# Patient Record
Sex: Male | Born: 1945
Health system: Southern US, Community
[De-identification: ages and names within clinical notes are randomized; demographics above are authoritative.]

## PROBLEM LIST (undated history)

## (undated) DIAGNOSIS — R972 Elevated prostate specific antigen [PSA]: Secondary | ICD-10-CM

## (undated) DIAGNOSIS — M1990 Unspecified osteoarthritis, unspecified site: Secondary | ICD-10-CM

## (undated) DIAGNOSIS — R002 Palpitations: Secondary | ICD-10-CM

## (undated) DIAGNOSIS — R001 Bradycardia, unspecified: Secondary | ICD-10-CM

## (undated) DIAGNOSIS — K449 Diaphragmatic hernia without obstruction or gangrene: Secondary | ICD-10-CM

## (undated) DIAGNOSIS — Z9889 Other specified postprocedural states: Secondary | ICD-10-CM

## (undated) DIAGNOSIS — K219 Gastro-esophageal reflux disease without esophagitis: Secondary | ICD-10-CM

## (undated) DIAGNOSIS — I209 Angina pectoris, unspecified: Secondary | ICD-10-CM

## (undated) DIAGNOSIS — I82409 Acute embolism and thrombosis of unspecified deep veins of unspecified lower extremity: Secondary | ICD-10-CM

## (undated) DIAGNOSIS — E785 Hyperlipidemia, unspecified: Secondary | ICD-10-CM

## (undated) DIAGNOSIS — D649 Anemia, unspecified: Secondary | ICD-10-CM

## (undated) DIAGNOSIS — R112 Nausea with vomiting, unspecified: Secondary | ICD-10-CM

## (undated) DIAGNOSIS — R51 Headache: Secondary | ICD-10-CM

## (undated) DIAGNOSIS — C801 Malignant (primary) neoplasm, unspecified: Secondary | ICD-10-CM

## (undated) DIAGNOSIS — IMO0001 Reserved for inherently not codable concepts without codable children: Secondary | ICD-10-CM

## (undated) DIAGNOSIS — L299 Pruritus, unspecified: Secondary | ICD-10-CM

## (undated) DIAGNOSIS — I2699 Other pulmonary embolism without acute cor pulmonale: Secondary | ICD-10-CM

## (undated) DIAGNOSIS — I341 Nonrheumatic mitral (valve) prolapse: Secondary | ICD-10-CM

## (undated) DIAGNOSIS — I2510 Atherosclerotic heart disease of native coronary artery without angina pectoris: Secondary | ICD-10-CM

## (undated) DIAGNOSIS — R519 Headache, unspecified: Secondary | ICD-10-CM

## (undated) DIAGNOSIS — Z8679 Personal history of other diseases of the circulatory system: Secondary | ICD-10-CM

## (undated) DIAGNOSIS — I10 Essential (primary) hypertension: Secondary | ICD-10-CM

## (undated) HISTORY — DX: Hyperlipidemia, unspecified: E78.5

## (undated) HISTORY — DX: Diaphragmatic hernia without obstruction or gangrene: K44.9

## (undated) HISTORY — DX: Nonrheumatic mitral (valve) prolapse: I34.1

## (undated) HISTORY — PX: TONSILLECTOMY AND ADENOIDECTOMY: SUR1326

## (undated) HISTORY — DX: Pruritus, unspecified: L29.9

## (undated) HISTORY — DX: Elevated prostate specific antigen (PSA): R97.20

## (undated) HISTORY — DX: Palpitations: R00.2

## (undated) HISTORY — DX: Anemia, unspecified: D64.9

## (undated) HISTORY — PX: CYSTOSCOPY: SUR368

## (undated) HISTORY — PX: OTHER SURGICAL HISTORY: SHX169

## (undated) HISTORY — PX: SHOULDER ARTHROSCOPY: SHX128

## (undated) HISTORY — DX: Gastro-esophageal reflux disease without esophagitis: K21.9

## (undated) HISTORY — DX: Bradycardia, unspecified: R00.1

## (undated) HISTORY — DX: Personal history of other diseases of the circulatory system: Z86.79

## (undated) HISTORY — DX: Atherosclerotic heart disease of native coronary artery without angina pectoris: I25.10

## (undated) HISTORY — PX: COLONOSCOPY: SHX174

---

## 1999-04-20 ENCOUNTER — Ambulatory Visit (HOSPITAL_COMMUNITY): Admission: RE | Admit: 1999-04-20 | Discharge: 1999-04-20 | Payer: Self-pay | Admitting: Gastroenterology

## 1999-05-12 ENCOUNTER — Encounter: Payer: Self-pay | Admitting: Internal Medicine

## 1999-05-12 ENCOUNTER — Ambulatory Visit (HOSPITAL_COMMUNITY): Admission: RE | Admit: 1999-05-12 | Discharge: 1999-05-12 | Payer: Self-pay | Admitting: Internal Medicine

## 2000-02-18 ENCOUNTER — Ambulatory Visit (HOSPITAL_COMMUNITY): Admission: RE | Admit: 2000-02-18 | Discharge: 2000-02-18 | Payer: Self-pay

## 2000-05-28 ENCOUNTER — Ambulatory Visit (HOSPITAL_COMMUNITY): Admission: RE | Admit: 2000-05-28 | Discharge: 2000-05-28 | Payer: Self-pay

## 2001-03-20 ENCOUNTER — Encounter: Admission: RE | Admit: 2001-03-20 | Discharge: 2001-03-20 | Payer: Self-pay | Admitting: Gastroenterology

## 2001-03-20 ENCOUNTER — Encounter: Payer: Self-pay | Admitting: Gastroenterology

## 2001-03-27 ENCOUNTER — Ambulatory Visit (HOSPITAL_COMMUNITY): Admission: RE | Admit: 2001-03-27 | Discharge: 2001-03-27 | Payer: Self-pay | Admitting: Gastroenterology

## 2001-03-27 ENCOUNTER — Encounter (INDEPENDENT_AMBULATORY_CARE_PROVIDER_SITE_OTHER): Payer: Self-pay | Admitting: *Deleted

## 2001-08-21 ENCOUNTER — Encounter (INDEPENDENT_AMBULATORY_CARE_PROVIDER_SITE_OTHER): Payer: Self-pay | Admitting: Specialist

## 2001-08-21 ENCOUNTER — Ambulatory Visit (HOSPITAL_COMMUNITY): Admission: RE | Admit: 2001-08-21 | Discharge: 2001-08-21 | Payer: Self-pay | Admitting: Gastroenterology

## 2001-09-19 ENCOUNTER — Ambulatory Visit (HOSPITAL_COMMUNITY): Admission: RE | Admit: 2001-09-19 | Discharge: 2001-09-19 | Payer: Self-pay | Admitting: Cardiology

## 2001-09-19 ENCOUNTER — Encounter: Payer: Self-pay | Admitting: Cardiology

## 2002-06-10 ENCOUNTER — Encounter: Payer: Self-pay | Admitting: Internal Medicine

## 2002-06-10 ENCOUNTER — Ambulatory Visit (HOSPITAL_COMMUNITY): Admission: RE | Admit: 2002-06-10 | Discharge: 2002-06-10 | Payer: Self-pay | Admitting: Internal Medicine

## 2003-05-13 HISTORY — PX: HUMERUS FRACTURE SURGERY: SHX670

## 2004-09-02 ENCOUNTER — Ambulatory Visit (HOSPITAL_COMMUNITY): Admission: RE | Admit: 2004-09-02 | Discharge: 2004-09-02 | Payer: Self-pay | Admitting: Gastroenterology

## 2004-10-26 ENCOUNTER — Ambulatory Visit: Payer: Self-pay | Admitting: Cardiology

## 2004-11-02 ENCOUNTER — Ambulatory Visit: Payer: Self-pay | Admitting: Cardiology

## 2004-11-02 ENCOUNTER — Inpatient Hospital Stay (HOSPITAL_COMMUNITY): Admission: AD | Admit: 2004-11-02 | Discharge: 2004-11-04 | Payer: Self-pay | Admitting: *Deleted

## 2004-11-02 ENCOUNTER — Ambulatory Visit: Payer: Self-pay

## 2004-11-03 ENCOUNTER — Ambulatory Visit: Payer: Self-pay | Admitting: *Deleted

## 2004-11-09 ENCOUNTER — Ambulatory Visit: Payer: Self-pay

## 2004-11-30 ENCOUNTER — Ambulatory Visit: Payer: Self-pay | Admitting: Cardiology

## 2005-01-17 ENCOUNTER — Ambulatory Visit: Payer: Self-pay | Admitting: Cardiology

## 2005-02-01 ENCOUNTER — Ambulatory Visit: Payer: Self-pay | Admitting: Cardiology

## 2005-03-03 ENCOUNTER — Ambulatory Visit: Payer: Self-pay | Admitting: Cardiology

## 2005-03-22 ENCOUNTER — Ambulatory Visit: Payer: Self-pay | Admitting: Cardiology

## 2005-05-24 ENCOUNTER — Ambulatory Visit: Payer: Self-pay | Admitting: Internal Medicine

## 2005-06-21 ENCOUNTER — Ambulatory Visit: Payer: Self-pay | Admitting: Cardiology

## 2005-07-12 ENCOUNTER — Ambulatory Visit: Payer: Self-pay | Admitting: Cardiology

## 2005-10-04 ENCOUNTER — Ambulatory Visit: Payer: Self-pay | Admitting: Cardiology

## 2005-10-25 ENCOUNTER — Encounter: Admission: RE | Admit: 2005-10-25 | Discharge: 2005-10-25 | Payer: Self-pay | Admitting: Gastroenterology

## 2006-01-03 ENCOUNTER — Ambulatory Visit (HOSPITAL_COMMUNITY): Admission: RE | Admit: 2006-01-03 | Discharge: 2006-01-03 | Payer: Self-pay | Admitting: Gastroenterology

## 2006-01-03 ENCOUNTER — Encounter (INDEPENDENT_AMBULATORY_CARE_PROVIDER_SITE_OTHER): Payer: Self-pay | Admitting: Specialist

## 2006-02-28 ENCOUNTER — Ambulatory Visit: Payer: Self-pay | Admitting: Cardiology

## 2006-08-01 ENCOUNTER — Ambulatory Visit: Payer: Self-pay | Admitting: Cardiology

## 2007-02-13 ENCOUNTER — Ambulatory Visit: Payer: Self-pay | Admitting: Cardiology

## 2007-02-13 LAB — CONVERTED CEMR LAB
BUN: 14 mg/dL (ref 6–23)
Basophils Absolute: 0.1 10*3/uL (ref 0.0–0.1)
Basophils Relative: 1.8 % — ABNORMAL HIGH (ref 0.0–1.0)
CO2: 31 meq/L (ref 19–32)
Chloride: 108 meq/L (ref 96–112)
Eosinophils Absolute: 0.3 10*3/uL (ref 0.0–0.6)
Glucose, Bld: 98 mg/dL (ref 70–99)
HCT: 38.3 % — ABNORMAL LOW (ref 39.0–52.0)
Hemoglobin: 13.2 g/dL (ref 13.0–17.0)
Homocysteine: 11.4 micromoles/L (ref 5.00–13.90)
Monocytes Relative: 8.9 % (ref 3.0–11.0)
Neutro Abs: 3.3 10*3/uL (ref 1.4–7.7)
Neutrophils Relative %: 65.9 % (ref 43.0–77.0)
Platelets: 238 10*3/uL (ref 150–400)
Potassium: 3.9 meq/L (ref 3.5–5.1)
RBC: 4.38 M/uL (ref 4.22–5.81)
RDW: 12.5 % (ref 11.5–14.6)
Total Bilirubin: 0.8 mg/dL (ref 0.3–1.2)

## 2007-02-20 ENCOUNTER — Ambulatory Visit: Payer: Self-pay | Admitting: Cardiology

## 2007-03-01 ENCOUNTER — Ambulatory Visit: Payer: Self-pay | Admitting: Cardiology

## 2007-03-01 LAB — CONVERTED CEMR LAB
BUN: 15 mg/dL (ref 6–23)
Basophils Absolute: 0 10*3/uL (ref 0.0–0.1)
CO2: 31 meq/L (ref 19–32)
Eosinophils Relative: 4.4 % (ref 0.0–5.0)
GFR calc Af Amer: 79 mL/min
Glucose, Bld: 108 mg/dL — ABNORMAL HIGH (ref 70–99)
HCT: 39.2 % (ref 39.0–52.0)
Hemoglobin: 13.6 g/dL (ref 13.0–17.0)
INR: 0.9 (ref 0.8–1.0)
Lymphocytes Relative: 17 % (ref 12.0–46.0)
MCV: 86.6 fL (ref 78.0–100.0)
Monocytes Relative: 10.7 % (ref 3.0–11.0)
Neutro Abs: 3.5 10*3/uL (ref 1.4–7.7)
Neutrophils Relative %: 67.8 % (ref 43.0–77.0)
Potassium: 3.8 meq/L (ref 3.5–5.1)
Prothrombin Time: 11.4 s (ref 10.9–13.3)
Sodium: 144 meq/L (ref 135–145)
aPTT: 28.4 s (ref 21.7–29.8)

## 2007-03-06 ENCOUNTER — Ambulatory Visit: Payer: Self-pay | Admitting: Cardiology

## 2007-03-06 ENCOUNTER — Inpatient Hospital Stay (HOSPITAL_BASED_OUTPATIENT_CLINIC_OR_DEPARTMENT_OTHER): Admission: RE | Admit: 2007-03-06 | Discharge: 2007-03-06 | Payer: Self-pay | Admitting: Cardiology

## 2007-04-03 ENCOUNTER — Ambulatory Visit: Payer: Self-pay | Admitting: Cardiology

## 2007-05-15 ENCOUNTER — Encounter: Admission: RE | Admit: 2007-05-15 | Discharge: 2007-05-15 | Payer: Self-pay | Admitting: Gastroenterology

## 2007-05-15 ENCOUNTER — Encounter: Payer: Self-pay | Admitting: Internal Medicine

## 2007-05-22 ENCOUNTER — Encounter: Payer: Self-pay | Admitting: Internal Medicine

## 2007-05-22 ENCOUNTER — Ambulatory Visit (HOSPITAL_COMMUNITY): Admission: RE | Admit: 2007-05-22 | Discharge: 2007-05-22 | Payer: Self-pay | Admitting: Gastroenterology

## 2007-05-29 ENCOUNTER — Encounter: Payer: Self-pay | Admitting: Internal Medicine

## 2007-10-23 ENCOUNTER — Ambulatory Visit: Payer: Self-pay | Admitting: Cardiology

## 2007-12-18 ENCOUNTER — Encounter: Payer: Self-pay | Admitting: Internal Medicine

## 2008-06-24 ENCOUNTER — Ambulatory Visit: Payer: Self-pay | Admitting: Cardiology

## 2008-06-24 LAB — CONVERTED CEMR LAB
ALT: 31 units/L (ref 0–53)
Albumin: 4.1 g/dL (ref 3.5–5.2)
Bilirubin, Direct: 0.1 mg/dL (ref 0.0–0.3)
CRP, High Sensitivity: <1 — ABNORMAL LOW (ref 0.00–5.00)
Calcium: 9.3 mg/dL (ref 8.4–10.5)
Chloride: 105 meq/L (ref 96–112)
Eosinophils Absolute: 0.2 10*3/uL (ref 0.0–0.7)
Eosinophils Relative: 4.8 % (ref 0.0–5.0)
GFR calc Af Amer: 87 mL/min
GFR calc non Af Amer: 72 mL/min
HCT: 38.2 % — ABNORMAL LOW (ref 39.0–52.0)
LDL Cholesterol: 64 mg/dL (ref 0–99)
MCHC: 34.2 g/dL (ref 30.0–36.0)
Monocytes Relative: 8.3 % (ref 3.0–12.0)
PSA: 4.68 ng/mL — ABNORMAL HIGH (ref 0.10–4.00)
Platelets: 214 10*3/uL (ref 150–400)
RBC: 4.36 M/uL (ref 4.22–5.81)
Sodium: 141 meq/L (ref 135–145)
Total Protein: 6 g/dL (ref 6.0–8.3)
WBC: 4.5 10*3/uL (ref 4.5–10.5)

## 2008-07-01 ENCOUNTER — Ambulatory Visit (HOSPITAL_COMMUNITY): Admission: RE | Admit: 2008-07-01 | Discharge: 2008-07-01 | Payer: Self-pay | Admitting: Gastroenterology

## 2008-07-01 ENCOUNTER — Encounter (INDEPENDENT_AMBULATORY_CARE_PROVIDER_SITE_OTHER): Payer: Self-pay | Admitting: Gastroenterology

## 2008-07-01 ENCOUNTER — Encounter: Payer: Self-pay | Admitting: Internal Medicine

## 2008-09-03 ENCOUNTER — Encounter: Payer: Self-pay | Admitting: Cardiology

## 2008-10-21 ENCOUNTER — Encounter: Payer: Self-pay | Admitting: Internal Medicine

## 2008-11-07 DIAGNOSIS — Z9089 Acquired absence of other organs: Secondary | ICD-10-CM | POA: Insufficient documentation

## 2008-11-07 DIAGNOSIS — I059 Rheumatic mitral valve disease, unspecified: Secondary | ICD-10-CM | POA: Insufficient documentation

## 2008-11-07 DIAGNOSIS — K219 Gastro-esophageal reflux disease without esophagitis: Secondary | ICD-10-CM | POA: Insufficient documentation

## 2008-11-07 DIAGNOSIS — Z862 Personal history of diseases of the blood and blood-forming organs and certain disorders involving the immune mechanism: Secondary | ICD-10-CM | POA: Insufficient documentation

## 2008-11-07 DIAGNOSIS — J309 Allergic rhinitis, unspecified: Secondary | ICD-10-CM | POA: Insufficient documentation

## 2008-11-07 DIAGNOSIS — Z8679 Personal history of other diseases of the circulatory system: Secondary | ICD-10-CM | POA: Insufficient documentation

## 2008-11-07 DIAGNOSIS — E785 Hyperlipidemia, unspecified: Secondary | ICD-10-CM | POA: Insufficient documentation

## 2008-11-10 DIAGNOSIS — I2510 Atherosclerotic heart disease of native coronary artery without angina pectoris: Secondary | ICD-10-CM | POA: Insufficient documentation

## 2008-11-10 DIAGNOSIS — I209 Angina pectoris, unspecified: Secondary | ICD-10-CM | POA: Insufficient documentation

## 2008-11-11 ENCOUNTER — Ambulatory Visit: Payer: Self-pay | Admitting: Cardiology

## 2009-08-18 ENCOUNTER — Encounter: Payer: Self-pay | Admitting: Internal Medicine

## 2009-08-18 ENCOUNTER — Ambulatory Visit (HOSPITAL_COMMUNITY): Admission: RE | Admit: 2009-08-18 | Discharge: 2009-08-18 | Payer: Self-pay | Admitting: Gastroenterology

## 2009-08-27 ENCOUNTER — Encounter: Payer: Self-pay | Admitting: Internal Medicine

## 2009-08-27 ENCOUNTER — Ambulatory Visit (HOSPITAL_COMMUNITY): Admission: RE | Admit: 2009-08-27 | Discharge: 2009-08-27 | Payer: Self-pay | Admitting: Gastroenterology

## 2009-11-16 ENCOUNTER — Telehealth: Payer: Self-pay | Admitting: Cardiology

## 2010-01-26 ENCOUNTER — Ambulatory Visit: Payer: Self-pay | Admitting: Cardiology

## 2010-01-27 ENCOUNTER — Encounter: Payer: Self-pay | Admitting: Cardiology

## 2010-07-03 ENCOUNTER — Encounter: Payer: Self-pay | Admitting: Gastroenterology

## 2010-07-08 ENCOUNTER — Ambulatory Visit
Admission: RE | Admit: 2010-07-08 | Discharge: 2010-07-08 | Payer: Self-pay | Source: Home / Self Care | Attending: Internal Medicine | Admitting: Internal Medicine

## 2010-07-08 ENCOUNTER — Encounter: Payer: Self-pay | Admitting: Internal Medicine

## 2010-07-08 ENCOUNTER — Other Ambulatory Visit: Payer: Self-pay | Admitting: Internal Medicine

## 2010-07-08 LAB — LIPID PANEL
Cholesterol: 135 mg/dL (ref 0–200)
HDL: 48.1 mg/dL (ref >39.00)
VLDL: 11.6 mg/dL (ref 0.0–40.0)

## 2010-07-08 LAB — CBC WITH DIFFERENTIAL/PLATELET
Basophils Relative: 0 % (ref 0.0–3.0)
Eosinophils Absolute: 0 10*3/uL (ref 0.0–0.7)
Eosinophils Relative: 0 % (ref 0.0–5.0)
Lymphs Abs: 0.6 10*3/uL — ABNORMAL LOW (ref 0.7–4.0)
MCHC: 33.3 g/dL (ref 30.0–36.0)
MCV: 89.8 fl (ref 78.0–100.0)
Monocytes Absolute: 0.6 10*3/uL (ref 0.1–1.0)
Platelets: 241 10*3/uL (ref 150.0–400.0)
RBC: 4.52 Mil/uL (ref 4.22–5.81)
RDW: 13.7 % (ref 11.5–14.6)

## 2010-07-08 LAB — BASIC METABOLIC PANEL
CO2: 26 mEq/L (ref 19–32)
Calcium: 9.5 mg/dL (ref 8.4–10.5)

## 2010-07-11 LAB — CONVERTED CEMR LAB: PSA: 1.41 ng/mL (ref <=4.00)

## 2010-07-14 NOTE — Assessment & Plan Note (Signed)
Summary: per check out  Medications Added FLOMAX 0.4 MG CAPS (TAMSULOSIN HCL) 1 tab once daily      Allergies Added:   Visit Type:  Follow-up Primary Provider:  norins  CC:  occ chest pain.  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  he has very mild nonobstructive CAD documented at catheterization a few years ago with 30% narrowing in the proximal LAD. He has fairly typical exertional chest pain. When he walks on a treadmill he knows just what speed and incline will bring on pain and he generally keeps his level of activity just below this. He does not feel his symptoms have changed over the past year they have gotten a bit better. He's going to adjust his symptoms very well and does not appear to be limited in the things that he wants to do.  He also has hyperlipidemia with low HDL. In January of 2010 his HDL was 28.  He's been intolerant to nitrates beta blockers and calcium channel blockers. We have discussed Ranexa but because his symptoms are not disabling we have not decided to use this.  He has a son who will be entering medical school in [facility] [employer] [facility] next year.  Current Medications (verified): 1)  Vytorin 10-40 Mg Tabs (Ezetimibe-Simvastatin) .... Take One Tablet By Mouth Dailyat Bedtime 2)  Allegra 180 Mg Tabs (Fexofenadine Hcl) 3)  Zegerid 40-1100 Mg Caps (Omeprazole-Sodium Bicarbonate) .Marland Kitchen.. 1 Tab Two Times A Day 4)  Prozac 10 Mg Tabs (Fluoxetine Hcl) .Marland Kitchen.. 1 Tab Once Daily 5)  Flomax 0.4 Mg Caps (Tamsulosin Hcl) .Marland Kitchen.. 1 Tab Once Daily  Allergies (verified): 1)  ! Pcn  Past History:  Past Medical History: Reviewed history from 11/10/2008 and no changes required. * MICROVASCULAR ANGINA GERD (ICD-530.81) PRURITUS, HX OF (ICD-698.9) RHEUMATIC FEVER, HX OF QUESTION (ICD-V12.59) PSA, INCREASED, HX OF (ICD-790.93) ANEMIA, HX OF (ICD-V12.3) ALLERGIC RHINITIS (ICD-477.9) PALPITATIONS, HX OF  (ICD-V12.50) MITRAL VALVE PROLAPSE (ICD-424.0) HIATAL HERNIA, HX OF SMALL (ICD-V12.79) HYPERLIPIDEMIA (ICD-272.4) 1. Microvascular angina, improved. 2. Minimal nonobstructive coronary disease. 3. Good left ventricular function. 4. Hyperlipidemia with low HDL and small LDL particle size.     Review of Systems       ROS is negative except as outlined in HPI.   Vital Signs:  Patient profile:   65 year old male Height:      68 inches Weight:      181 pounds BMI:     27.62 Pulse rate:   53 / minute BP sitting:   119 / 77  (left arm) Cuff size:   regular  Vitals Entered By: Burnett Kanaris, CNA (January 26, 2010 4:15 PM)  Physical Exam  Additional Exam:  Gen. Well-nourished, in no distress   Neck: No JVD, thyroid not enlarged, no carotid bruits Lungs: No tachypnea, clear without rales, rhonchi or wheezes Cardiovascular: Rhythm regular, PMI not displaced,  heart sounds  normal, no murmurs or gallops, no peripheral edema, pulses normal in all 4 extremities. Abdomen: BS normal, abdomen soft and non-tender without masses or organomegaly, no hepatosplenomegaly. MS: No deformities, no cyanosis or clubbing   Neuro:  No focal sns   Skin:  no lesions    Impression & Recommendations:  Problem # 1:  OTHER AND UNSPECIFIED ANGINA PECTORIS (ICD-413.9) He has microvascular angina.  Sx stable and not disabling.  No change in Rx. He has dyspnea with exertion as well as CP and mild edema  LE.  Will get d-dimer.  Problem # 2:  HYPERLIPIDEMIA (ICD-272.4) He has low HDL.  Will get lipid profile. His updated medication list for this problem includes:    Vytorin 10-40 Mg Tabs (Ezetimibe-simvastatin) .Marland Kitchen... Take one tablet by mouth dailyat bedtime  Problem # 3:  CAD, NATIVE VESSEL (ICD-414.01) He has mild non-obstructive disease.  Patient Instructions: 1)  Your physician recommends that you return for FASTING lab work on a day that is good for you: cbc/bmet/lipid/liver/PSA (bound & free)/ d-dimer  (414.01;272.2) 2)  Your physician recommends that you continue on your current medications as directed. Please refer to the Current Medication list given to you today. 3)  Your physician wants you to follow-up in: 1 year with Dr. Gala Romney. You will receive a reminder letter in the mail two months in advance. If you don't receive a letter, please call our office to schedule the follow-up appointment.

## 2010-07-14 NOTE — Progress Notes (Signed)
Summary: med refill done x2    Phone Note Refill Request Message from:  Patient on November 16, 2009 9:18 AM  Refills Requested: Medication #1:  VYTORIN 10-40 MG TABS Take one tablet by mouth dailyat bedtime Please resend to [facility] pt states [facility] didnt get it   Method Requested: Fax to [facility] Initial call taken by: Edman Circle,  November 16, 2009 9:18 AM    Prescriptions: VYTORIN 10-40 MG TABS (EZETIMIBE-SIMVASTATIN) Take one tablet by mouth dailyat bedtime  #90 x 4   Entered by:   Burnett Kanaris, CNA   Authorized by:   Lenoria Farrier, MD, [facility]   Signed by:   Burnett Kanaris, CNA on 11/16/2009   Method used:   Electronically to        [facility]* (mail-order)             ,          Ph: [phone]       Fax: [phone]   RxID:   [id number]

## 2010-07-14 NOTE — Letter (Signed)
Summary: [facility]   Imported By: Sherian Rein 08/30/2009 09:59:34  _____________________________________________________________________  External Attachment:    Type:   Image     Comment:   External Document

## 2010-07-27 ENCOUNTER — Other Ambulatory Visit: Payer: Self-pay

## 2010-08-15 ENCOUNTER — Telehealth (INDEPENDENT_AMBULATORY_CARE_PROVIDER_SITE_OTHER): Payer: Self-pay | Admitting: *Deleted

## 2010-08-23 NOTE — Progress Notes (Signed)
Summary: vytorin refill   Phone Note Other Incoming   Caller: patient Summary of Call: J Maricruz Lucero RN  Follow-up for Phone Call        Patient called and asked if we could renew his Vytorin to the [facility]. Will send in script. He was concerned about his follow up since Dr.Brodie retired and he was assigned to Dr.Dan who will be working in the CHF clinic at [employer]. He would like to be assigned to Dr. Excell Seltzer for his yearly visit in August . Dr.Dan is OK with this. Will ask [facility] and call patient back at [phone].  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 Will change call back to Dr.Cooper for August.   Layne Benton, RN, BSN  August 17, 2010 12:45 PM     Prescriptions: VYTORIN 10-40 MG TABS (EZETIMIBE-SIMVASTATIN) Take one tablet by mouth dailyat bedtime  #90 x 3   Entered by:   Layne Benton, RN, BSN   Authorized by:   Dolores Patty, MD, [facility]   Signed by:   Layne Benton, RN, BSN on 08/15/2010   Method used:   Electronically to        [facility]* (retail)       [address]       [address] Shipping/mailing       [place], [place]  [address]       Ph: [phone]       Fax: [phone]   RxID:   [id number]

## 2010-10-25 NOTE — Op Note (Signed)
NAME:  Nathaniel [name], HEACOCK NO.:  [id number]   MEDICAL RECORD NO.:  [id number]          PATIENT TYPE:  AMB   LOCATION:  ENDO                         FACILITY:  [facility]   PHYSICIAN:  [name], M.D.   DATE OF BIRTH:  1945/10/22   DATE OF PROCEDURE:  07/01/2008  DATE OF DISCHARGE:                               OPERATIVE REPORT   PROCEDURE:  Upper endoscopy with biopsies.   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  He has a fair amount of LPR symptoms such as throat  clearing and intermittent hoarseness, but also a new sensation of a  burning sensation coming up into his proximal esophagus or throat, even  though he is not having overt mechanical regurgitation.   FINDINGS:  Probable moderate reflux laryngitis.  Esophageal mucosa  intact, but questionable short-segment Barrett esophagus versus small  hiatal hernia.   PROCEDURE:  The nature, purpose, and risks of the procedure were  familiar to the patient from prior examinations and he provided written  consent.  Sedation was Demerol 75 mg and Versed 5 mg IV without clinical  instability.  Note that Demerol was used instead of fentanyl because he  has a history of nausea and vomiting after exposure to fentanyl in the  past, and has done well with Demerol for endoscopic procedures  previously.   The Pentax video endoscope was passed under direct vision.  The larynx  looked somewhat abnormal, and there did appear to be edema of the  arytenoid cartilages and posterior commissure.   The esophagus was entered easily under direct vision.   The esophageal mucosa was normal all the way down to the squamocolumnar  junction, below which was what appeared to be a small (2 cm)  hiatal  hernia.  Gastric folds seemed to have continuity into this area  suggesting it was a hiatal hernia, but there was some very slight  irregularity to the Z-line raising the question as to whether or not  this could reflect short-segment Barrett esophagus, so I took biopsies  circumferentially just below the squamocolumnar junction.   No esophageal ring or stricture was evident at this time and I did not  see any free reflux occurring during this exam.   The stomach was entered.  It contained no residual.  There was some  faint prepyloric antral erythema without any erosive changes nor any  ulcers, masses, polyps, or vascular ectasia anywhere in the stomach  including a retroflex view of the cardia, which did not show any obvious  hiatal hernia.  The pylorus, duodenal bulb, and second duodenum looked  normal.   The scope was then removed from the patient who tolerated the procedure  well and without any apparent complication.   IMPRESSION:  1. Probable reflux laryngitis.  2. No active esophagitis noted.  3. Possible short-segment Barrett esophagus versus small hiatal      hernia.  PLAN:  Await pathology results to see if they clarify the picture  histologically as to whether or not this patient has Barrett esophagus.  At some point, the patient may need consideration of 24-hour impedance  monitoring, 48-hour intra-esophageal pH monitoring while on medication,  and/or referral to a medical center.           ______________________________  [name], M.D.     RB/MEDQ  D:  07/01/2008  T:  07/01/2008  Job:  [id number]   cc:   Rosalyn Gess. Norins, MD

## 2010-10-25 NOTE — Assessment & Plan Note (Signed)
[facility]                            CARDIOLOGY OFFICE NOTE   NAME:[name], [name]                    MRN:          [id number]  DATE:10/23/2007                            DOB:          1945/07/01    PRIMARY CARE PHYSICIAN:  Nathaniel Gess. 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  He had increasing symptoms of chest pain last  fall and we brought him in for catheterization, but his anatomy had not  changed.  His about 30% plaque in the LAD with some systolic  compression.  He does have TIMI II flow in the right coronary artery.  He says he has been doing fairly well.  He does have angina if he lifts  and walks and exerts himself too much, but this has not interfered too  much with his daily activities.  We have tried him on a number of  medications including calcium channel blockers and beta blockers and  nitrates, but he has not been able to tolerate these.  We have  considered Ranexa, but he prefers to hold off.   PAST MEDICAL HISTORY:  Hyperlipidemia with a borderline low HDL and  small particle size.   CURRENT MEDICATIONS:  1. Prozac.  2. Vytorin.  3. Zegerid.  4. He is not on aspirin due to problems with reflux.   PHYSICAL EXAMINATION:  Blood pressure was 102/60 and the pulse 51 and  regular.  There was no venous distension.  The carotid pulses were full  without bruits.  CHEST:  Was clear.  CARDIAC:  Rhythm was regular.  I could hear no murmurs or gallops.  ABDOMEN:  Soft without organomegaly.  Peripheral pulses were full and there was no peripheral edema.   IMPRESSION:  1. Microvascular angina, improved.  2. Minimal nonobstructive coronary disease.  3. Good left ventricular function.  4. Hyperlipidemia with low HDL and small LDL particle size.   RECOMMENDATIONS:  Nathaniel [name] appears to be doing fairly well and his symptoms  are not very disabling.  We will not make any  changes in his  medications.  Will get the fasting lipid and liver profile.  I will plan  to see back in follow-up in the year.     Nathaniel Elvera Lennox Juanda Chance, MD, [facility]  Electronically Signed   BRB/MedQ  DD: 10/23/2007  DT: 10/23/2007  Job #: [id number]

## 2010-10-25 NOTE — Cardiovascular Report (Signed)
Nathaniel [name], [name] NO.:  [id number]   MEDICAL RECORD NO.:  [id number]          PATIENT TYPE:  OIB   LOCATION:  1963                         FACILITY:  [facility]   PHYSICIAN:  Nathaniel Beals. Juanda Chance, MD, FACCDATE OF BIRTH:  [birth year]   DATE OF PROCEDURE:  03/06/2007  DATE OF DISCHARGE:                            CARDIAC CATHETERIZATION   HISTORY:  Nathaniel [name] is 65 years old and has a history of what is felt  to be microvascular angina.  He underwent catheterization by Dr.  Gerri [name] in May 2006 at which time he had a myocardial bridging in the  proximal LAD with compression to 40-50% and minimal plaque documented by  IVUS proximal to the area for systolic compression.  He subsequently had  evaluation by Dr. Bernette [name] at [facility] and had an exercise MRI which  showed no evidence of ischemia and specifically, no evidence of ischemia  in the distribution of the LAD.  Based on these findings, he is felt to  have microvascular angina.  Therapy has been limited by a slow heart  rate which has precluded beta blockers and an intolerance of nitrates  due to headache and nonresponsiveness to calcium channel blockers.  Recently his symptoms increased in severity and they occurred with very  minimal exertion.  We made a decision to reevaluate him with  angiography.  We considered CT angiography, but he was concerned about  the radiation so we decided on conventional angiography to rule out  development of obstructive coronary disease.   PROCEDURE:  The procedure was performed via the right femoral artery and  arterial sheath and 4-French preformed coronary catheters.  A front wall  arterial puncture was performed and Omnipaque contrast was used.  The  patient did have chest discomfort with the contrast injections.  The  nitroglycerin was given intracoronary to further evaluate the LAD  bridging.  The patient tolerated the procedure well and left the  laboratory in satisfactory  condition.  He received 1 mg of Versed at the  beginning of the procedure and 2 mg of morphine at the end of the  procedure.   RESULTS:  Hemodynamic data:  The aortic pressure was 116/64 with a mean  of 86.  The left ventricular pressure is 116/13.   Left main coronary main free of significant disease.   Left anterior descending artery gave rise to a large diagonal branch  with two sub-branches and two septal perforators.  There was mild 40%  narrowing in one of the sub branches of the diagonal branch.  There was  30% narrowing in the proximal LAD.  There was systolic bridging in the  proximal LAD just distal to the mild plaque which narrows the lumen to  about 40% during systole.  The distal vessel is free of major  obstruction.   The circumflex artery gave rise to two marginal branches and a large and  small posterolateral branch.  These vessels were free of significant  disease.   The right coronary artery was a moderately large vessel, gave rise to a  conus branch, two right ventricular branches, a posterior  descending  branch, and two posterolateral branches.  This vessel is free of  significant disease but there was TIMI-2 flow in the vessel.  It took 4  cycles for the vessel to completely fill and it appeared that the  myocardial blush did not wash out rapidly.   The left ventriculography was done in the RAO projection and showed good  wall motion with no areas of hypokinesis.  Estimated ejection fraction  was 55%..   CONCLUSION:  Mild nonobstructive coronary artery disease with 30 cm  narrowing in the proximal left anterior descending, 40% systolic  compression in the proximal left anterior descending, and TIMI-2 flow in  the right coronary with normal left ventricular function.   RECOMMENDATIONS:  The patient has minimal nonobstructive disease and I  do not think the systolic bridging is responsible for his symptoms.  I  think Nathaniel [name] meets all the criteria for the  diagnosis of microvascular  angina syndrome X.  I will discuss further treatment with him.  We may  reconsider with Renexa although he has had some reluctance to try this  in the past.  Will also consider referral back to Dr. Bernette [name] at  [facility].      Nathaniel Elvera Lennox Juanda Chance, MD, [facility]  Electronically Signed     BRB/MEDQ  D:  03/06/2007  T:  03/07/2007  Job:  [id number]   cc:   Nathaniel Gess. Norins, MD  Cardiopulmonary Lab

## 2010-10-25 NOTE — Assessment & Plan Note (Signed)
[facility]                            CARDIOLOGY OFFICE NOTE   NAME:Nathaniel [name], Nathaniel [name]                    MRN:          [id number]  DATE:04/03/2007                            DOB:          Nov 27, 1945    PRIMARY CARE PHYSICIAN:  Rosalyn Gess. Norins, MD.   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  He has microvascular angina and had  increasing symptoms recently and brought him in for catheterization.  His anatomy had not really changed.  He had a 30% plaque in the LAD and  40% systolic compression.  He did have TIMI-2 flow in the right coronary  artery and normal LV function.  Since his catheterization, his symptoms  have gotten better.  He still has symptoms if he pushes himself, but  some of the symptoms he had with minimal exertion have resolved.   PAST MEDICAL HISTORY:  1. Hyperlipidemia with a low HDL and small LDL particle size.  2. Borderline oxygen saturations of 93-95%.   CURRENT MEDICATIONS:  Prozac, Allegra, Vytorin, Zegerid.   PHYSICAL EXAMINATION:  VITAL SIGNS:  Blood pressure 117/76, pulse 56 and  regular.  NECK:  There was no venous distension.  Carotid pulses were full without  bruits.  CHEST:  Clear.  CARDIAC:  Regular, no murmurs, rubs or gallops.  ABDOMEN:  Soft, no organomegaly.  EXTREMITIES:  Peripheral pulses were full and there was no peripheral  edema.   IMPRESSION:  1. Microvascular angina, improved.  2. Minimal nonobstructive coronary artery disease as described above.  3. Good left ventricular function.  4. Hyperlipidemia with low HDL and small LDL particle size.   RECOMMENDATIONS:  [name] is having much less symptoms.  He had been  intolerant to nitrates and Norvasc had not been effective.  His pulse  rate has been low, so we have not used beta-blockers.  We considered  Ranexa and we gave him the literature on this, but since he is feeling  better, his  preference is to not start a new drug.  I think this is  reasonable since he is much less symptomatic now.  We had also talked  about a return visit with Dr. [name] at [facility] and also a visit or  phone call with the doctor in [place] who has had experience with  Ranexa in microvascular angina, but will hold off on both of these.  He  was somewhat concerned about his borderline oxygen saturations and I  suggested that he check his  saturations with activity which he can do in his office.  If he falls  below 93, then we may consider further evaluation.  Otherwise, I will  see him back in 4 months.     Bruce Elvera Lennox Juanda Chance, MD, [facility]  Electronically Signed    BRB/MedQ  DD: 04/03/2007  DT: 04/04/2007  Job #: [id number]

## 2010-10-25 NOTE — Assessment & Plan Note (Signed)
[facility]                            CARDIOLOGY OFFICE NOTE   NAME:Nathaniel [name], Nathaniel [name]                    MRN:          [id number]  DATE:02/20/2007                            DOB:          [birth year]    PRIMARY CARE PHYSICIAN:  Dr. Illene Regulus.   CLINICAL HISTORY:  Nathaniel [name] returned for followup visit regarding  his angina.  He has gotten much more symptomatic over the last 2-3  weeks.  He now has chest burning, pressure up into his neck with minimal  exertion such as walking and also with intercourse.  Before he would be  able to slow down and his symptoms would go away, but now he has to stop  completely and they come on with much less exertion.  Symptoms now  occurring daily with his activities of daily living.  He has had no rest  symptoms.   His last catheterization was in 2006 which showed some myocardial  bridging and just some minimal plaque by intervascular ultrasound.  He  has had Myoview scan which has shown ST changes but no evidence of  ischemia.  At [facility] he had an MR scan which showed no evidence  of ischemia.  He has not been under our treatment for this at present.  He has been intolerant to IMDUR, and Norvasc did not seem to help.  He  has had a resting bradycardia so beta blockers have not been used.   PAST MEDICAL HISTORY:  Known for hyperlipidemia with a low HDL, and LDL  particle size has been small.  He had a recent Lipomed panel which is  not back, but his CRP was normal.  Homocysteine was normal on folic  acid, although it had been elevated before he started folic acid.   CURRENT MEDICATIONS:  Prozac, Allegra, folic acid, Vytorin, and Zegerid.   PHYSICAL EXAMINATION:  VITAL SIGNS:  Blood pressure 102/56, pulse 52 and  regular.  NECK:  There was no venous distention.  Carotid pulses were full without  bruits.  CHEST:  Clear.  CARDIAC:  Rhythm was regular.  I could hear no murmurs or gallops.  ABDOMEN:  Soft without organomegaly.  EXTREMITIES:  Peripheral pulses were full.  There was no peripheral  edema.   Electrocardiogram was normal except for sinus bradycardia.   IMPRESSION:  1. Typical angina with increasing frequency probably related to      microvascular angina.  Rule out development of new obstructive      coronary disease.  2. 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  3. Dyslipoproteinemia with low HDL and small LDL particle size.   RECOMMENDATIONS:  I think Nathaniel [name]'s symptoms are most likely related to  microvascular angina, but the sudden change in symptoms makes me feel  that we should evaluate him first for obstructive coronary disease.  Will plan to arrange for him to have a CT angiogram.  I think he should  be a good candidate for this  because he has a slow pulse rate standing  and he had minimal plaque on his prior study.  I think Ranexa might be a  good choice, but I think Nathaniel [name] would be interested in a referral for  further evaluation of this.  I am considering referring him back to Dr.  Romeo Apple at [facility] for further evaluation.     Bruce Elvera Lennox Juanda Chance, MD, [facility]  Electronically Signed    BRB/MedQ  DD: 02/20/2007  DT: 02/21/2007  Job #: [id number]

## 2010-10-28 NOTE — Assessment & Plan Note (Signed)
[facility]                            CARDIOLOGY OFFICE NOTE   NAME:Nathaniel [name], Nathaniel [name]                    MRN:          [id number]  DATE:08/01/2006                            DOB:          Jun 03, 1946    PRIMARY CARE PHYSICIAN:  Dr. Illene Regulus.   CLINICAL HISTORY:  [name] returns for followup management of his  microvascular angina.  [name] has had cardiac catheterization that showed  nonobstructive disease in his LAD and some mild bridging, which was felt  not to be responsible for ischemia.  He has fairly typical exertional  chest discomfort which we have attributed to microvascular angina.  He  has also been seen in consultation by Dr. Romeo Apple at [facility],  who concurs with the diagnosis.   [name] has been doing reasonably well.  He still gets chest discomfort when  he hurries or walks up stairs, but he is not terribly disabled by this.  He is able to get through his work day without any problems.  He is more  limited in his other physical activities by back and orthopedic  problems.  He is able to kayak without too much trouble with chest  discomfort.  Norvasc has not seemed to have much effect on his angina,  and he DEVELOPED HEADACHES FROM IMDUR, and he has had slow resting heart  rates so was not given beta blockers.  We considered Renexa in the past,  but he has not been very disabled by his symptoms and we decided against  this.   PAST MEDICAL HISTORY:  Significant for hyperlipidemia with a low HDL.  He also has had small particle size.  He is currently on Vytorin and has  been tried on Niaspan, but could not tolerate that.   CURRENT MEDICATIONS:  Prozac, Allegra, Hydrocil, folic acid, Vytorin,  Zegerid and Norvasc.   PHYSICAL EXAMINATION:  Blood pressure is 116/71, the pulse is 54 and  regular.  NECK:  There was no venous distention.  The carotid pulses were full  without bruits.  CHEST:  Clear without rales or rhonchi.  CARDIAC:  Rhythm was regular.  I can hear no murmurs or gallops.  ABDOMEN:  Soft without organomegaly.  EXTREMITIES:  Peripheral pulses are full, and there is no peripheral  edema.   IMPRESSION:  1. Microvascular angina.  2. Mild nonobstructive coronary disease with mild bridging of the left      anterior descending artery.  3. Dyslipoproteinemia with low HDL.   RECOMMENDATIONS:  [name] is not very disabled by his symptoms so I do not  think it would be helpful to try and push antianginal therapy.  His last  lipid profile still had small particle size and moderately low HDL.  We  will plan to repeat a lipo med panel, and we will also get a  homocystine and CRP.  I will plan to see him back in followup in 6  months.  We will be in touch with him about his lipid profile prior to  that.     Bruce Elvera Lennox Juanda Chance, MD, [facility]  Electronically Signed  BRB/MedQ  DD: 08/01/2006  DT: 08/02/2006  Job #: [id number]

## 2010-10-28 NOTE — Op Note (Signed)
[place]  Patient:    Nathaniel [name], Nathaniel [name] Visit Number: [id number] MRN: [id number]          Service Type: END Location: ENDO Attending Physician:  Rich Brave Proc. Date: 03/27/01 Admit Date:  03/27/2001                             Operative Report  PROCEDURE:  Upper endoscopy with biopsies.  INDICATIONS:  A 65 year old with intensified reflux symptoms recently, responding to PPI therapy, but with a family history of adenocarcinoma of the gastroesophageal junction in his brother.  FINDINGS:  Small hiatal hernia with questionable short segment Barretts esophagus.  Small gastric polyps.  DESCRIPTION OF PROCEDURE:  The nature, purpose, and risks of the procedure were familiar to the patient for prior examination.  He provided written consent.  Sedation was Versed 8 mg IV (no fentanyl in view of a history of prior nausea to that medication).  The Olympus small caliber adult video endoscope was passed quite easily under direct vision, entering the esophagus with just mild resistance.  The esophageal mucosa was normal down to the gastroesophageal junction where there was a distinct Z-line that was somewhat irregular and raised the question of a possible small tongue of Barretts mucosa although, unbalanced, I tend to think this was not Barretts mucosa.  Biopsies were obtained from the margin just below the squamocolumnar junction to look for evidence of intestinal metaplasia.  There was a 2 cm hiatal hernia present.  The stomach contained a small clear residual which was suctioned up. There was a roughly 5 mm gastric polyp on the greater curve aspect of the proximal antrum, removed by several cold biopsies.  There were also a couple of dimminutive sessile polyps nearby, one of which was biopsied.  No other polyps were seen.  There were no large gastric masses, no evidence of gastritis, erosions, or ulcers or other significant  abnormalities apart from the polyps mentioned above.  Retroflexed viewing of the proximal stomach was unremarkable.  The pyloris, duodenal bulb, and second duodenum looked normal.  The patient tolerated the procedure well and there were no apparent complications.  IMPRESSION: 1. Small gastric polyps. 2. Questionable short segment Barretts esophagus.  PLAN:  Await pathology on todays biopsies.  Continue Protonix 40 mg daily. Attending Physician:  Rich Brave DD:  03/27/01 TD:  03/27/01 Job: [id number]

## 2010-10-28 NOTE — Cardiovascular Report (Signed)
NAMEMarland Kitchen  Nathaniel [name], Nathaniel [name] NO.:  [id number]   MEDICAL RECORD NO.:  [id number]          PATIENT TYPE:  INP   LOCATION:  3704                         FACILITY:  [facility]   PHYSICIAN:  Carole Binning, M.D. LHCDATE OF BIRTH:  03-04-1946   DATE OF PROCEDURE:  11/03/2004  DATE OF DISCHARGE:                              CARDIAC CATHETERIZATION   PROCEDURE PERFORMED:  1.  Left heart catheterization with coronary angiography and left      ventriculography.  2.  Intravascular ultrasound of the left anterior descending artery.   INDICATIONS:  The patient is a 65 year old male who has had progressive  exertional chest pain. He was evaluated by Dr. Corinda Gubler and admitted and  referred for cardiac catheterization.   PROCEDURE NOTE:  A 6-French sheath was placed in the right femoral artery.  Coronary angiography was performed a standard Judkins 6-French catheters.  Left ventriculography was performed with an angled pigtail catheter.  Contrast was Omnipaque. After we completed the diagnosis catheterization, we  opted to proceed with intravascular ultrasound in the left anterior  descending artery. We used a 6-French JL-3.5 guide. Heparin was administered  to achieve an ACT greater than 200 seconds. An Asahi soft coronary guidewire  was advanced under fluoroscopic guidance into the distal LAD. We then  performed intravascular ultrasound utilizing the Galaxy II IVUS system. The  images were recorded on automatic pullback. Following intravascular  ultrasound, the coronary guidewire was removed. Angiographic images were  again obtained revealing no evidence of vessel trauma and patency of the  LAD. At the conclusion of the procedure, a 6-French AngioSeal vascular  closure device was placed in the right femoral artery with good hemostasis.  There were no complications.   RESULTS:   HEMODYNAMIC RESULTS:  Left ventricular pressure 124/14. Aortic pressure  124/70. There is no aortic  valve gradient.   LEFT VENTRICULOGRAM:  Wall motion is normal. Ejection fraction calculated at  64%. There is 1+ mild mitral regurgitation.   CORONARY ANGIOGRAPHY:  1.  Left main is short but normal.  2.  Left anterior descending artery has minor luminal irregularities in the      proximal vessel. In the midvessel there is an area that initially      appeared to be 40-50% stenosed associated with an area of muscle      bridging. This did decrease in severity with intracoronary      nitroglycerin. LAD gives rise to single large bifurcating diagonal      branch.  3.  Left circumflex gives rise to a small first obtuse marginal, a large      second obtuse marginal, small third obtuse marginal branch. There is a      20% stenosis in the second obtuse marginal branch.  4.  Right coronary is a relatively small vessel. There is a 30% stenosis in      the mid to distal vessel at the acute margin. The right coronary is      otherwise normal giving rise to a normal to large sized acute marginal,      small posterior descending artery, and a  small posterolateral branch.   INTRAVASCULAR ULTRASOUND:  The left anterior descending artery confirmed the  presence of muscle bridging in the mid-LAD; however, there is no significant  plaque in this area where there is a questionable stenosis noted  angiographically. In the more proximal vessel, there is very mild but  nonobstructive plaque.   IMPRESSION:  1.  Normal left ventricular systolic function.  2.  Mild nonobstructive coronary disease.  3.  Intravascular ultrasound of the left anterior descending artery      revealing no significant stenosis.   PLAN:  The patient be managed medically. He will be evaluated for  alternative etiologies of his chest pain.      MWP/MEDQ  D:  11/03/2004  T:  11/03/2004  Job:  [id number]   cc:   Rosalyn Gess. Norins, M.D. [employer] [facility]   Charlies Constable, M.D. [facility]

## 2010-10-28 NOTE — Op Note (Signed)
Nathaniel [name], BARRELL NO.:  [id number]   MEDICAL RECORD NO.:  [id number]          PATIENT TYPE:  AMB   LOCATION:  ENDO                         FACILITY:  [facility]   PHYSICIAN:  [name], M.D.   DATE OF BIRTH:  12/31/1945   DATE OF PROCEDURE:  09/02/2004  DATE OF DISCHARGE:                                 OPERATIVE REPORT   PROCEDURE:  Upper endoscopy.   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   FINDINGS:  Small hiatal hernia.   PROCEDURE:  The nature, purpose, and risks of the procedure were familiar to  the patient, who provided written consent.  Sedation was Demerol 50 mcg and  Versed 7 mg IV without arrhythmias or desaturation.  The Olympus standard  adult video endoscope was passed under direct vision.  The vocal cords  looked slightly inflamed, but without any nodules or masses.  The esophagus  was entered without undue difficulty and had normal mucosa throughout its  entire length, without pre-reflux, reflux esophagitis, Barrett's esophagus,  varices, infection, or neoplasia.  There was a well-defined squamocolumnar  junction constituting a minimal Schatzki's ring, and below this was a 2 cm  hiatal hernia.  The stomach contained no significant residual, and he had  normal mucosa throughout, including a retroflexed view of the cardia.  No  gastritis, erosions, ulcers, polyps, or masses were seen.  The pylorus,  duodenal bulb, and second duodenum similarly looked normal.  No biopsies  were obtained.  The patient tolerated the procedure well, and there were no  apparent complications.   IMPRESSION:  Normal endoscopy, except for small hiatal hernia. No adverse  sequelae of reflux identified other than perhaps some minimal reflux  laryngitis (soft __________) (530.81).   PLAN:  Continue medical therapy.      RB/MEDQ  D:  09/02/2004  T:  09/03/2004  Job:  [id number]

## 2010-10-28 NOTE — Letter (Signed)
September 06, 2006    Dr. Payton Mccallum  [address]  [address]  [place], [place].  [address]   RE:  Nathaniel [name], Nathaniel [name]  MRN:  [id number]  /  DOB:  07-31-1945   Dear Dr. 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  Nathaniel Coombs has mild nonobstructive coronary artery disease, dyslipoproteinemia  with low HDL and microvascular angina.  His left ventricular function  has been good, and symptomatically, he has been stable.  He has had an  echocardiogram, which has not shown any significant pathology in his  aortic or mitral valve and has shown good left ventricular function.   With regards to his specific questions, he does not have rheumatic  valvular heart disease and does not have mitral valve prolapse or any  other valvular pathology and does not need antibiotic prophylaxis  against endocarditis for prostate biopsy or any other surgical  procedures.  He should be at low cardiac risk for surgical procedures,  general anesthesia, and conscious sedation.   I will enclose copies of his echocardiogram and his catheterization  report and his most recent office visits for your records.  Please let  me know if I can provide any further information.    Sincerely,      Bruce R. Juanda Chance, MD, [facility]  Electronically Signed    BRB/MedQ  DD: 09/06/2006  DT: 09/06/2006  Job #: [id number]   CC:    Samul Dada, M.D.

## 2010-10-28 NOTE — Assessment & Plan Note (Signed)
[facility]                              CARDIOLOGY OFFICE NOTE   NAME:Hanisch, RENNER SEBALD                    MRN:          [id number]  DATE:02/28/2006                            DOB:          [birth year]    PRIMARY CARE PHYSICIAN:  Dr. Illene Regulus.   CLINICAL HISTORY:  [name] was back for followup management of his  microvascular angina.  He was evaluated by Dr. [name] at [facility].  He had a rest/stress echo, during which he had severe pain but  no EKG change and no wall motion abnormality.  He also had an adenosine MR  scan during which he also had severe pain but no evidence of ischemia with  an enhanced MR image.  I spoke with Dr. Romeo Apple and he favored conventional  treatment with beta blocker and nitrates over calcium channel blockers and  Ranexa.   [name] says his symptoms have been about the same.  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  He will usually slow  down and symptoms will abate.   PAST MEDICAL HISTORY:  Significant for hyperlipidemia with a low HDL and he  has a low CRP.   CURRENT MEDICATIONS INCLUDE:  Norvasc, Prilosec, Vytorin, folic acid,  Allegra, and Prozac.   EXAMINATION:  VITAL SIGNS:  Blood pressure is 108/67, the pulse is 50 and  regular.  NECK:  There was no venous distention.  Carotid pulses were full without  bruits.  CHEST:  Clear.  HEART:  Cardiac rhythm was regular.  No murmurs or gallops.  ABDOMEN:  Soft, without organomegaly.  EXTREMITIES:  Peripheral pulses were full.  There is no peripheral edema.   His EKG showed sinus bradycardia and was otherwise normal.   IMPRESSION:  1. Microvascular angina.  2. Mild nonobstructive coronary disease with mild coronary bridging of the      left anterior descending.  3. Dyslipoproteinemia with low HDL.   RECOMMENDATIONS:  There are several therapeutic options that were limited by  a low pulse and low  blood pressure.  We discussed beta blockers, but his  resting pulse rate is 50 and he is quite reluctant to do that.  He does not  feel like he has got any benefit from Norvasc.  He is reluctant to try the  new drug Ranexa.  I think Imdur may be the best option; we have started him  on 30 mg a day.  He also questioned whether any treatment at all was needed.  Although he seems concerned about his symptoms, he says they do not really  interfere with his daily life and we did agree that the medicines are for  symptomatic relief only.  We will try the Imdur and we  might consider titrating up to 60 if needed.  I asked him to page me in a  couple of weeks if he has not improved and we can decide about that.  Otherwise, I will see him back in 4 months.  Bruce Elvera Lennox Juanda Chance, MD, [facility]    BRB/MedQ  DD:  02/28/2006  DT:  03/02/2006  Job #:  [id number]

## 2010-10-28 NOTE — Op Note (Signed)
NAMEMIKLOS, BIDINGER NO.:  [id number]   MEDICAL RECORD NO.:  [id number]          PATIENT TYPE:  AMB   LOCATION:  ENDO                         FACILITY:  [facility]   PHYSICIAN:  [name], M.D.   DATE OF BIRTH:  1945-10-22   DATE OF PROCEDURE:  09/02/2004  DATE OF DISCHARGE:                                 OPERATIVE REPORT   PROCEDURE:  Colonoscopy.   ENDOSCOPIST:  [name], M.D.   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   FINDINGS:  Moderate internal hemorrhoids.   PROCEDURE:  The nature, purpose and risks of the procedure were familiar to  the patient, who provided written consent.  Sedation for this procedure in  the upper endoscopy which preceded it totalled Demerol 70 mg and Versed 8 mg  IV, without arrhythmias or desaturation.  Digital exam of the prostate was  unremarkable.  The Olympus adjustable-tension pediatric video colonoscope  was advanced to the terminal ileum, which had a normal appearance, and  pullback was then performed.  The quality of prep was excellent and it was  felt that all areas were well-seen.   This was a normal examination.  No polyps, cancer, colitis, vascular  malformations or diverticulosis were noted.  There were moderate internal  hemorrhoids seen during pullout through the anal canal.   Retroflexion was not performed due to a small rectal ampulla, but antegrade  viewing disclosed no distal rectal abnormalities and re-inspection of the  rectum was unremarkable.  No biopsies were obtained.  The patient tolerated  the procedure well and there no apparent complications.   IMPRESSION:  Normal colonoscopy.  No source of heme-positivity identified  (792.1).   PLAN:  Home Hemoccults with consideration for capsule endoscopy if  persistent heme-positivity should be demonstrated.  Consider trial of low-  dose GlycoLax to regularize bowel  habit.      RB/MEDQ  D:  09/02/2004  T:  09/03/2004  Job:  [id number]   cc:   Rosalyn Gess. Norins, M.D. [facility]

## 2010-10-28 NOTE — Assessment & Plan Note (Signed)
[facility]                              CARDIOLOGY OFFICE NOTE   NAME:[name], [name]                      MRN:          [id number]  DATE:02/08/2006                            DOB:          1946/04/20    I spoke with [name] today about Nathaniel [name].  He reviewed the  angiogram, and was concerned about the bridging and the plaque in the LAD,  that this might represent a flow-reducing lesion.  For this reason, he did a  stress echo.  Nathaniel [name] had chest pain with the echo, but had a normal ECG and no  wall motion abnormality with the echocardiogram.  He achieved a heart rate  of 134.  He also did an adenosine MR with gadolinium, and this also showed  no evidence of ischemia and no evidence of previous scarring.  Based on  these findings, he thought he did indeed have microvascular angina.  He  thought that conventional therapy was the best choice, even though we are  somewhat limited by his low blood pressure and heart rate.  He suggested  beta blocker first, then adding nitrates.  If he is refractory to this, then  he would consider Ranexa.  He felt we might need to back off on the Norvasc  in order to have enough blood pressure to tolerate the beta blockers and  nitrates.  I will arrange for Nathaniel [name] to come in for a visit in the next couple  of weeks.                               Bruce Elvera Lennox Juanda Chance, MD, [facility]    BRB/MedQ  DD:  02/08/2006  DT:  02/09/2006  Job #:  [id number]

## 2010-10-28 NOTE — Cardiovascular Report (Signed)
Nathaniel [name]. [facility]  Patient:    Nathaniel [name] Visit Number: [id number] MRN: [id number]          Service Type: CAT Location: [facility] 2899 [id number] Attending Physician:  Nathaniel [name] Dictated by:   Nathaniel [name], M.D. [facility] Proc. Date: 09/19/01 Admit Date:  09/19/2001 Discharge Date: 09/19/2001   CC:         Nathaniel [name], M.D. [facility]  Nathaniel [name], M.D. [facility]  Cardiopulmonary Laboratory   Cardiac Catheterization  PROCEDURES PERFORMED: Cardiac catheterization.  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 He recently developed some symptoms of fatigue and because of this and his positive family history, Dr. Debby [name] ordered a stress Cardiolite scan. During exercise, he had some mild chest discomfort and he had about 1.5 mm ST depression during exercise with upsloping ST segments, which resolved immediately post exercise. His scan showed a questionable apical hypodensity in one of three views. We felt these findings were all borderline, but Dr. Graciela [name] saw them in the office and felt that it was best to evaluate him further with angiography, and I agreed, and we scheduled that for today.  DESCRIPTION OF PROCEDURE: The procedure was performed via the right femoral artery using an arterial sheath and 6 French preformed coronary catheters.  A front wall arterial puncture was performed and Omnipaque contrast was used. A distal aortogram was performed to rule out abdominal aortic aneurysm. The right femoral artery was closed with Perclose at the end of the procedure.  He tolerated the procedure well and left the laboratory in satisfactory condition.  RESULTS: The left main coronary artery: The left main coronary artery was free of significant disease.  Left anterior descending: The left anterior descending artery gave rise to a large  diagonal branch and three septal perforators. These and the LAD proper were free of significant disease.  Circumflex artery: The circumflex artery gave rise to an intermediate branch, a small marginal branch and two large posterolateral branches. These vessels were free of significant disease.  Right coronary artery: The right coronary is a moderate sized vessel that gave rise to a conus branch, right ventricular branch, a marginal branch that supplied part of the inferior septum and two posterolateral branches. These vessels were free of significant disease.  LEFT VENTRICULOGRAPHY: The left ventriculogram performed in the RAO projection showed good wall motion with no areas of hypokinesis. The estimated ejection fraction was 60%.  DISTAL AORTOGRAM: A distal aortogram was performed, which showed no renal artery stenosis and no significant aortoiliac obstruction.  The aortic pressure was 130/77 with a mean of 99. Left ventricular pressure was 130/24.  CONCLUSIONS: Normal coronary angiography and left ventricular wall motion.  RECOMMENDATIONS: Reassurance. In view of these findings, I do not think the scan and exercise tests has any significance in terms of ischemic heart disease. The patient does have a history of an abnormal mitral valve, which was diagnosed as mitral valve prolapse on echocardiogram two years ago. We do not see much prolapse and no regurgitation on his left ventriculogram today. Dictated by:   Nathaniel [name], M.D. [facility] Attending Physician:  Nathaniel [name] DD:  09/19/01 TD:  09/20/01 Job: [id number]

## 2010-10-28 NOTE — Op Note (Signed)
NAME:  Nathaniel [name], Nathaniel [name] NO.:  [id number]   MEDICAL RECORD NO.:  [id number]          PATIENT TYPE:  AMB   LOCATION:  ENDO                         FACILITY:  [facility]   PHYSICIAN:  [name], M.D.   DATE OF BIRTH:  1946/04/22   DATE OF PROCEDURE:  01/03/2006  DATE OF DISCHARGE:                                 OPERATIVE REPORT   PROCEDURE:  Upper endoscopy with biopsy.   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  [name] has had a history of intermittent dyspeptic, reflux-  type symptoms which have intensified recently and been initially refractory  to high-dose PPI therapy, although lately the symptoms seem to be coming  under good control.   FINDINGS:  A 2-cm hiatal hernia with minimal esophageal ring.  Erythematous  bumps on gastric lining, probably due to medication-related irritation.   PROCEDURE:  The nature, purpose, and risks of the procedure were familiar to  the patient from prior examination.  He provided written consent.  Sedation  was Demerol 60 mg (the patient develops nausea and vomiting with fentanyl)  and Versed 6 mg IV without arrhythmias, desaturation or clinical  instability.  The Olympus small-caliber adult video endoscope was passed  under direct vision.  There were no frank laryngeal abnormalities during the  brief glimpse that we had.  The esophagus was readily entered.  The  esophageal mucosa was completely normal, with no evidence of reflux  esophagitis, Barrett's esophagus, varices, infection or neoplasia.  There  was a widely patent minimal esophageal ring (Schatzki's ring) at the  squamocolumnar junction and below this was a 1-2 cm hiatal hernia with  gastric folds extending all the way to the squamocolumnar junction, thereby  excluding circumferential Barrett's esophagus.   The stomach contained no significant residual.  There were some faintly  pink, slightly raised bumps in the  antral region, perhaps related to mild  inflammation from medications.  However, no mucosal hemorrhages, erosions,  ulcerations, polyps, nodules or masses were seen.  A retroflexed view of the  cardia was unremarkable, showing a small hiatal hernia from its inferior  perspective, and the pylorus, duodenal bulb and second duodenum were  unremarkable.   Biopsies were obtained of the antral limb bumps prior to removal of the  scope.  The patient tolerated the procedure well, and there were no apparent  complications.   IMPRESSION:  1.  Intensified reflux symptoms, without worrisome findings on current      examination.  2.  Small hiatal hernia with esophageal ring.  3.  Minimally erythematous raised areas in the antrum of the stomach of      doubtful clinical significance, path pending.   PLAN:  Continue current medical therapy while awaiting biopsy results.           ______________________________  [name], M.D.     RB/MEDQ  D:  01/03/2006  T:  01/03/2006  Job:  [id number]

## 2010-10-28 NOTE — Op Note (Signed)
[place]  Patient:    HUTCH, RHETT Visit Number: [id number] MRN: [id number]          Service Type: END Location: ENDO Attending Physician:  Rich Brave Dictated by:   Florencia Reasons, M.D. Proc. Date: 08/21/01 Admit Date:  08/21/2001   CC:         Rosalyn Gess. Norins, M.D. [facility]   Operative Report  PROCEDURE PERFORMED:  Colonoscopy with biopsy.  ENDOSCOPIST:  Florencia Reasons, M.D.  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  Last colonoscopy approximately 10 years ago was negative. At that time, the patient had had hemoccult positive stool.  FINDINGS:  Tiny sessile polyp at 22 cm removed by cold biopsy.  Otherwise normal exam to the terminal ileum.  DESCRIPTION OF PROCEDURE:  The nature, purpose and risks of the procedure had been discussed with the patient, who provided written consent.  Sedation was Versed 8 mg IV (no fentanyl, in view of a prior history of nausea and vomiting apparently associated with that medication).  Digital exam of the prostate was normal.  The Olympus adjustable tension pediatric video colonoscope was advanced to the terminal ileum without significant difficult and pull-back was then performed.  The terminal ileum looked normal.  The quality of the prep in the colon was  excellent and it is felt that all areas were well seen.  There was a tiny 2 mm hyperplastic appearing polyp at about 22 cm from the external anal opening removed by a couple of cold biopsies.  No other polyps were seen and there was no evidence of cancer, colitis, vascular malformations or diverticulosis.  Retroflexion in the rectum was normal.  The patient tolerated the procedure very well and there were no apparent complications.  IMPRESSION:  Solitary diminutive distal colonic polyp.  PLAN:  Await pathology.  Consider follow-up screening colonoscopy in five to 10  years, depending in part on histologic findings of the polyp. Dictated by:   Florencia Reasons, M.D. Attending Physician:  Rich Brave DD:  08/21/01 TD:  08/21/01 Job: [id number]

## 2010-10-28 NOTE — Discharge Summary (Signed)
NAMEREGINALDO, HAZARD NO.:  [id number]   MEDICAL RECORD NO.:  [id number]          PATIENT TYPE:  INP   LOCATION:  3704                         FACILITY:  [facility]   PHYSICIAN:  Carole Binning, M.D. LHCDATE OF BIRTH:  1945-07-13   DATE OF ADMISSION:  11/02/2004  DATE OF DISCHARGE:  11/04/2004                                 DISCHARGE SUMMARY   PROCEDURES:  1.  Cardiac catheterization.  2.  Coronary arteriogram.  3.  Left ventriculogram.   DISCHARGE DIAGNOSES:  1.  Chest pain status post cardiac catheterization with left anterior      descending muscle bridge and no significant plaque at the area of      questionable stenosis.  Mild nonobstructive plaque in the proximal left      anterior descending, possible microvascular disease/spasm.  2.  Gastroesophageal reflux disease.  3.  Allergic rhinitis.  4.  Hyperlipidemia.  5.  Family history of mitral valve replacement and then sudden death in      father at age 96.   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  He was  seen in the office by Dr. Juanda Chance on Oct 26, 2004 and scheduled for a stress  test.  The stress test was equivocal and Dr. Glennon Hamilton felt that cardiac  catheterization was indicated.  He was admitted for this on Nov 02, 2004.   [name] had a cardiac catheterization which showed nonobstructive  disease.  Dr. Gerri Spore was concerned about possible spasm as well as  microvascular disease.  There was also a muscle bridge in the LAD.  Because  of this [name] was started on Norvasc which he tolerated well.   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  His vital signs were stable with his systolic blood pressure  between 100-115.  His chest x-ray showed central bronchitic changes that  were likely chronic with no acute cardiopulmonary disease.  Dr.  Arline Asp was  evaluated and considered stable for discharge on Nov 04, 2004.   DISCHARGE INSTRUCTIONS:  His activity level is to include no strenuous  activity for two days.  He is to call the office for problems with  catheterization site.  He is to stick to a low fat diet.  He is to follow up  with Dr. Juanda Chance and get an echocardiogram before that office visit.   DISCHARGE MEDICATIONS:  1.  Norvasc 2.5 mg daily.  2.  Nitroglycerin sublingual p.r.n.  3.  Coated aspirin 325 mg daily.  4.  Axid is on hold.  5.  Zocor 20 mg daily.  6.  Prozac 10 mg daily.  7.  Allegra 180 mg daily.  8.  Prilosec b.i.d.      RB/MEDQ  D:  11/04/2004  T:  11/04/2004  Job:  [id number]   cc:   Charlies Constable, M.D. [facility]   Rosalyn Gess. Norins, M.D. [facility]

## 2011-01-09 ENCOUNTER — Encounter: Payer: Self-pay | Admitting: Cardiovascular Disease

## 2011-02-09 ENCOUNTER — Ambulatory Visit (INDEPENDENT_AMBULATORY_CARE_PROVIDER_SITE_OTHER): Payer: 59 | Admitting: Cardiovascular Disease

## 2011-02-09 ENCOUNTER — Encounter: Payer: Self-pay | Admitting: Cardiovascular Disease

## 2011-02-09 DIAGNOSIS — E785 Hyperlipidemia, unspecified: Secondary | ICD-10-CM

## 2011-02-09 DIAGNOSIS — E7800 Pure hypercholesterolemia, unspecified: Secondary | ICD-10-CM

## 2011-02-09 DIAGNOSIS — I2510 Atherosclerotic heart disease of native coronary artery without angina pectoris: Secondary | ICD-10-CM

## 2011-02-09 DIAGNOSIS — I209 Angina pectoris, unspecified: Secondary | ICD-10-CM

## 2011-02-09 NOTE — Patient Instructions (Signed)
Your physician wants you to follow-up in: 12 months.  You will receive a reminder letter in the mail two months in advance. If you don't receive a letter, please call our office to schedule the follow-up appointment.  Your physician recommends that you return for lab work in: January 2013--Lipid, Liver.  Your physician has requested that you have a stress echocardiogram. For further information please visit [email or web address] Please follow instruction sheet as given. To be done in 12 months prior to office visit

## 2011-02-10 ENCOUNTER — Encounter: Payer: Self-pay | Admitting: Cardiovascular Disease

## 2011-02-10 NOTE — Assessment & Plan Note (Signed)
Lipids are excellent with a total cholesterol of 135, HDL greater than 40, and LDL less than 80. Continue current therapy.

## 2011-02-10 NOTE — Assessment & Plan Note (Signed)
The patient is stable, class II angina. He has mild nonobstructive coronary artery disease with microvascular angina. His exertional chest pain is very typical of angina. We discussed antianginal therapy and without any change in symptoms he does not wish to pursue this. He will continue on risk reduction program which essentially consists of lipid-lowering therapy. He has not been on antiplatelet therapy because of severe reflux and concerns are the use of aspirin. He will followup in one year with a stress echocardiogram and an office visit to follow.

## 2011-02-10 NOTE — Progress Notes (Signed)
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  He is a 65 year old gentleman with nonobstructive coronary artery disease and microvascular angina. He has undergone multiple cardiac catheterization procedures and stress imaging procedures in the past. His most recent cardiac catheterization was reviewed and this was in 2008. At that time he was noted to have a 30% proximal LAD stenosis and nonobstructive stenosis of a diagonal branch vessel.  Others and lipid-lowering therapy, the patient has not been on aggressive medical therapy for coronary artery disease as his epicardial disease is fairly mild in nature. His symptoms have not been particularly limiting. He complains of very predictable exertional angina that occurs in a moderate workload such as walking briskly up a flight of stairs or walking on a graded treadmill. He reports no change in his anginal threshold over the last several years. He does admit to exertional dyspnea which occurs earlier than it had in the past. He denies orthopnea, PND, palpitations, lightheadedness, or edema.  Outpatient Encounter Prescriptions as of 02/09/2011  Medication Sig Dispense Refill  . cholecalciferol (VITAMIN D) 1000 UNITS tablet Take 1,000 Units by mouth daily.        Marland Kitchen ezetimibe-simvastatin (VYTORIN) 10-40 MG per tablet Take 1 tablet by mouth at bedtime.        . fexofenadine (ALLEGRA) 180 MG tablet Take 180 mg by mouth daily.        . fish oil-omega-3 fatty acids 1000 MG capsule Take 1 g by mouth daily.        Marland Kitchen omeprazole-sodium bicarbonate (ZEGERID) 40-1100 MG per capsule Take 1 capsule by mouth 2 (two) times daily.        . Tamsulosin HCl (FLOMAX) 0.4 MG CAPS Take 0.4 mg by mouth daily.        Marland Kitchen DISCONTD: FLUoxetine (PROZAC) 10 MG tablet Take 10 mg by mouth daily.          Allergies  Allergen Reactions  . Penicillins     Past Medical History  Diagnosis Date  . GERD (gastroesophageal reflux disease)   . Pruritus   . History of rheumatic fever    . Increased prostate specific antigen (PSA) velocity   . Anemia   . Allergic rhinitis   . Palpitations   . Mitral valve prolapse   . Hiatal hernia     small  . Hyperlipidemia   . Coronary artery disease     nonobstructive    ROS: Negative except as per HPI  BP 128/66  Pulse 53  Ht 5\' 8"  (1.727 m)  Wt 177 lb (80.287 kg)  BMI 26.91 kg/m2  PHYSICAL EXAM: Pt is alert and oriented, NAD HEENT: normal Neck: JVP - normal, carotids 2+= without bruits Lungs: CTA bilaterally CV: RRR without murmur or gallop Abd: soft, NT, Positive BS, no hepatomegaly Ext: no C/C/E, distal pulses intact and equal Skin: warm/dry no rash  EKG:  Sinus bradycardia 53 beats per minute otherwise within normal limits.  ASSESSMENT AND PLAN:

## 2011-03-13 DIAGNOSIS — I2699 Other pulmonary embolism without acute cor pulmonale: Secondary | ICD-10-CM

## 2011-03-13 DIAGNOSIS — R972 Elevated prostate specific antigen [PSA]: Secondary | ICD-10-CM | POA: Insufficient documentation

## 2011-03-13 DIAGNOSIS — I82409 Acute embolism and thrombosis of unspecified deep veins of unspecified lower extremity: Secondary | ICD-10-CM

## 2011-03-13 HISTORY — DX: Acute embolism and thrombosis of unspecified deep veins of unspecified lower extremity: I82.409

## 2011-03-13 HISTORY — DX: Other pulmonary embolism without acute cor pulmonale: I26.99

## 2011-04-02 ENCOUNTER — Emergency Department (HOSPITAL_COMMUNITY): Payer: 59

## 2011-04-02 ENCOUNTER — Ambulatory Visit (HOSPITAL_COMMUNITY)
Admission: RE | Admit: 2011-04-02 | Discharge: 2011-04-02 | Disposition: A | Discharge status: Home / Self Care | Payer: 59 | Source: Ambulatory Visit

## 2011-04-02 ENCOUNTER — Emergency Department (HOSPITAL_COMMUNITY)
Admission: EM | Admit: 2011-04-02 | Discharge: 2011-04-02 | Disposition: A | Discharge status: Home / Self Care | Payer: 59 | Attending: Emergency Medicine | Admitting: Emergency Medicine

## 2011-04-02 DIAGNOSIS — Z79899 Other long term (current) drug therapy: Secondary | ICD-10-CM | POA: Insufficient documentation

## 2011-04-02 DIAGNOSIS — I824Z9 Acute embolism and thrombosis of unspecified deep veins of unspecified distal lower extremity: Secondary | ICD-10-CM | POA: Insufficient documentation

## 2011-04-02 DIAGNOSIS — N400 Enlarged prostate without lower urinary tract symptoms: Secondary | ICD-10-CM | POA: Insufficient documentation

## 2011-04-02 DIAGNOSIS — I2510 Atherosclerotic heart disease of native coronary artery without angina pectoris: Secondary | ICD-10-CM | POA: Insufficient documentation

## 2011-04-02 DIAGNOSIS — D1803 Hemangioma of intra-abdominal structures: Secondary | ICD-10-CM | POA: Insufficient documentation

## 2011-04-02 DIAGNOSIS — M79609 Pain in unspecified limb: Secondary | ICD-10-CM

## 2011-04-02 DIAGNOSIS — E785 Hyperlipidemia, unspecified: Secondary | ICD-10-CM | POA: Insufficient documentation

## 2011-04-02 DIAGNOSIS — R918 Other nonspecific abnormal finding of lung field: Secondary | ICD-10-CM | POA: Insufficient documentation

## 2011-04-02 DIAGNOSIS — I2699 Other pulmonary embolism without acute cor pulmonale: Secondary | ICD-10-CM | POA: Insufficient documentation

## 2011-04-02 DIAGNOSIS — K219 Gastro-esophageal reflux disease without esophagitis: Secondary | ICD-10-CM | POA: Insufficient documentation

## 2011-04-02 LAB — CK TOTAL AND CKMB (NOT AT ARMC)
CK, MB: 3.8 ng/mL (ref 0.3–4.0)
Relative Index: 2.2 (ref 0.0–2.5)
Total CK: 170 U/L (ref 7–232)

## 2011-04-02 LAB — BASIC METABOLIC PANEL
CO2: 26 mEq/L (ref 19–32)
Calcium: 9.6 mg/dL (ref 8.4–10.5)
Chloride: 106 mEq/L (ref 96–112)
Potassium: 3.7 mEq/L (ref 3.5–5.1)
Sodium: 140 mEq/L (ref 135–145)

## 2011-04-02 LAB — CBC
MCH: 28.9 pg (ref 26.0–34.0)
Platelets: 218 10*3/uL (ref 150–400)
RBC: 4.74 MIL/uL (ref 4.22–5.81)
WBC: 6.3 10*3/uL (ref 4.0–10.5)

## 2011-04-02 LAB — APTT: aPTT: 30 seconds (ref 24–37)

## 2011-04-02 LAB — PROTIME-INR: Prothrombin Time: 13.9 seconds (ref 11.6–15.2)

## 2011-04-02 MED ORDER — IOHEXOL 300 MG/ML  SOLN
100.0000 mL | Freq: Once | INTRAMUSCULAR | Status: AC | PRN
  Administered 2011-04-02: 100 mL via INTRAVENOUS

## 2011-04-03 ENCOUNTER — Telehealth: Payer: Self-pay | Admitting: Cardiovascular Disease

## 2011-04-03 DIAGNOSIS — R079 Chest pain, unspecified: Secondary | ICD-10-CM

## 2011-04-03 LAB — BETA-2-GLYCOPROTEIN I ABS, IGG/M/A
Beta-2 Glyco I IgG: 0 G Units (ref <20)
Beta-2-Glycoprotein I IgM: 1 M Units (ref <20)

## 2011-04-03 LAB — LUPUS ANTICOAGULANT PANEL
DRVVT: 40 secs (ref 34.1–42.2)
Lupus Anticoagulant: NOT DETECTED
PTT Lupus Anticoagulant: 35.7 secs (ref 28.0–43.0)

## 2011-04-03 LAB — ANTITHROMBIN III: AntiThromb III Func: 104 % (ref 76–126)

## 2011-04-03 LAB — CARDIOLIPIN ANTIBODIES, IGG, IGM, IGA: Anticardiolipin IgM: 1 MPL U/mL — ABNORMAL LOW (ref <11)

## 2011-04-03 LAB — HOMOCYSTEINE: Homocysteine: 13.6 umol/L (ref 4.0–15.4)

## 2011-04-03 NOTE — Telephone Encounter (Signed)
I spoke with the pt and he was discharged from the ER yesterday on Lovenox and Coumadin.  The pt was diagnosed with a DVT and PE. The pt would like to have his coumadin followed in our office.  I have scheduled the pt to come into the coumadin clinic on 04/05/11 at 8:00.  The pt also would like Dr Excell Seltzer to review his CTA report.  The pt is concerned about plaque in left main.  The pt is also aware of lung nodules.  The pt would like to come into the office in December to see Dr Excell Seltzer and discuss his CTA.  I scheduled him for an appointment on 05/23/11 at 4:30. The pt would like Dr Excell Seltzer to review his cardiac cath and CTA in regards to left main.  (Emergency Contact info: Pt's spouse Arline Asp [phone]).  I will forward this message to Dr Excell Seltzer.

## 2011-04-03 NOTE — Telephone Encounter (Signed)
Pt was in er last night he was concerned about taking coumadin and what he needs to do next please call

## 2011-04-04 LAB — PROTEIN S, TOTAL: Protein S Ag, Total: 230 % — ABNORMAL HIGH (ref 60–150)

## 2011-04-04 NOTE — Progress Notes (Signed)
NAME:  Nathaniel [name], Nathaniel [name] NO.:  [id number]  MEDICAL RECORD NO.:  [id number]  LOCATION:  WLED                         FACILITY:  [facility]  PHYSICIAN:  Hollice Espy, M.D.DATE OF BIRTH:  07-Aug-1945                                PROGRESS NOTE   PRIMARY CARE PHYSICIAN: Rosalyn Gess. Norins, MD  CARDIOLOGIST: Veverly Fells. Excell Seltzer, MD  CHIEF COMPLAINT: Calf and chest pain.  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  He left approximately 10 days ago and returned back this past Thursday on overnight red eye flight, which he slept most of the way without moving very much.  He since that time has been back and was in computer training when he noted some persistent left calf pain and also a vague right-sided chest ache, which is described as naggy and not very painful, at times up in the middle of his left breast and then down to left axilla and then at times in his left shoulder.  The patient was concerned about the possibility of a DVT, given his recent travel and arranged for a lower extremity ultrasound.  The lower extremity ultrasound was positive for a left lower DVT.  He was then sent over to the emergency room for further evaluation.  In the emergency room, the patient underwent a CT scan of his chest after discussion with the ER attending because of complaints of chest pain to rule out pulmonary embolus.  The patient's chest CT was noted to have a suboptimally timed contrast dye, but highly suspicious for small volume left lower lobe pulmonary emboli.  In addition, there are no signs of right heart strain or other complication.  He was found to have some coronary artery atherosclerosis including a calcified plaque at the distal left main coronary artery.  He has an ascending aortic size in the  upper limits of normal and some benign hemangioma, liver lesions.  Tiny lung nodules were noted as well, but the patient is at low risk for bronchogenic carcinoma.  MEDICATIONS: The patient is on: 1. Zegerid 40 mg p.o. b.i.d. for GERD. 2. Allegra 180 mg daily. 3. Vitamin D plus omega-3. 4. Vytorin 10/40 mg. 5. Flomax 0.4 mg p.o. daily.  ALLERGIES: PENICILLIN.  PAST MEDICAL HISTORY: 1. GERD. 2. Seasonal rhinitis. 3. Microvessel angina. 4. BPH. 5. He has a history of what they think may have been a fat embolus     several years ago and not a confirmed PE.  SOCIAL HISTORY: The patient does not heavily drink, smoke, or take any illegal drugs.  PHYSICAL EXAMINATION: VITAL SIGNS:  On admission, temperature 98.5, heart rate 72, blood pressure 134/71, respirations 18, and O2 saturation 96% on room air. GENERAL:  He is alert and oriented x3, currently in no acute distress. HEENT:  Normocephalic and atraumatic.  His mucous membranes are moist. NECK:  He has no carotid bruits. HEART:  Regular rate and rhythm, S1 and S2. LUNGS:  Clear to auscultation bilaterally.  He has no pleuritic pain. ABDOMEN:  Soft,  nontender, and nondistended.  Positive bowel sounds. EXTREMITIES:  Show no clubbing or cyanosis.  He has a trace bilateral pitting edema and his left calf looks slightly larger than his right.  REVIEW OF SYSTEMS: He otherwise is doing okay.  Currently, he is not having any difficulty breathing.  No chest pain and as long as he keeps his legs elevated while sitting in the stretcher, he is having no leg pain.  Earlier he is having significant pain when he stands up.  Review of systems is otherwise negative.  LABORATORY WORK: CT scan of the chest is as per HPI.  Sodium 140, potassium 3.7, chloride 106, bicarbonate 26, BUN 15, creatinine 1.13, glucose 108, INR is 1.05, PTT is 30, CPK is 170, MB is 3.8, and troponin I less than 0.3. Hypercoagulable panel has been ordered and is  pending.  ASSESSMENT AND PLAN: 1. Deep venous thrombosis with small pulmonary embolus.  I had an     extensive discussion with the patient as well as with pharmacy.     Given the patient's medical background, his resources and most     importantly his stability, I am comfortable with anticoagulating     the patient, starting on q.12 h. Lovenox starting tonight, then can     send him home with a 2nd dose of Lovenox which he can self     administer either at work or at home tomorrow morning.  Following     this, he then can arrange to be follow up at [facility] as per     his cardiologist, Dr. Excell Seltzer and continue to receive doses of     Lovenox for him to go home until his INR is therapeutic.  The cause     again is almost certainly his plane trip, although again the     patient would like a hypercoagulable panel done and given his     background and training, I would defer to him on this matter. 2. Coronary artery disease.  The patient has history of microvessel     angina and then in addition, he is noted to have a calcified plaque     in the left main artery.  I discussed these findings with the     patient.  We will also send a copy of this summary to Dr. Tonny Bollman, his cardiologist.  Dr. Excell Seltzer has been ordered the patient     to get a cardiac stress test unlikely he will go ahead and get one. 3. Benign prostatic hyperplasia.  The patient will continue on his     home dose of Flomax. 4. Micronodule seen in the lungs on CT, again low risk for     bronchogenic carcinoma.  So this does not require necessary     followup. 5. Incidentally known liver hemangiomas.  The patient was said to be     medically stable.  He is being discharged to home.  He will follow     up with Dr. Excell Seltzer, calling him to set up an appointment.  In     regards to his work, I have advised the patient that I am going to     send him home with medication for Lovenox 80 mg subcutaneously q.12     h.   Coumadin starting at 5 mg tonight.  Initially pharmacy     recommended 7.5, but deferred at the patient's request for a lower     dose  and lastly, we will send the patient's prescription for     narcotics, although the patient says he hopes he will not have to     take any narcotic medications.  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  He will try to do so.  The patient     says however, he has been gone for a week and is quite behind on     his patients.     Hollice Espy, M.D.     SKK/MEDQ  D:  04/02/2011  T:  04/02/2011  Job:  [id number]  cc:   Rosalyn Gess. Norins, MD [address] [place] [address]  Veverly Fells. Excell Seltzer, MD [address] [place], [place] [address]  Electronically Signed by Virginia Rochester M.D. on 04/04/2011 10:22:01 AM

## 2011-04-05 ENCOUNTER — Ambulatory Visit (INDEPENDENT_AMBULATORY_CARE_PROVIDER_SITE_OTHER): Payer: 59 | Admitting: *Deleted

## 2011-04-05 DIAGNOSIS — I2699 Other pulmonary embolism without acute cor pulmonale: Secondary | ICD-10-CM | POA: Insufficient documentation

## 2011-04-05 DIAGNOSIS — Z7901 Long term (current) use of anticoagulants: Secondary | ICD-10-CM | POA: Insufficient documentation

## 2011-04-05 LAB — FACTOR 5 LEIDEN

## 2011-04-05 LAB — POCT INR: INR: 1.1

## 2011-04-06 LAB — PROTHROMBIN GENE MUTATION

## 2011-04-06 NOTE — Telephone Encounter (Signed)
The patient's CT images and he does have calcification of the distal left mainstem. I spoke with him on the telephone. Clinically he is doing well and is having very little angina the present time. I reviewed his last cardiac catheter images from 2008. I'm going to ask Dr. Shirlee Latch to review his CT scan. The likely set him up for a stress echocardiogram prior to his office visit in December.

## 2011-04-09 NOTE — Telephone Encounter (Signed)
Please schedule for stress echo prior to office visit in December, thx.

## 2011-04-10 ENCOUNTER — Ambulatory Visit: Payer: Self-pay | Admitting: Cardiology

## 2011-04-10 DIAGNOSIS — Z7901 Long term (current) use of anticoagulants: Secondary | ICD-10-CM

## 2011-04-10 DIAGNOSIS — I2699 Other pulmonary embolism without acute cor pulmonale: Secondary | ICD-10-CM

## 2011-04-24 NOTE — Telephone Encounter (Signed)
Follow up from previous call:   Refill coumadin refill 5 mg a day. 7.5 mg twice a week.  Please send to [facility]

## 2011-04-24 NOTE — Telephone Encounter (Signed)
Rx sent in for pt.  He gets INRs checked at [facility]

## 2011-04-28 NOTE — Telephone Encounter (Signed)
Order placed for stress echo.   

## 2011-05-02 NOTE — Telephone Encounter (Signed)
Nathaniel [name] scheduled this pt for a stress echo on 05/10/11.  I spoke with the pt about this appointment and he would like to reschedule his stress echo and office visit to January 2013.  The pt would like Nathaniel [name] to call him tomorrow afternoon.  I made Nathaniel [name] aware of this information.

## 2011-05-10 ENCOUNTER — Other Ambulatory Visit (HOSPITAL_COMMUNITY): Payer: 59 | Admitting: Radiology

## 2011-05-12 ENCOUNTER — Other Ambulatory Visit (HOSPITAL_COMMUNITY): Payer: 59 | Admitting: Radiology

## 2011-05-23 ENCOUNTER — Ambulatory Visit: Payer: 59 | Admitting: Cardiovascular Disease

## 2011-06-15 ENCOUNTER — Other Ambulatory Visit: Payer: 59

## 2011-06-26 ENCOUNTER — Telehealth: Payer: Self-pay | Admitting: Cardiovascular Disease

## 2011-06-26 DIAGNOSIS — N400 Enlarged prostate without lower urinary tract symptoms: Secondary | ICD-10-CM

## 2011-06-26 DIAGNOSIS — I82409 Acute embolism and thrombosis of unspecified deep veins of unspecified lower extremity: Secondary | ICD-10-CM

## 2011-06-26 DIAGNOSIS — I2699 Other pulmonary embolism without acute cor pulmonale: Secondary | ICD-10-CM

## 2011-06-26 DIAGNOSIS — E7800 Pure hypercholesterolemia, unspecified: Secondary | ICD-10-CM

## 2011-06-26 NOTE — Telephone Encounter (Signed)
I spoke with the pt and he wanted to reschedule his lab work and get all his labs drawn at once.  The pt also asked about instructions prior to stress echo.

## 2011-06-26 NOTE — Telephone Encounter (Signed)
New msg Patient was calling about test results please call him back

## 2011-06-28 ENCOUNTER — Ambulatory Visit (HOSPITAL_BASED_OUTPATIENT_CLINIC_OR_DEPARTMENT_OTHER): Payer: 59 | Admitting: Radiology

## 2011-06-28 ENCOUNTER — Ambulatory Visit (HOSPITAL_COMMUNITY): Payer: 59 | Attending: Cardiovascular Disease | Admitting: Radiology

## 2011-06-28 DIAGNOSIS — I209 Angina pectoris, unspecified: Secondary | ICD-10-CM

## 2011-06-28 DIAGNOSIS — R0989 Other specified symptoms and signs involving the circulatory and respiratory systems: Secondary | ICD-10-CM

## 2011-06-28 DIAGNOSIS — R079 Chest pain, unspecified: Secondary | ICD-10-CM | POA: Insufficient documentation

## 2011-06-28 DIAGNOSIS — R072 Precordial pain: Secondary | ICD-10-CM

## 2011-07-07 ENCOUNTER — Other Ambulatory Visit (INDEPENDENT_AMBULATORY_CARE_PROVIDER_SITE_OTHER): Payer: 59

## 2011-07-07 DIAGNOSIS — I2699 Other pulmonary embolism without acute cor pulmonale: Secondary | ICD-10-CM

## 2011-07-07 DIAGNOSIS — I82409 Acute embolism and thrombosis of unspecified deep veins of unspecified lower extremity: Secondary | ICD-10-CM

## 2011-07-07 DIAGNOSIS — E7800 Pure hypercholesterolemia, unspecified: Secondary | ICD-10-CM

## 2011-07-07 DIAGNOSIS — N400 Enlarged prostate without lower urinary tract symptoms: Secondary | ICD-10-CM

## 2011-07-07 LAB — CBC WITH DIFFERENTIAL/PLATELET
Basophils Absolute: 0 10*3/uL (ref 0.0–0.1)
Basophils Relative: 0.7 % (ref 0.0–3.0)
Eosinophils Relative: 5.2 % — ABNORMAL HIGH (ref 0.0–5.0)
HCT: 40.8 % (ref 39.0–52.0)
Hemoglobin: 13.8 g/dL (ref 13.0–17.0)
Lymphocytes Relative: 18.9 % (ref 12.0–46.0)
Lymphs Abs: 0.9 10*3/uL (ref 0.7–4.0)
Monocytes Relative: 10 % (ref 3.0–12.0)
Neutro Abs: 3.2 10*3/uL (ref 1.4–7.7)
RBC: 4.65 Mil/uL (ref 4.22–5.81)
RDW: 14.1 % (ref 11.5–14.6)
WBC: 4.9 10*3/uL (ref 4.5–10.5)

## 2011-07-07 LAB — BASIC METABOLIC PANEL
Calcium: 8.9 mg/dL (ref 8.4–10.5)
GFR: 61.34 mL/min (ref >60.00)
Glucose, Bld: 104 mg/dL — ABNORMAL HIGH (ref 70–99)
Potassium: 4 mEq/L (ref 3.5–5.1)
Sodium: 138 mEq/L (ref 135–145)

## 2011-07-07 LAB — HEPATIC FUNCTION PANEL
ALT: 23 U/L (ref 0–53)
AST: 21 U/L (ref 0–37)
Total Bilirubin: 0.7 mg/dL (ref 0.3–1.2)
Total Protein: 6.3 g/dL (ref 6.0–8.3)

## 2011-07-07 LAB — LIPID PANEL
Cholesterol: 108 mg/dL (ref 0–200)
HDL: 33.9 mg/dL — ABNORMAL LOW (ref >39.00)
VLDL: 27.8 mg/dL (ref 0.0–40.0)

## 2011-07-08 LAB — PSA, TOTAL AND FREE
PSA, Free Pct: 21 % — ABNORMAL LOW (ref >25)
PSA, Free: 0.84 ng/mL

## 2011-07-10 ENCOUNTER — Other Ambulatory Visit: Payer: 59 | Admitting: *Deleted

## 2011-07-12 ENCOUNTER — Ambulatory Visit (INDEPENDENT_AMBULATORY_CARE_PROVIDER_SITE_OTHER): Payer: 59 | Admitting: Cardiovascular Disease

## 2011-07-12 ENCOUNTER — Other Ambulatory Visit: Payer: Self-pay | Admitting: Dermatology

## 2011-07-12 ENCOUNTER — Encounter: Payer: Self-pay | Admitting: Cardiovascular Disease

## 2011-07-12 VITALS — BP 110/66 | HR 60 | Ht 68.0 in | Wt 180.8 lb

## 2011-07-12 DIAGNOSIS — I2699 Other pulmonary embolism without acute cor pulmonale: Secondary | ICD-10-CM

## 2011-07-12 DIAGNOSIS — I2510 Atherosclerotic heart disease of native coronary artery without angina pectoris: Secondary | ICD-10-CM

## 2011-07-12 DIAGNOSIS — I82409 Acute embolism and thrombosis of unspecified deep veins of unspecified lower extremity: Secondary | ICD-10-CM

## 2011-07-12 DIAGNOSIS — E785 Hyperlipidemia, unspecified: Secondary | ICD-10-CM

## 2011-07-12 NOTE — Assessment & Plan Note (Signed)
The patient's LDL is at goal on Vytorin. His HDL is low. We'll continue his current program.

## 2011-07-12 NOTE — Progress Notes (Signed)
HPI:  Shea Evans returns for followup evaluation. He is 66 years old and is followed for nonobstructive coronary disease with microvascular angina. From a cardiac standpoint, he has been stable over time. He has predictable chest tightness with exertion but can occur with climbing stairs or other moderate level activities. This is unchanged.  The patient was diagnosed with a left lower extremity DVT in October 2012. This occurred after a long flight. He had some vague symptoms of chest pain and shortness of breath and was ultimately diagnosed with a pulmonary embolism as well. He has been on warfarin for the past 3 months and has done well with this. He underwent a hypercoagulable workup and review of the full hypercoagulable panel today showed no significant abnormalities. He denies any left leg swelling.  Outpatient Encounter Prescriptions as of 07/12/2011  Medication Sig Dispense Refill  . cholecalciferol (VITAMIN D) 1000 UNITS tablet Take 1,000 Units by mouth daily.        Marland Kitchen ezetimibe-simvastatin (VYTORIN) 10-40 MG per tablet Take 1 tablet by mouth at bedtime.        . fexofenadine (ALLEGRA) 180 MG tablet Take 180 mg by mouth daily.        . fish oil-omega-3 fatty acids 1000 MG capsule Take 1 g by mouth daily.        Marland Kitchen omeprazole-sodium bicarbonate (ZEGERID) 40-1100 MG per capsule Take 1 capsule by mouth 2 (two) times daily.        . Tamsulosin HCl (FLOMAX) 0.4 MG CAPS Take 0.4 mg by mouth daily.        Marland Kitchen warfarin (COUMADIN) 5 MG tablet Take 5 mg by mouth daily.        Allergies  Allergen Reactions  . Penicillins     Past Medical History  Diagnosis Date  . GERD (gastroesophageal reflux disease)   . Pruritus   . History of rheumatic fever   . Increased prostate specific antigen (PSA) velocity   . Anemia   . Allergic rhinitis   . Palpitations   . Mitral valve prolapse   . Hiatal hernia     small  . Hyperlipidemia   . Coronary artery disease     nonobstructive    ROS: Negative except  as per HPI  BP 110/66  Pulse 60  Ht 5\' 8"  (1.727 m)  Wt 82.01 kg (180 lb 12.8 oz)  BMI 27.49 kg/m2  PHYSICAL EXAM: Pt is alert and oriented, NAD HEENT: normal Neck: JVP - normal, carotids 2+= without bruits Lungs: CTA bilaterally CV: RRR without murmur or gallop Abd: soft, NT, Positive BS, no hepatomegaly Ext: no C/C/E, distal pulses intact and equal Skin: warm/dry no rash  ASSESSMENT AND PLAN:

## 2011-07-12 NOTE — Assessment & Plan Note (Signed)
Stable microvascular angina, currently doing well on medical therapy.

## 2011-07-12 NOTE — Assessment & Plan Note (Signed)
Full review of the patient's hypercoagulable panel today. Plan is to complete a total of 6 months of full anticoagulation with warfarin. He will have a repeat venous duplex before he stops warfarin and this will be scheduled in 2-3 months. At some point down the road we may consider an echocardiogram to evaluate pulmonary pressures.

## 2011-07-12 NOTE — Patient Instructions (Signed)
Your physician wants you to follow-up in: 6 months.  You will receive a reminder letter in the mail two months in advance. If you don't receive a letter, please call our office to schedule the follow-up appointment.  Your physician has requested that you have a lower extremity venous duplex. This test is an ultrasound of the veins in the legs or arms. It looks at venous blood flow that carries blood from the heart to the legs. Allow one hour for a Lower Venous exam. . There are no restrictions or special instructions. To be done in 2-3 months.

## 2011-07-20 ENCOUNTER — Other Ambulatory Visit: Payer: Self-pay | Admitting: Cardiovascular Disease

## 2011-07-20 MED ORDER — WARFARIN SODIUM 5 MG PO TABS: ORAL_TABLET | ORAL | Status: DC

## 2011-07-20 NOTE — Telephone Encounter (Signed)
This pt has his INR followed at [facility].

## 2011-07-24 ENCOUNTER — Other Ambulatory Visit: Payer: Self-pay | Admitting: Pharmacist

## 2011-07-24 DIAGNOSIS — I2699 Other pulmonary embolism without acute cor pulmonale: Secondary | ICD-10-CM

## 2011-08-09 ENCOUNTER — Other Ambulatory Visit (HOSPITAL_COMMUNITY): Payer: Self-pay | Admitting: Internal Medicine

## 2011-08-09 ENCOUNTER — Other Ambulatory Visit: Payer: Self-pay | Admitting: *Deleted

## 2011-09-14 ENCOUNTER — Encounter: Payer: 59 | Admitting: *Deleted

## 2011-09-14 ENCOUNTER — Encounter (INDEPENDENT_AMBULATORY_CARE_PROVIDER_SITE_OTHER): Payer: 59

## 2011-09-14 DIAGNOSIS — I82409 Acute embolism and thrombosis of unspecified deep veins of unspecified lower extremity: Secondary | ICD-10-CM

## 2011-09-15 ENCOUNTER — Telehealth: Payer: Self-pay | Admitting: Cardiovascular Disease

## 2011-09-15 DIAGNOSIS — R944 Abnormal results of kidney function studies: Secondary | ICD-10-CM

## 2011-09-15 NOTE — Telephone Encounter (Signed)
Spoke with Dr Caryn Section. He recommended a urinalysis, then a 24 hour urine collection. If UA has no protein, 24 hour urine for creatinine. If UA has protein, 24 hour urine for creatinine and protein.

## 2011-09-15 NOTE — Telephone Encounter (Signed)
I spoke with Dr Arline Asp who called with concern about increased creatinine. His labs from [facility] apparently showed a BUN/CR of 16/1.5. Previously has been in range of 1.0-1.3. He is on no nephrotoxic meds. Will order renal ultrasound and refer to Dr Caryn Section with nephrology for evaluation.

## 2011-09-18 ENCOUNTER — Telehealth: Payer: Self-pay | Admitting: *Deleted

## 2011-09-18 NOTE — Telephone Encounter (Deleted)
error 

## 2011-09-18 NOTE — Telephone Encounter (Signed)
error 

## 2011-09-18 NOTE — Telephone Encounter (Signed)
-----   Message from Tonny Bollman, MD sent at 09/15/2011 9:35 AM -----  Leotis Shames - can you order a renal ultrasound (regular ultrasound rather than an arterial duplex) and refer to Marina Gravel? thx  Order placed for Urinalysis, Renal Ultrasound and referral to Dr Caryn Section.

## 2011-09-18 NOTE — Telephone Encounter (Signed)
Nathaniel [name] has been in contact with the pt about appointments.

## 2011-09-21 ENCOUNTER — Other Ambulatory Visit: Payer: Self-pay

## 2011-10-04 ENCOUNTER — Other Ambulatory Visit (HOSPITAL_COMMUNITY): Payer: 59

## 2011-10-06 ENCOUNTER — Other Ambulatory Visit: Payer: Self-pay

## 2011-10-06 ENCOUNTER — Other Ambulatory Visit (INDEPENDENT_AMBULATORY_CARE_PROVIDER_SITE_OTHER): Payer: 59

## 2011-10-06 DIAGNOSIS — N289 Disorder of kidney and ureter, unspecified: Secondary | ICD-10-CM

## 2011-10-06 LAB — URINALYSIS, ROUTINE W REFLEX MICROSCOPIC
Bilirubin Urine: NEGATIVE
Hgb urine dipstick: NEGATIVE
Ketones, ur: NEGATIVE
Leukocytes, UA: NEGATIVE
Urine Glucose: NEGATIVE
Urobilinogen, UA: 0.2 (ref 0.0–1.0)

## 2011-10-11 ENCOUNTER — Ambulatory Visit (HOSPITAL_COMMUNITY)
Admission: RE | Admit: 2011-10-11 | Discharge: 2011-10-11 | Disposition: A | Discharge status: Home / Self Care | Payer: 59 | Source: Ambulatory Visit | Attending: Cardiovascular Disease | Admitting: Cardiovascular Disease

## 2011-10-11 DIAGNOSIS — Q619 Cystic kidney disease, unspecified: Secondary | ICD-10-CM | POA: Insufficient documentation

## 2011-10-11 DIAGNOSIS — R944 Abnormal results of kidney function studies: Secondary | ICD-10-CM | POA: Insufficient documentation

## 2011-10-12 ENCOUNTER — Other Ambulatory Visit: Payer: Self-pay

## 2011-10-12 ENCOUNTER — Other Ambulatory Visit: Payer: Self-pay | Admitting: *Deleted

## 2011-10-12 ENCOUNTER — Other Ambulatory Visit (INDEPENDENT_AMBULATORY_CARE_PROVIDER_SITE_OTHER): Payer: 59

## 2011-10-12 DIAGNOSIS — N289 Disorder of kidney and ureter, unspecified: Secondary | ICD-10-CM

## 2011-10-12 LAB — RENAL FUNCTION PANEL
Glucose, Bld: 99 mg/dL (ref 70–99)
Phosphorus: 2 mg/dL — ABNORMAL LOW (ref 2.3–4.6)
Potassium: 4.4 mEq/L (ref 3.5–5.1)
Sodium: 141 mEq/L (ref 135–145)

## 2011-10-16 ENCOUNTER — Other Ambulatory Visit: Payer: 59

## 2011-10-16 DIAGNOSIS — N289 Disorder of kidney and ureter, unspecified: Secondary | ICD-10-CM

## 2011-10-17 LAB — CREATININE, URINE, 24 HOUR
Creatinine, 24H Ur: 1718 mg/d (ref 800–2000)
Creatinine, Urine: 171.8 mg/dL

## 2011-11-09 ENCOUNTER — Telehealth: Payer: Self-pay | Admitting: *Deleted

## 2011-11-09 ENCOUNTER — Other Ambulatory Visit: Payer: Self-pay

## 2011-11-09 DIAGNOSIS — I82409 Acute embolism and thrombosis of unspecified deep veins of unspecified lower extremity: Secondary | ICD-10-CM

## 2011-11-09 NOTE — Telephone Encounter (Signed)
Message copied by Connye Burkitt on Thu Nov 09, 2011  3:08 PM ------      Message from: Iona Coach      Created: Thu Nov 09, 2011  2:30 PM      Regarding: FW: Echo       Dr Excell Seltzer would like the pt to have an Echo prior to August appt      ----- Message -----         From: Sharyn Blitz, RN         Sent: 11/08/2011   6:05 PM           To: Sharyn Blitz, RN, Tonny Bollman, MD      Subject: Echo                                                                 ----- Message -----         From: Connye Burkitt, NT         Sent: 11/08/2011   4:51 PM           To: Sharyn Blitz, RN      Subject: ECHO                                                     LAUREN, CHECK TO SEE IF DR. Excell Seltzer WOULD LIKE FOR [name] TO HAVE ECHO IN AUG WITH OV.

## 2011-12-21 ENCOUNTER — Other Ambulatory Visit (HOSPITAL_COMMUNITY): Payer: Self-pay | Admitting: Neurology

## 2011-12-21 DIAGNOSIS — R209 Unspecified disturbances of skin sensation: Secondary | ICD-10-CM

## 2011-12-25 ENCOUNTER — Ambulatory Visit (HOSPITAL_COMMUNITY)
Admission: RE | Admit: 2011-12-25 | Discharge: 2011-12-25 | Disposition: A | Discharge status: Home / Self Care | Payer: 59 | Source: Ambulatory Visit | Attending: Neurology | Admitting: Neurology

## 2011-12-25 DIAGNOSIS — R209 Unspecified disturbances of skin sensation: Secondary | ICD-10-CM | POA: Insufficient documentation

## 2011-12-25 MED ORDER — GADOBENATE DIMEGLUMINE 529 MG/ML IV SOLN
16.0000 mL | Freq: Once | INTRAVENOUS | Status: AC | PRN
  Administered 2011-12-25: 16 mL via INTRAVENOUS

## 2011-12-26 ENCOUNTER — Inpatient Hospital Stay (HOSPITAL_COMMUNITY): Admission: RE | Admit: 2011-12-26 | Payer: 59 | Source: Ambulatory Visit

## 2011-12-26 LAB — POCT I-STAT, CHEM 8
Chloride: 103 mEq/L (ref 96–112)
HCT: 40 % (ref 39.0–52.0)
Potassium: 4.2 mEq/L (ref 3.5–5.1)

## 2012-01-31 ENCOUNTER — Ambulatory Visit (HOSPITAL_COMMUNITY): Payer: 59 | Attending: Cardiovascular Disease | Admitting: Radiology

## 2012-01-31 ENCOUNTER — Encounter: Payer: Self-pay | Admitting: Cardiovascular Disease

## 2012-01-31 ENCOUNTER — Ambulatory Visit (INDEPENDENT_AMBULATORY_CARE_PROVIDER_SITE_OTHER): Payer: 59 | Admitting: Cardiovascular Disease

## 2012-01-31 VITALS — BP 111/67 | HR 50 | Ht 68.0 in | Wt 174.0 lb

## 2012-01-31 DIAGNOSIS — I351 Nonrheumatic aortic (valve) insufficiency: Secondary | ICD-10-CM | POA: Insufficient documentation

## 2012-01-31 DIAGNOSIS — I82409 Acute embolism and thrombosis of unspecified deep veins of unspecified lower extremity: Secondary | ICD-10-CM

## 2012-01-31 DIAGNOSIS — I2510 Atherosclerotic heart disease of native coronary artery without angina pectoris: Secondary | ICD-10-CM | POA: Insufficient documentation

## 2012-01-31 DIAGNOSIS — I079 Rheumatic tricuspid valve disease, unspecified: Secondary | ICD-10-CM | POA: Insufficient documentation

## 2012-01-31 DIAGNOSIS — I359 Nonrheumatic aortic valve disorder, unspecified: Secondary | ICD-10-CM

## 2012-01-31 DIAGNOSIS — I379 Nonrheumatic pulmonary valve disorder, unspecified: Secondary | ICD-10-CM | POA: Insufficient documentation

## 2012-01-31 DIAGNOSIS — I2699 Other pulmonary embolism without acute cor pulmonale: Secondary | ICD-10-CM | POA: Insufficient documentation

## 2012-01-31 DIAGNOSIS — I080 Rheumatic disorders of both mitral and aortic valves: Secondary | ICD-10-CM | POA: Insufficient documentation

## 2012-01-31 NOTE — Progress Notes (Signed)
Echocardiogram performed.  

## 2012-01-31 NOTE — Assessment & Plan Note (Signed)
Mild nonobstructive CAD. CCS Class 1-2 anginal symptoms. Continue observation - recommend follow-up in one year.Vytorin for risk reduction.

## 2012-01-31 NOTE — Assessment & Plan Note (Signed)
Echo was done today to eval pulmonary pressures considering his history of PE. Pulmonary pressures are estimated in the normal range. However, aortic root is mildly dilated and he has mild AI. We discussed the implications of this and I have recommended a follow-up echo in 12 months. He has no history of HTN and his prior CT was reviewed, demonstrating borderline aortic dimensions in the past.

## 2012-01-31 NOTE — Progress Notes (Signed)
   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  [name] has microvascular angina and history of DVT. He has been off oc warfarin now for several months without signs of post-phlebitic syndrome. Overall he is doing well. His angina has been less pronounced of late. He has occasional episodes when getting in a big hurry, but no symptoms with exercise or emotional stress. He has mild dyspnea with exertion, unchanged over time. No palpitations, orthopnea, or PND. No other complaints.  Outpatient Encounter Prescriptions as of 01/31/2012  Medication Sig Dispense Refill  . cholecalciferol (VITAMIN D) 1000 UNITS tablet Take 1,000 Units by mouth daily.        . fexofenadine (ALLEGRA) 180 MG tablet Take 180 mg by mouth daily.        . fish oil-omega-3 fatty acids 1000 MG capsule Take 1 g by mouth daily.        Marland Kitchen omeprazole-sodium bicarbonate (ZEGERID) 40-1100 MG per capsule Take 1 capsule by mouth 2 (two) times daily.        . Tamsulosin HCl (FLOMAX) 0.4 MG CAPS Take 0.4 mg by mouth daily.        Marland Kitchen VYTORIN 10-40 MG per tablet TAKE 1 TABLET BY MOUTH AT BEDTIME  90 tablet  1    Allergies  Allergen Reactions  . Penicillins     Past Medical History  Diagnosis Date  . GERD (gastroesophageal reflux disease)   . Pruritus   . History of rheumatic fever   . Increased prostate specific antigen (PSA) velocity   . Anemia   . Allergic rhinitis   . Palpitations   . Mitral valve prolapse   . Hiatal hernia     small  . Hyperlipidemia   . Coronary artery disease     nonobstructive    ROS: Negative except as per HPI  BP 111/67  Pulse 50  Ht 5\' 8"  (1.727 m)  Wt 78.926 kg (174 lb)  BMI 26.46 kg/m2  PHYSICAL EXAM: Pt is alert and oriented, NAD HEENT: normal Neck: JVP - normal, carotids 2+= without bruits Lungs: CTA bilaterally CV: RRR without murmur or gallop Abd: soft, NT, Positive BS, no hepatomegaly Ext: no C/C/E, distal pulses intact and equal Skin: warm/dry no  rash  EKG:  Marked sinus bradycardia 49 beats per minute, nonspecific ST abnormality.  ASSESSMENT AND PLAN:

## 2012-01-31 NOTE — Patient Instructions (Addendum)
Your physician wants you to follow-up in: 1 year after your ECHO.  You will receive a reminder letter in the mail two months in advance. If you don't receive a letter, please call our office to schedule the follow-up appointment.  Your physician has requested that you have an echocardiogram in one year just prior to appt with Dr. Excell Seltzer.. Echocardiography is a painless test that uses sound waves to create images of your heart. It provides your doctor with information about the size and shape of your heart and how well your heart's chambers and valves are working. This procedure takes approximately one hour. There are no restrictions for this procedure.

## 2012-02-01 ENCOUNTER — Encounter (HOSPITAL_COMMUNITY): Payer: Self-pay | Admitting: Cardiovascular Disease

## 2012-02-06 ENCOUNTER — Telehealth: Payer: Self-pay | Admitting: Cardiovascular Disease

## 2012-02-06 NOTE — Telephone Encounter (Signed)
Copy of results mailed to the pt per his request.

## 2012-02-06 NOTE — Telephone Encounter (Signed)
Pt request results of his Echo be mailed to him at hm address which has been verified.

## 2012-05-15 ENCOUNTER — Other Ambulatory Visit (HOSPITAL_COMMUNITY): Payer: Self-pay | Admitting: Internal Medicine

## 2012-05-16 NOTE — Telephone Encounter (Signed)
Pt calling almost out, requesting to get med called in within the next day or two

## 2012-05-20 ENCOUNTER — Other Ambulatory Visit: Payer: Self-pay | Admitting: *Deleted

## 2012-05-20 MED ORDER — EZETIMIBE-SIMVASTATIN 10-40 MG PO TABS: 1.0000 | ORAL_TABLET | Freq: Every day | ORAL | Status: DC

## 2012-06-15 ENCOUNTER — Encounter: Payer: Self-pay | Admitting: Oncology

## 2012-11-20 ENCOUNTER — Other Ambulatory Visit: Payer: Self-pay

## 2012-11-20 MED ORDER — EZETIMIBE-SIMVASTATIN 10-40 MG PO TABS: 1.0000 | ORAL_TABLET | Freq: Every day | ORAL | Status: DC

## 2013-02-05 ENCOUNTER — Encounter (HOSPITAL_COMMUNITY): Payer: Self-pay | Admitting: Pharmacy Technician

## 2013-02-06 ENCOUNTER — Encounter (HOSPITAL_COMMUNITY): Payer: Self-pay

## 2013-02-06 ENCOUNTER — Ambulatory Visit (HOSPITAL_COMMUNITY)
Admission: RE | Admit: 2013-02-06 | Discharge: 2013-02-06 | Disposition: A | Discharge status: Home / Self Care | Payer: 59 | Source: Ambulatory Visit | Attending: Gastroenterology | Admitting: Gastroenterology

## 2013-02-06 ENCOUNTER — Encounter (HOSPITAL_COMMUNITY)
Admission: RE | Disposition: A | Discharge status: Home / Self Care | Payer: Self-pay | Source: Ambulatory Visit | Attending: Gastroenterology

## 2013-02-06 DIAGNOSIS — K589 Irritable bowel syndrome without diarrhea: Secondary | ICD-10-CM | POA: Insufficient documentation

## 2013-02-06 DIAGNOSIS — K449 Diaphragmatic hernia without obstruction or gangrene: Secondary | ICD-10-CM | POA: Insufficient documentation

## 2013-02-06 DIAGNOSIS — K219 Gastro-esophageal reflux disease without esophagitis: Secondary | ICD-10-CM | POA: Insufficient documentation

## 2013-02-06 DIAGNOSIS — Z800 Family history of malignant neoplasm of digestive organs: Secondary | ICD-10-CM | POA: Insufficient documentation

## 2013-02-06 DIAGNOSIS — Z79899 Other long term (current) drug therapy: Secondary | ICD-10-CM | POA: Insufficient documentation

## 2013-02-06 DIAGNOSIS — N400 Enlarged prostate without lower urinary tract symptoms: Secondary | ICD-10-CM | POA: Insufficient documentation

## 2013-02-06 HISTORY — PX: ESOPHAGOGASTRODUODENOSCOPY: SHX5428

## 2013-02-06 SURGERY — EGD (ESOPHAGOGASTRODUODENOSCOPY)
Anesthesia: Moderate Sedation

## 2013-02-06 MED ORDER — SODIUM CHLORIDE 0.9 % IV SOLN
INTRAVENOUS | Status: DC
  Administered 2013-02-06: 09:00:00 via INTRAVENOUS

## 2013-02-06 MED ORDER — MEPERIDINE HCL 25 MG/ML IJ SOLN
INTRAMUSCULAR | Status: DC | PRN
  Administered 2013-02-06 (×2): 25 mg via INTRAVENOUS

## 2013-02-06 MED ORDER — MIDAZOLAM HCL 5 MG/ML IJ SOLN
INTRAMUSCULAR | Status: AC
  Filled 2013-02-06: qty 2

## 2013-02-06 MED ORDER — MIDAZOLAM HCL 10 MG/2ML IJ SOLN
INTRAMUSCULAR | Status: DC | PRN
  Administered 2013-02-06 (×3): 2 mg via INTRAVENOUS

## 2013-02-06 MED ORDER — BUTAMBEN-TETRACAINE-BENZOCAINE 2-2-14 % EX AERO
INHALATION_SPRAY | CUTANEOUS | Status: DC | PRN
  Administered 2013-02-06: 2 via TOPICAL

## 2013-02-06 MED ORDER — MEPERIDINE HCL 100 MG/ML IJ SOLN
INTRAMUSCULAR | Status: AC
  Filled 2013-02-06: qty 1

## 2013-02-06 MED ORDER — DIPHENHYDRAMINE HCL 50 MG/ML IJ SOLN
INTRAMUSCULAR | Status: AC
  Filled 2013-02-06: qty 1

## 2013-02-06 MED ORDER — FENTANYL CITRATE 0.05 MG/ML IJ SOLN
INTRAMUSCULAR | Status: AC
  Filled 2013-02-06: qty 2

## 2013-02-06 NOTE — H&P (Signed)
  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  Brother died of adenocarcinoma of GE junction.  Recent non-specific abd discomfort & bloating, somewhat relieved by BM's.  PMH:  Allergies:  PCN and Fentanyl Meds: Zegerid, Omega-3, Vit D, Vit B12, Vytorin, Flomax, ron Operns: shoulder, tonsillectomy--no abd operns Illnesses:  GERD, BPH, IBS  PE:  VS nl  Chest clr, heart nl, abd w/out guarding, mass, tenderness.  IMPR:  GERD exacerbation             Recent (2 wks) abd discomfort (?IBS)  PLAN:  egd (no Fentanyl)             Align probiotic  Florencia Reasons, M.D. [phone]

## 2013-02-06 NOTE — Op Note (Signed)
[facility] [address] [place], [address]   ENDOSCOPY PROCEDURE REPORT  PATIENT: Nathaniel [name], Nathaniel [name]  MR#: [id number] BIRTHDATE: 1946/02/18 , 67  yrs. old GENDER: Male ENDOSCOPIST:Chaley Castellanos, MD REFERRED BY:  Illene Regulus PROCEDURE DATE:  02/06/2013 PROCEDURE:      upper endoscopy ASA CLASS: INDICATIONS:   recent intensification of reflux symptoms, despite high-dose PPI therapy. Brother died of adenocarcinoma of the GE junction MEDICATION:    Demerol 50 mg IV (patient has allergy to fentanyl), Versed 6 mg IV TOPICAL ANESTHETIC:    Cetacaine spray  DESCRIPTION OF PROCEDURE:   the patient came as an outpatient to the [facility] cone endoscopy unit, and provided written consent. Time out was performed. He tolerated the sedation well and remained stable throughout the procedure.  The Pentax adult video endoscope was passed under direct vision. The larynx looked grossly normal, with perhaps some subtle thickening of the posterior commissure.  The esophagus was normal, specifically without evidence of Barrett's esophagus, reflux esophagitis, varices, infection, neoplasia, or any ring or stricture.  A 1 cm hiatal hernia was present.  The stomach was entered. It contained a small clear residual with perhaps a slight tinge of bile.  The gastric mucosa was entirely normal. No gastritis, erosions, ulcers, polyps, or masses were observed in the stomach. A retroflexed view of the cardia showed a tiny gap around the scope, consistent with a small hiatal hernia. The diaphragmatic hiatus was not patulous.  The pylorus, duodenal bulb, and second duodenum looked normal.  The scope was removed from the patient, who tolerated the procedure well. No biopsies were obtained.     COMPLICATIONS: None  ENDOSCOPIC IMPRESSION:  1. Minimal hiatal hernia 2. No overt reflux laryngitis; possible subtle changes present. 3. no adverse mucosal sequelae of  reflux, such as Barrett's esophagus, observed.  RECOMMENDATIONS:  1. Continue current therapy for now    _______________________________ eSigned:  [name], MD 02/06/2013 10:01 AM    PATIENT NAME:  Nathaniel [name], Nathaniel [name] MR#: [id number]

## 2013-02-07 ENCOUNTER — Encounter (HOSPITAL_COMMUNITY): Payer: Self-pay | Admitting: Gastroenterology

## 2013-02-12 ENCOUNTER — Encounter: Payer: Self-pay | Admitting: Cardiovascular Disease

## 2013-02-12 ENCOUNTER — Ambulatory Visit (INDEPENDENT_AMBULATORY_CARE_PROVIDER_SITE_OTHER): Payer: 59 | Admitting: Cardiovascular Disease

## 2013-02-12 VITALS — BP 108/74 | HR 45 | Ht 68.0 in | Wt 176.0 lb

## 2013-02-12 DIAGNOSIS — I359 Nonrheumatic aortic valve disorder, unspecified: Secondary | ICD-10-CM | POA: Diagnosis not present

## 2013-02-12 DIAGNOSIS — I2510 Atherosclerotic heart disease of native coronary artery without angina pectoris: Secondary | ICD-10-CM

## 2013-02-12 DIAGNOSIS — E785 Hyperlipidemia, unspecified: Secondary | ICD-10-CM | POA: Diagnosis not present

## 2013-02-12 DIAGNOSIS — Z79899 Other long term (current) drug therapy: Secondary | ICD-10-CM

## 2013-02-12 DIAGNOSIS — K219 Gastro-esophageal reflux disease without esophagitis: Secondary | ICD-10-CM

## 2013-02-12 LAB — CBC WITH DIFFERENTIAL/PLATELET
Eosinophils Relative: 7.2 % — ABNORMAL HIGH (ref 0.0–5.0)
HCT: 39.5 % (ref 39.0–52.0)
Hemoglobin: 13.3 g/dL (ref 13.0–17.0)
Lymphs Abs: 1 10*3/uL (ref 0.7–4.0)
Monocytes Relative: 11.5 % (ref 3.0–12.0)
Neutro Abs: 2.7 10*3/uL (ref 1.4–7.7)
RBC: 4.44 Mil/uL (ref 4.22–5.81)
WBC: 4.5 10*3/uL (ref 4.5–10.5)

## 2013-02-12 LAB — FERRITIN: Ferritin: 71.1 ng/mL (ref 22.0–322.0)

## 2013-02-12 LAB — MAGNESIUM: Magnesium: 2.2 mg/dL (ref 1.5–2.5)

## 2013-02-12 MED ORDER — ATORVASTATIN CALCIUM 20 MG PO TABS: 20.0000 mg | ORAL_TABLET | Freq: Every day | ORAL | Status: DC

## 2013-02-12 NOTE — Patient Instructions (Addendum)
Your physician recommends that you return for lab work in: 3 MONTHS--lipid and liver profile, nothing to eat or drink after midnight, lab opens at 7:30  Your physician has requested that you have an echocardiogram. Echocardiography is a painless test that uses sound waves to create images of your heart. It provides your doctor with information about the size and shape of your heart and how well your heart's chambers and valves are working. This procedure takes approximately one hour. There are no restrictions for this procedure.  Your physician has requested that you have an exercise tolerance test with Dr Excell Seltzer. For further information please visit [email or web address] Please also follow instruction sheet, as given.  Your physician has recommended you make the following change in your medication: STOP Vytorin, START Atorvastatin 20mg  take one by mouth every evening

## 2013-02-12 NOTE — Progress Notes (Signed)
   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 He is followed for microvascular angina, DVT, mild AI, and hyperlipidemia. He has had 3 episodes of chest discomfort, all associated with heavy physical exertion. No symptoms with moderate/light activities and he doesn't think there has been any significant change in anginal threshold. He denies dyspnea, edema, or palpitations. No other complaints. Last lipids from 2013 showed a cholesterol of 108, LDL 46, and HDL 34.   Outpatient Encounter Prescriptions as of 02/12/2013  Medication Sig Dispense Refill  . cholecalciferol (VITAMIN D) 1000 UNITS tablet Take 1,000 Units by mouth every morning.       . Cyanocobalamin (VITAMIN B-12) 2500 MCG SUBL Place 1 tablet under the tongue every 7 (seven) days. Takes on Wednesday      . ezetimibe-simvastatin (VYTORIN) 10-40 MG per tablet Take 1 tablet by mouth at bedtime.      . ferrous sulfate 325 (65 FE) MG tablet Take 325 mg by mouth daily with breakfast.      . fish oil-omega-3 fatty acids 1000 MG capsule Take 1 g by mouth daily.        Marland Kitchen omeprazole-sodium bicarbonate (ZEGERID) 40-1100 MG per capsule Take 1 capsule by mouth 2 (two) times daily.        . Tamsulosin HCl (FLOMAX) 0.4 MG CAPS Take 0.4 mg by mouth at bedtime.        No facility-administered encounter medications on file as of 02/12/2013.    Allergies  Allergen Reactions  . Fentanyl Nausea And Vomiting  . Penicillins Swelling    Hands swell    Past Medical History  Diagnosis Date  . GERD (gastroesophageal reflux disease)   . Pruritus   . History of rheumatic fever   . Increased prostate specific antigen (PSA) velocity   . Anemia   . Allergic rhinitis   . Palpitations   . Mitral valve prolapse   . Hiatal hernia     small  . Hyperlipidemia   . Coronary artery disease     nonobstructive    ROS: Negative except as per HPI  BP 108/74  Pulse 45  Ht 5\' 8"   (1.727 m)  Wt 176 lb (79.833 kg)  BMI 26.77 kg/m2  SpO2 97%  PHYSICAL EXAM: Pt is alert and oriented, NAD HEENT: normal Neck: JVP - normal, carotids 2+= without bruits Lungs: CTA bilaterally CV: RRR without murmur or gallop Abd: soft, NT, Positive BS, no hepatomegaly Ext: no C/C/E, distal pulses intact and equal Skin: warm/dry no rash  EKG:  Marked sinus bradycardia 45 bpm, nonspecific ST and T wave abnormality  ASSESSMENT AND PLAN: 1. Microvascular angina, CCS Class 2. Nathaniel [name] is stable and should continue his current medical therapy which was reviewed today.   2. Hyperlipidemia. Cost has become an issue with Vytorin. Will change to atorvastatin 20 mg daily.  3. Mild aortic insufficiency and mildly dilated aortic root. Repeat 2 D echo. Not able to appreciate AI on exam.   4. Bradycardia, marked. Will do an exercise treadmill to eval chronotropic response to exercise and reassess anginal threshold.   Tonny Bollman 02/14/2013 5:52 PM

## 2013-02-14 ENCOUNTER — Encounter: Payer: Self-pay | Admitting: Cardiovascular Disease

## 2013-02-18 ENCOUNTER — Ambulatory Visit (HOSPITAL_COMMUNITY): Payer: Medicare Other | Attending: Cardiovascular Disease

## 2013-02-18 ENCOUNTER — Ambulatory Visit (INDEPENDENT_AMBULATORY_CARE_PROVIDER_SITE_OTHER): Payer: 59 | Admitting: Cardiovascular Disease

## 2013-02-18 ENCOUNTER — Encounter: Payer: Self-pay | Admitting: Cardiovascular Disease

## 2013-02-18 DIAGNOSIS — I379 Nonrheumatic pulmonary valve disorder, unspecified: Secondary | ICD-10-CM | POA: Diagnosis not present

## 2013-02-18 DIAGNOSIS — I359 Nonrheumatic aortic valve disorder, unspecified: Secondary | ICD-10-CM | POA: Diagnosis not present

## 2013-02-18 DIAGNOSIS — I079 Rheumatic tricuspid valve disease, unspecified: Secondary | ICD-10-CM | POA: Insufficient documentation

## 2013-02-18 DIAGNOSIS — E785 Hyperlipidemia, unspecified: Secondary | ICD-10-CM | POA: Insufficient documentation

## 2013-02-18 DIAGNOSIS — I319 Disease of pericardium, unspecified: Secondary | ICD-10-CM | POA: Diagnosis not present

## 2013-02-18 DIAGNOSIS — R002 Palpitations: Secondary | ICD-10-CM | POA: Diagnosis not present

## 2013-02-18 DIAGNOSIS — I080 Rheumatic disorders of both mitral and aortic valves: Secondary | ICD-10-CM | POA: Insufficient documentation

## 2013-02-18 DIAGNOSIS — I2510 Atherosclerotic heart disease of native coronary artery without angina pectoris: Secondary | ICD-10-CM | POA: Diagnosis not present

## 2013-02-18 NOTE — Progress Notes (Signed)
Echocardiogram performed.  

## 2013-02-18 NOTE — Progress Notes (Signed)
Exercise Treadmill Test  Pre-Exercise Testing Evaluation Rhythm: sinus bradycardia  Rate: 41     Test  Exercise Tolerance Test Ordering MD: Tonny Bollman, MD  Interpreting MD: Tonny Bollman, MD  Unique Test No: 1  Treadmill:  1  Indication for ETT: known ASHD  Contraindication to ETT: No   Stress Modality: exercise - treadmill  Cardiac Imaging Performed: non   Protocol: standard Bruce - maximal  Max BP:  206/81  Max MPHR (bpm):  152 85% MPR (bpm):  129  MPHR obtained (bpm):  131 % MPHR obtained:  86  Reached 85% MPHR (min:sec):  8:15 Total Exercise Time (min-sec):  8:46  Workload in METS:  10.4 Borg Scale: 17  Reason ETT Terminated:  dyspnea    ST Segment Analysis At Rest: normal ST segments - no evidence of significant ST depression With Exercise: significant ischemic ST depression  Other Information Arrhythmia:  No Angina during ETT:  present (1) Quality of ETT:  diagnostic  ETT Interpretation:  abnormal - evidence of ST depression consistent with ischemia  Comments: Good exercise tolerance. There is mild angina and a positive stress ECG response with exertion. Isolated PVC's noted.   Recommendations: Exercise stress Myoview.

## 2013-02-19 ENCOUNTER — Telehealth: Payer: Self-pay | Admitting: *Deleted

## 2013-02-19 DIAGNOSIS — R079 Chest pain, unspecified: Secondary | ICD-10-CM

## 2013-02-19 DIAGNOSIS — R9439 Abnormal result of other cardiovascular function study: Secondary | ICD-10-CM

## 2013-02-19 NOTE — Telephone Encounter (Signed)
Dr Excell Seltzer has already spoke with the pt/ orders followed. Pt to be called to schedule stress myoview, papers taken to [facility].

## 2013-02-20 ENCOUNTER — Encounter: Payer: Self-pay | Admitting: Cardiovascular Disease

## 2013-03-05 ENCOUNTER — Ambulatory Visit (HOSPITAL_COMMUNITY): Payer: Medicare Other | Attending: Cardiovascular Disease | Admitting: Radiology

## 2013-03-05 VITALS — BP 118/69 | HR 38 | Ht 68.0 in | Wt 175.0 lb

## 2013-03-05 DIAGNOSIS — R0609 Other forms of dyspnea: Secondary | ICD-10-CM | POA: Diagnosis not present

## 2013-03-05 DIAGNOSIS — R0602 Shortness of breath: Secondary | ICD-10-CM | POA: Diagnosis not present

## 2013-03-05 DIAGNOSIS — R9439 Abnormal result of other cardiovascular function study: Secondary | ICD-10-CM

## 2013-03-05 DIAGNOSIS — R0789 Other chest pain: Secondary | ICD-10-CM | POA: Insufficient documentation

## 2013-03-05 DIAGNOSIS — R9431 Abnormal electrocardiogram [ECG] [EKG]: Secondary | ICD-10-CM

## 2013-03-05 DIAGNOSIS — E785 Hyperlipidemia, unspecified: Secondary | ICD-10-CM | POA: Insufficient documentation

## 2013-03-05 DIAGNOSIS — R002 Palpitations: Secondary | ICD-10-CM | POA: Insufficient documentation

## 2013-03-05 DIAGNOSIS — R079 Chest pain, unspecified: Secondary | ICD-10-CM

## 2013-03-05 DIAGNOSIS — R0989 Other specified symptoms and signs involving the circulatory and respiratory systems: Secondary | ICD-10-CM | POA: Insufficient documentation

## 2013-03-05 DIAGNOSIS — Z8249 Family history of ischemic heart disease and other diseases of the circulatory system: Secondary | ICD-10-CM | POA: Diagnosis not present

## 2013-03-05 MED ORDER — TECHNETIUM TC 99M SESTAMIBI GENERIC - CARDIOLITE
10.0000 | Freq: Once | INTRAVENOUS | Status: AC | PRN
  Administered 2013-03-05: 10 via INTRAVENOUS

## 2013-03-05 MED ORDER — TECHNETIUM TC 99M SESTAMIBI GENERIC - CARDIOLITE
30.0000 | Freq: Once | INTRAVENOUS | Status: AC | PRN
  Administered 2013-03-05: 30 via INTRAVENOUS

## 2013-03-05 NOTE — Progress Notes (Signed)
[facility] SITE 3 NUCLEAR MED [address] [place], [place] [address] [phone]    Cardiology Nuclear Med Study  Nathaniel [name] is a 67 y.o. male     MRN : [id number]     DOB: 1945/11/04  Procedure Date: 03/05/2013  Nuclear Med Background Indication for Stress Test:  Evaluation for Ischemia and Abnormal GXT History:'06 MPS:no ischemia; '08 Cath:n/o CAD, EF=55%; 9/14 Echo:EF=65%; 9/14 [id number] Cardiac Risk Factors: Family History - CAD and Lipids  Symptoms:  Chest Pain with Exertion (last episode of chest discomfort was about 2-weeks ago), DOE/SOB and Palpitations    Nuclear Pre-Procedure Caffeine/Decaff Intake:  None > 12 hrs NPO After: 8:00pm   Lungs:  Clear. O2 Sat: 95% on room air. IV 0.9% NS with Angio Cath:  20g  IV Site: R Antecubital x 1, tolerated well IV Started by:  Irean Hong, RN  Chest Size (in):  40-42 Cup Size: n/a  Height: 5\' 8"  (1.727 m)  Weight:  175 lb (79.379 kg)  BMI:  Body mass index is 26.61 kg/(m^2). Tech Comments:  n/a    Nuclear Med Study 1 or 2 day study: 1 day  Stress Test Type:  Stress  Reading MD: Charlton Haws, MD  Order Authorizing Provider:  Tonny Bollman, MD  Resting Radionuclide: Technetium 68m Sestamibi  Resting Radionuclide Dose: 11.0 mCi   Stress Radionuclide:  Technetium 58m Sestamibi  Stress Radionuclide Dose: 33.0 mCi           Stress Protocol Rest HR: 38-40 Stress HR: 133  Rest BP: 118/69 Stress BP: 178/85  Exercise Time (min): 10:16 METS: 12.1   Predicted Max HR: 152 bpm % Max HR: 87.5 bpm Rate Pressure Product: 30160   Dose of Adenosine (mg):  n/a Dose of Lexiscan: n/a mg  Dose of Atropine (mg): n/a Dose of Dobutamine: n/a mcg/kg/min (at max HR)  Stress Test Technologist: Smiley Houseman, CMA-N  Nuclear Technologist:  Domenic Polite, CNMT     Rest Procedure:  Myocardial perfusion imaging was performed at rest 45 minutes following the intravenous administration of Technetium 81m Sestamibi.  Rest  ECG: NSR - Normal EKG  Stress Procedure:  The patient exercised on the treadmill utilizing the Bruce Protocol for 10:16 minutes. The patient stopped due to dyspnea and fatigue.  He also c/o chest pressure/burning with exercise.  Technetium 36m Sestamibi was injected at peak exercise and myocardial perfusion imaging was performed after a brief delay.  Stress ECG: No significant change from baseline ECG  QPS Raw Data Images:  Normal; no motion artifact; normal heart/lung ratio. Stress Images:  Normal homogeneous uptake in all areas of the myocardium. Rest Images:  Normal homogeneous uptake in all areas of the myocardium. Subtraction (SDS):  Normal Transient Ischemic Dilatation (Normal <1.22):  n/a Lung/Heart Ratio (Normal <0.45):  0.42  Quantitative Gated Spect Images QGS EDV:  110 ml QGS ESV:  46 ml  Impression Exercise Capacity:  Good exercise capacity. BP Response:  Normal blood pressure response. Clinical Symptoms:  Significant chest pain ECG Impression:  Lots of artifact during stress but no obvious horizontal ST segment depression Comparison with Prior Nuclear Study: No images to compare  Overall Impression:  Low risk stress nuclear study No ishcemia or infarct  Significant chest pain during exercise.  LV Ejection Fraction: 58%.  LV Wall Motion:  NL LV Function; NL Wall Motion   Charlton Haws

## 2013-03-17 ENCOUNTER — Encounter: Payer: Medicare Other | Admitting: Cardiovascular Disease

## 2013-03-17 ENCOUNTER — Other Ambulatory Visit (HOSPITAL_COMMUNITY): Payer: Medicare Other

## 2013-04-08 DIAGNOSIS — H903 Sensorineural hearing loss, bilateral: Secondary | ICD-10-CM | POA: Diagnosis not present

## 2013-04-08 DIAGNOSIS — H6120 Impacted cerumen, unspecified ear: Secondary | ICD-10-CM | POA: Diagnosis not present

## 2013-04-17 ENCOUNTER — Other Ambulatory Visit: Payer: Self-pay

## 2013-05-05 DIAGNOSIS — K219 Gastro-esophageal reflux disease without esophagitis: Secondary | ICD-10-CM | POA: Diagnosis not present

## 2013-05-15 ENCOUNTER — Other Ambulatory Visit (INDEPENDENT_AMBULATORY_CARE_PROVIDER_SITE_OTHER): Payer: Medicare Other

## 2013-05-15 DIAGNOSIS — E785 Hyperlipidemia, unspecified: Secondary | ICD-10-CM

## 2013-05-15 DIAGNOSIS — I2510 Atherosclerotic heart disease of native coronary artery without angina pectoris: Secondary | ICD-10-CM

## 2013-05-15 DIAGNOSIS — I359 Nonrheumatic aortic valve disorder, unspecified: Secondary | ICD-10-CM | POA: Diagnosis not present

## 2013-05-15 LAB — LIPID PANEL
HDL: 34.7 mg/dL — ABNORMAL LOW (ref >39.00)
Total CHOL/HDL Ratio: 4
Triglycerides: 77 mg/dL (ref 0.0–149.0)
VLDL: 15.4 mg/dL (ref 0.0–40.0)

## 2013-05-15 LAB — HEPATIC FUNCTION PANEL
AST: 22 U/L (ref 0–37)
Albumin: 4 g/dL (ref 3.5–5.2)
Total Bilirubin: 0.6 mg/dL (ref 0.3–1.2)

## 2013-05-23 DIAGNOSIS — M542 Cervicalgia: Secondary | ICD-10-CM | POA: Diagnosis not present

## 2013-05-23 DIAGNOSIS — K219 Gastro-esophageal reflux disease without esophagitis: Secondary | ICD-10-CM | POA: Diagnosis not present

## 2013-05-27 ENCOUNTER — Telehealth: Payer: Self-pay | Admitting: Internal Medicine

## 2013-05-27 NOTE — Telephone Encounter (Signed)
Rec'd from  Ear Nose and Throat forward 4 pages to Dr.Norins ° °

## 2013-06-30 ENCOUNTER — Telehealth: Payer: Self-pay | Admitting: Cardiovascular Disease

## 2013-06-30 NOTE — Telephone Encounter (Signed)
New Prob    Pt states he is having intermittent heart irregularity and would like to speak to nurse. Please call.

## 2013-06-30 NOTE — Telephone Encounter (Signed)
Called patient to inform ok for him to come by for EKG per Dr. Burt Knack if he feels an arrythmia.  Advised him that if we are unable to "catch" it in the EKG he will likely need to wear a monitor and that he should be seen rather soon.  Asked him to please call before he comes to office so we are able to prepare for his visit and so he wont have to wait as long in the waiting area.  Pt verbalizes understanding and agreement.

## 2013-06-30 NOTE — Telephone Encounter (Signed)
lmtcb

## 2013-06-30 NOTE — Telephone Encounter (Signed)
Pt having intermittent arrythmias lately.  When this happens, his rate drops into mid 30s, with his normal HR in 50s.  Bothersome, may last couple hours at times.  Not associated with any other symptoms, has not happened during exercise. Denies any recent illness.  Wanted to give him an appointment but patient asked that, since he lives close, could he come to office for EKG when he feels the arrythmia.  He wants to avoid needing a monitor.  Told him I would discuss with you.

## 2013-06-30 NOTE — Telephone Encounter (Signed)
Fine with me thx! 

## 2013-07-02 DIAGNOSIS — H903 Sensorineural hearing loss, bilateral: Secondary | ICD-10-CM | POA: Diagnosis not present

## 2013-07-02 DIAGNOSIS — H6120 Impacted cerumen, unspecified ear: Secondary | ICD-10-CM | POA: Diagnosis not present

## 2013-07-03 ENCOUNTER — Ambulatory Visit (INDEPENDENT_AMBULATORY_CARE_PROVIDER_SITE_OTHER): Payer: Medicare Other | Admitting: Nurse Practitioner

## 2013-07-03 VITALS — BP 124/78 | HR 49 | Resp 16

## 2013-07-03 DIAGNOSIS — R002 Palpitations: Secondary | ICD-10-CM | POA: Diagnosis not present

## 2013-07-03 DIAGNOSIS — I499 Cardiac arrhythmia, unspecified: Secondary | ICD-10-CM | POA: Diagnosis not present

## 2013-07-03 NOTE — Progress Notes (Signed)
Patient presents as walk-in with c/o irregular heart beat and palpitations onset several hours after exercise today.  Patient states this is different than his previous feeling of irregular heart rate and chest pain during exercise.  Patient states these episodes, which occurred Monday and today, occur several hours after exercise, even as much as 24 hours after.  Patient states the episodes last several hours and that his palpable heart rate during that time is approximately 35 bpm.  Patient came to the office today in hopes of being able to "catch" the arrhythmia on ekg.  When patient was taken to an exam room he stated that the episode had subsided.  Patient was alert and oriented to person, place time; able to ambulate without difficulty and in no acute distress; skin warm, dry and acyanotic.  Patient denies complaints except that he is concerned about the irregular heart rate and would like for [place] to be able to capture an episode on ekg.  I advised patient that the best way to capture these episodes per Dr. Burt Knack is by wearing a monitor.  Patient is opposed to wearing a 21 or 30 day event monitor, but after much consideration he is agreeable to wear a 48 hour monitor.  Patient asked if he could call me here and come in tomorrow if he experiences this feeling again.  I advised that he can call, however I cannot guarantee that I will be able to perform an ekg at the time that with this he runs the risk of not getting here in time to capture the arrhythmia.  Patient states that he prefers to do this rather than wear the monitor.  I advised that the monitor is still the best way to capture the most data and that it may reveal something that he does not even feel.  Patient remains hesitant but does agree to make an appointment for holter.  Order is in epic.  Patient was discharged in no acute distress.

## 2013-07-07 ENCOUNTER — Encounter (INDEPENDENT_AMBULATORY_CARE_PROVIDER_SITE_OTHER): Payer: Medicare Other

## 2013-07-07 ENCOUNTER — Encounter: Payer: Self-pay | Admitting: Radiology

## 2013-07-07 DIAGNOSIS — R002 Palpitations: Secondary | ICD-10-CM

## 2013-07-07 NOTE — Progress Notes (Signed)
Patient ID: Nathaniel [name], male   DOB: 1945/09/21, 68 y.o.   MRN: [id number] E cardio 48hr holter monitor applied

## 2013-07-11 ENCOUNTER — Telehealth: Payer: Self-pay | Admitting: Nurse Practitioner

## 2013-07-11 NOTE — Telephone Encounter (Signed)
Dr. Burt Knack called patient and reviewed monitor findings with him - Sinus bradycardia, Rare PVCs/PACs.  Patient verbalized understanding.

## 2013-08-04 DIAGNOSIS — M6530 Trigger finger, unspecified finger: Secondary | ICD-10-CM | POA: Diagnosis not present

## 2013-09-11 DIAGNOSIS — L821 Other seborrheic keratosis: Secondary | ICD-10-CM | POA: Diagnosis not present

## 2013-09-11 DIAGNOSIS — D0460 Carcinoma in situ of skin of unspecified upper limb, including shoulder: Secondary | ICD-10-CM | POA: Diagnosis not present

## 2013-09-11 DIAGNOSIS — D485 Neoplasm of uncertain behavior of skin: Secondary | ICD-10-CM | POA: Diagnosis not present

## 2013-12-08 DIAGNOSIS — G8929 Other chronic pain: Secondary | ICD-10-CM | POA: Diagnosis not present

## 2013-12-08 DIAGNOSIS — N411 Chronic prostatitis: Secondary | ICD-10-CM | POA: Diagnosis not present

## 2013-12-08 DIAGNOSIS — R972 Elevated prostate specific antigen [PSA]: Secondary | ICD-10-CM | POA: Diagnosis not present

## 2013-12-08 DIAGNOSIS — N400 Enlarged prostate without lower urinary tract symptoms: Secondary | ICD-10-CM | POA: Diagnosis not present

## 2013-12-08 DIAGNOSIS — R109 Unspecified abdominal pain: Secondary | ICD-10-CM | POA: Diagnosis not present

## 2013-12-08 DIAGNOSIS — N509 Disorder of male genital organs, unspecified: Secondary | ICD-10-CM | POA: Diagnosis not present

## 2013-12-08 DIAGNOSIS — R6882 Decreased libido: Secondary | ICD-10-CM | POA: Diagnosis not present

## 2013-12-19 DIAGNOSIS — R195 Other fecal abnormalities: Secondary | ICD-10-CM | POA: Diagnosis not present

## 2013-12-22 DIAGNOSIS — M6530 Trigger finger, unspecified finger: Secondary | ICD-10-CM | POA: Diagnosis not present

## 2014-01-01 ENCOUNTER — Other Ambulatory Visit: Payer: Self-pay | Admitting: Urology

## 2014-01-01 DIAGNOSIS — G8929 Other chronic pain: Secondary | ICD-10-CM

## 2014-01-01 DIAGNOSIS — R102 Pelvic and perineal pain: Principal | ICD-10-CM

## 2014-01-02 ENCOUNTER — Ambulatory Visit
Admission: RE | Admit: 2014-01-02 | Discharge: 2014-01-02 | Disposition: A | Discharge status: Home / Self Care | Payer: Medicare Other | Source: Ambulatory Visit | Attending: Urology | Admitting: Urology

## 2014-01-02 DIAGNOSIS — G8929 Other chronic pain: Secondary | ICD-10-CM

## 2014-01-02 DIAGNOSIS — N433 Hydrocele, unspecified: Secondary | ICD-10-CM | POA: Diagnosis not present

## 2014-01-02 DIAGNOSIS — R102 Pelvic and perineal pain: Principal | ICD-10-CM

## 2014-01-02 DIAGNOSIS — K4090 Unilateral inguinal hernia, without obstruction or gangrene, not specified as recurrent: Secondary | ICD-10-CM | POA: Diagnosis not present

## 2014-01-02 MED ORDER — GADOBENATE DIMEGLUMINE 529 MG/ML IV SOLN
15.0000 mL | Freq: Once | INTRAVENOUS | Status: AC | PRN
  Administered 2014-01-02: 15 mL via INTRAVENOUS

## 2014-01-05 ENCOUNTER — Other Ambulatory Visit: Payer: Medicare Other

## 2014-01-16 DIAGNOSIS — M7710 Lateral epicondylitis, unspecified elbow: Secondary | ICD-10-CM | POA: Diagnosis not present

## 2014-01-19 DIAGNOSIS — N400 Enlarged prostate without lower urinary tract symptoms: Secondary | ICD-10-CM | POA: Diagnosis not present

## 2014-01-19 DIAGNOSIS — G8929 Other chronic pain: Secondary | ICD-10-CM | POA: Diagnosis not present

## 2014-01-19 DIAGNOSIS — R109 Unspecified abdominal pain: Secondary | ICD-10-CM | POA: Diagnosis not present

## 2014-01-19 DIAGNOSIS — N411 Chronic prostatitis: Secondary | ICD-10-CM | POA: Diagnosis not present

## 2014-01-21 DIAGNOSIS — R109 Unspecified abdominal pain: Secondary | ICD-10-CM | POA: Diagnosis not present

## 2014-01-21 DIAGNOSIS — R279 Unspecified lack of coordination: Secondary | ICD-10-CM | POA: Diagnosis not present

## 2014-01-21 DIAGNOSIS — M62838 Other muscle spasm: Secondary | ICD-10-CM | POA: Diagnosis not present

## 2014-01-21 DIAGNOSIS — N509 Disorder of male genital organs, unspecified: Secondary | ICD-10-CM | POA: Diagnosis not present

## 2014-01-22 DIAGNOSIS — M7710 Lateral epicondylitis, unspecified elbow: Secondary | ICD-10-CM | POA: Diagnosis not present

## 2014-01-26 DIAGNOSIS — M7710 Lateral epicondylitis, unspecified elbow: Secondary | ICD-10-CM | POA: Diagnosis not present

## 2014-01-28 DIAGNOSIS — M62838 Other muscle spasm: Secondary | ICD-10-CM | POA: Diagnosis not present

## 2014-01-28 DIAGNOSIS — R279 Unspecified lack of coordination: Secondary | ICD-10-CM | POA: Diagnosis not present

## 2014-01-28 DIAGNOSIS — R3916 Straining to void: Secondary | ICD-10-CM | POA: Diagnosis not present

## 2014-01-28 DIAGNOSIS — N509 Disorder of male genital organs, unspecified: Secondary | ICD-10-CM | POA: Diagnosis not present

## 2014-01-29 DIAGNOSIS — M7710 Lateral epicondylitis, unspecified elbow: Secondary | ICD-10-CM | POA: Diagnosis not present

## 2014-02-04 DIAGNOSIS — M62838 Other muscle spasm: Secondary | ICD-10-CM | POA: Diagnosis not present

## 2014-02-04 DIAGNOSIS — N509 Disorder of male genital organs, unspecified: Secondary | ICD-10-CM | POA: Diagnosis not present

## 2014-02-04 DIAGNOSIS — M7710 Lateral epicondylitis, unspecified elbow: Secondary | ICD-10-CM | POA: Diagnosis not present

## 2014-02-04 DIAGNOSIS — R279 Unspecified lack of coordination: Secondary | ICD-10-CM | POA: Diagnosis not present

## 2014-02-04 DIAGNOSIS — R109 Unspecified abdominal pain: Secondary | ICD-10-CM | POA: Diagnosis not present

## 2014-02-05 DIAGNOSIS — L570 Actinic keratosis: Secondary | ICD-10-CM | POA: Diagnosis not present

## 2014-02-05 DIAGNOSIS — L820 Inflamed seborrheic keratosis: Secondary | ICD-10-CM | POA: Diagnosis not present

## 2014-02-05 DIAGNOSIS — Z85828 Personal history of other malignant neoplasm of skin: Secondary | ICD-10-CM | POA: Diagnosis not present

## 2014-02-05 DIAGNOSIS — L821 Other seborrheic keratosis: Secondary | ICD-10-CM | POA: Diagnosis not present

## 2014-02-11 DIAGNOSIS — M7710 Lateral epicondylitis, unspecified elbow: Secondary | ICD-10-CM | POA: Diagnosis not present

## 2014-02-12 DIAGNOSIS — D509 Iron deficiency anemia, unspecified: Secondary | ICD-10-CM | POA: Diagnosis not present

## 2014-02-12 DIAGNOSIS — M7710 Lateral epicondylitis, unspecified elbow: Secondary | ICD-10-CM | POA: Diagnosis not present

## 2014-02-12 DIAGNOSIS — N400 Enlarged prostate without lower urinary tract symptoms: Secondary | ICD-10-CM | POA: Diagnosis not present

## 2014-02-12 DIAGNOSIS — I209 Angina pectoris, unspecified: Secondary | ICD-10-CM | POA: Diagnosis not present

## 2014-02-12 DIAGNOSIS — N509 Disorder of male genital organs, unspecified: Secondary | ICD-10-CM | POA: Diagnosis not present

## 2014-02-12 DIAGNOSIS — M79609 Pain in unspecified limb: Secondary | ICD-10-CM | POA: Diagnosis not present

## 2014-02-12 DIAGNOSIS — Z79899 Other long term (current) drug therapy: Secondary | ICD-10-CM | POA: Diagnosis not present

## 2014-02-12 DIAGNOSIS — E7800 Pure hypercholesterolemia, unspecified: Secondary | ICD-10-CM | POA: Diagnosis not present

## 2014-02-12 DIAGNOSIS — IMO0001 Reserved for inherently not codable concepts without codable children: Secondary | ICD-10-CM | POA: Diagnosis not present

## 2014-02-18 DIAGNOSIS — M7710 Lateral epicondylitis, unspecified elbow: Secondary | ICD-10-CM | POA: Diagnosis not present

## 2014-02-20 ENCOUNTER — Ambulatory Visit (INDEPENDENT_AMBULATORY_CARE_PROVIDER_SITE_OTHER): Payer: Medicare Other | Admitting: Nurse Practitioner

## 2014-02-20 ENCOUNTER — Encounter: Payer: Self-pay | Admitting: Nurse Practitioner

## 2014-02-20 VITALS — BP 120/70 | HR 51 | Ht 68.0 in | Wt 174.8 lb

## 2014-02-20 DIAGNOSIS — R002 Palpitations: Secondary | ICD-10-CM

## 2014-02-20 DIAGNOSIS — I359 Nonrheumatic aortic valve disorder, unspecified: Secondary | ICD-10-CM | POA: Diagnosis not present

## 2014-02-20 DIAGNOSIS — E785 Hyperlipidemia, unspecified: Secondary | ICD-10-CM

## 2014-02-20 DIAGNOSIS — I2510 Atherosclerotic heart disease of native coronary artery without angina pectoris: Secondary | ICD-10-CM | POA: Diagnosis not present

## 2014-02-20 NOTE — Patient Instructions (Addendum)
Stay on your current medicines   Ok to stay off the Lipitor for a few more weeks - if no better - need to get back to Dr. Felipa Eth  See Dr. Burt Knack in 6 months  Call the [place] office at [phone] if you have any questions, problems or concerns.

## 2014-02-20 NOTE — Progress Notes (Signed)
Nathaniel [name] Date of Birth: 1945-09-23 Medical Record #[id number]  History of Present Illness: Dr. Ralene Ok is seen back today for a follow up visit. This is a one year check. Seen for Dr. Burt Knack. He is a retired Materials engineer. He is followed for microvascular angina, DVT, mild AI, and hyperlipidemia.   Last seen here a year ago - was doing ok. Echo updated and had Myoview for chronotropic incompetence as well as for his angina - this was low risk. Last cath was in 2008 - managed medically.   Comes back today. Here alone. Doing ok. Tells me he has had a rough summer. Earlier this year he called complaining of palpitations. 48 Holter ok - bradycardia with PACs/PVCs noted. No changes made. His palpitations then just went away. Have come back over the past few months - not as severe - does not want an event monitor. Got a rowing machine and was really working out hard - did ok. Actually having less angina - has more angina with walking. He feels like his angina may be better. Now with diffuse muscle aching. He is thinking about every possible etiology. Now more sedentary. Established with Dr. Felipa Eth for primary care. Just had his labs with Dr. Felipa Eth last week. Stopped his lipitor 2 weeks ago due to the myalgias.  He has seen no different. He is asking about repeat testing. Not dizzy or lightheaded. No passing out. Really on no cardiac medicines.    Current Outpatient Prescriptions  Medication Sig Dispense Refill  . cholecalciferol (VITAMIN D) 1000 UNITS tablet Take 1,000 Units by mouth every morning.       . Cyanocobalamin (VITAMIN B-12) 2500 MCG SUBL Place 1 tablet under the tongue every 7 (seven) days. Takes on Wednesday      . esomeprazole (NEXIUM) 40 MG capsule Take 40 mg by mouth at bedtime.      . ferrous sulfate 325 (65 FE) MG tablet Take 325 mg by mouth daily with breakfast.      . fish oil-omega-3 fatty acids 1000 MG capsule Take 1 g by mouth daily.        . pantoprazole (PROTONIX)  40 MG tablet Take 40 mg by mouth daily.      . Tamsulosin HCl (FLOMAX) 0.4 MG CAPS Take 0.4 mg by mouth at bedtime.       Marland Kitchen atorvastatin (LIPITOR) 20 MG tablet Take 1 tablet (20 mg total) by mouth daily.  90 tablet  3   No current facility-administered medications for this visit.    Allergies  Allergen Reactions  . Penicillins Anaphylaxis    Hands swell, throat swells  . Fentanyl Nausea And Vomiting    Past Medical History  Diagnosis Date  . GERD (gastroesophageal reflux disease)   . Pruritus   . History of rheumatic fever   . Increased prostate specific antigen (PSA) velocity   . Anemia   . Allergic rhinitis   . Palpitations   . Mitral valve prolapse   . Hiatal hernia     small  . Hyperlipidemia   . Coronary artery disease     nonobstructive    Past Surgical History  Procedure Laterality Date  . Cystoscopy    . Repair of an ac seperation of the left    . Tonsillectomy and adenoidectomy    . Esophagogastroduodenoscopy N/A 02/06/2013    Procedure: ESOPHAGOGASTRODUODENOSCOPY (EGD);  Surgeon: Cleotis Nipper, MD;  Location[facility] ENDOSCOPY;  Service: Endoscopy;  Laterality: N/A;    History  Smoking status  . Never Smoker   Smokeless tobacco  . Not on file    History  Alcohol Use  . Yes    Comment: occasional    No family history on file.  Review of Systems: The review of systems is per the HPI.  All other systems were reviewed and are negative.  Physical Exam: BP 120/70  Pulse 51  Ht 5\' 8"  (1.727 m)  Wt 174 lb 12.8 oz (79.289 kg)  BMI 26.58 kg/m2  SpO2 98% Patient is very pleasant and in no acute distress. Looks pretty fit. He is quite talkative. Little anxious. Skin is warm and dry. Color is normal.  HEENT is unremarkable. No bruits.  Normocephalic/atraumatic. PERRL. Sclera are nonicteric. Neck is supple. No masses. No JVD. Lungs are clear. Cardiac exam shows a regular rate and rhythm. No ectopy on my exam. Abdomen is soft. Extremities are without edema.  Gait and ROM are intact. No gross neurologic deficits noted.  Wt Readings from Last 3 Encounters:  02/20/14 174 lb 12.8 oz (79.289 kg)  03/05/13 175 lb (79.379 kg)  02/12/13 176 lb (79.833 kg)    LABORATORY DATA/PROCEDURES:  Labs from Dr. Felipa Eth - normal sed rate, negative [place], ANA. CBC normal. LFTs normal. Glucose 100, TSH normal. Iron studies normal. No lipids noted.   Lab Results  Component Value Date   WBC 4.5 02/12/2013   HGB 13.3 02/12/2013   HCT 39.5 02/12/2013   PLT 217.0 02/12/2013   GLUCOSE 89 12/25/2011   CHOL 124 05/15/2013   TRIG 77.0 05/15/2013   HDL 34.70* 05/15/2013   LDLCALC 74 05/15/2013   ALT 20 05/15/2013   AST 22 05/15/2013   NA 140 12/25/2011   K 4.2 12/25/2011   CL 103 12/25/2011   CREATININE 1.30 12/25/2011   BUN 18 12/25/2011   CO2 30 10/12/2011   TSH 1.84 07/07/2011   PSA 4.03* 07/07/2011   INR 1.1 04/05/2011    BNP (last 3 results) No results found for this basename: PROBNP,  in the last 8760 hours  Myoview Impression from September 2014  Exercise Capacity: Good exercise capacity.  BP Response: Normal blood pressure response.  Clinical Symptoms: Significant chest pain  ECG Impression: Lots of artifact during stress but no obvious horizontal ST segment depression  Comparison with Prior Nuclear Study: No images to compare  Overall Impression: Low risk stress nuclear study No ishcemia or infarct Significant chest pain during exercise.  LV Ejection Fraction: 58%. LV Wall Motion: NL LV Function; NL Wall Motion  [place]   Echo Study Conclusions from September 2014  - Left ventricle: The cavity size was normal. Wall thickness was increased in a pattern of mild LVH. Systolic function was normal. The estimated ejection fraction was in the range of 55% to 65%. Wall motion was normal; there were no regional wall motion abnormalities. Features are consistent with a pseudonormal left ventricular filling pattern, with concomitant abnormal relaxation  and increased filling pressure (grade 2 diastolic dysfunction). - Aortic valve: Mild regurgitation. - Mitral valve: Posterior leaflet prolapse Prolapse. Mild regurgitation. - Atrial septum: No defect or patent foramen ovale was identified. - Pulmonary arteries: PA peak pressure: 78mm Hg (S). - Pericardium, extracardiac: A trivial pericardial effusion was identified posterior to the heart.    CARDIAC CATH 2008 RESULTS: Hemodynamic data: The aortic pressure was 116/64 with a mean  of 86. The left ventricular pressure is 116/13.  Left main coronary main free of significant disease.  Left anterior descending  artery gave rise to a large diagonal branch  with two sub-branches and two septal perforators. There was mild 40%  narrowing in one of the sub branches of the diagonal branch. There was  30% narrowing in the proximal LAD. There was systolic bridging in the  proximal LAD just distal to the mild plaque which narrows the lumen to  about 40% during systole. The distal vessel is free of major  obstruction.  The circumflex artery gave rise to two marginal branches and a large and  small posterolateral branch. These vessels were free of significant  disease.  The right coronary artery was a moderately large vessel, gave rise to a  conus branch, two right ventricular branches, a posterior descending  branch, and two posterolateral branches. This vessel is free of  significant disease but there was TIMI-2 flow in the vessel. It took 4  cycles for the vessel to completely fill and it appeared that the  myocardial blush did not wash out rapidly.  The left ventriculography was done in the RAO projection and showed good  wall motion with no areas of hypokinesis. Estimated ejection fraction  was 55%..  CONCLUSION: Mild nonobstructive coronary artery disease with 30 cm  narrowing in the proximal left anterior descending, 70% systolic  compression in the proximal left anterior descending, and  TIMI-2 flow in  the right coronary with normal left ventricular function.  RECOMMENDATIONS: The patient has minimal nonobstructive disease and I  do not think the systolic bridging is responsible for his symptoms. I  think Timmothy Sours meets all the criteria for the diagnosis of microvascular  angina syndrome X. I will discuss further treatment with him. We may  reconsider with Renexa although he has had some reluctance to try this  in the past. Will also consider referral back to Dr. Jenne Pane at  [facility].  Bruce Alfonso Patten Olevia Perches, MD, [facility]  Electronically Signed  BRB/MEDQ D: 03/06/2007 T: 03/07/2007 Job: [id number]   Assessment / Plan: 1. Microvascular angina - seems to be stable. Still not interested in Ranexa.   2. Mild AI - echo from one year ago ok.   3. HLD - off statin - no recent lipids noted. His myalgias have not improved with being off of his statin - I doubt this is related.   4. Palpitations - does not want an event monitor. Did share with him about getting the APP on his Iphone for EKG - he will think about this.   He is asking about more testing done. He is subsequently seen with Dr. Burt Knack - his last echo and Myoview are reviewed again in detail. Would favor seeing him back in 6 months. Ok to stay off his Lipitor for a few more weeks and then if his myalgias are not improving - needs to get back to Dr. Felipa Eth - may need to see Rheumatology.   Patient is agreeable to this plan and will call if any problems develop in the interim.   Burtis Junes, RN, [place] [address] [place], [place]  [id number] [phone]

## 2014-02-25 DIAGNOSIS — M7710 Lateral epicondylitis, unspecified elbow: Secondary | ICD-10-CM | POA: Diagnosis not present

## 2014-02-26 DIAGNOSIS — N509 Disorder of male genital organs, unspecified: Secondary | ICD-10-CM | POA: Diagnosis not present

## 2014-02-26 DIAGNOSIS — M62838 Other muscle spasm: Secondary | ICD-10-CM | POA: Diagnosis not present

## 2014-02-26 DIAGNOSIS — R109 Unspecified abdominal pain: Secondary | ICD-10-CM | POA: Diagnosis not present

## 2014-02-26 DIAGNOSIS — R279 Unspecified lack of coordination: Secondary | ICD-10-CM | POA: Diagnosis not present

## 2014-03-17 ENCOUNTER — Other Ambulatory Visit: Payer: Self-pay | Admitting: Rheumatology

## 2014-03-17 DIAGNOSIS — M79609 Pain in unspecified limb: Secondary | ICD-10-CM | POA: Diagnosis not present

## 2014-03-17 DIAGNOSIS — M79606 Pain in leg, unspecified: Secondary | ICD-10-CM

## 2014-03-17 DIAGNOSIS — M545 Low back pain, unspecified: Secondary | ICD-10-CM

## 2014-03-17 DIAGNOSIS — M542 Cervicalgia: Secondary | ICD-10-CM | POA: Diagnosis not present

## 2014-03-17 DIAGNOSIS — M7711 Lateral epicondylitis, right elbow: Secondary | ICD-10-CM | POA: Diagnosis not present

## 2014-03-23 ENCOUNTER — Ambulatory Visit
Admission: RE | Admit: 2014-03-23 | Discharge: 2014-03-23 | Disposition: A | Discharge status: Home / Self Care | Payer: Medicare Other | Source: Ambulatory Visit | Attending: Rheumatology | Admitting: Rheumatology

## 2014-03-23 DIAGNOSIS — R102 Pelvic and perineal pain: Secondary | ICD-10-CM | POA: Diagnosis not present

## 2014-03-23 DIAGNOSIS — M79606 Pain in leg, unspecified: Secondary | ICD-10-CM

## 2014-03-23 DIAGNOSIS — E780 Pure hypercholesterolemia: Secondary | ICD-10-CM | POA: Diagnosis not present

## 2014-03-23 DIAGNOSIS — M545 Low back pain, unspecified: Secondary | ICD-10-CM

## 2014-03-23 DIAGNOSIS — M7711 Lateral epicondylitis, right elbow: Secondary | ICD-10-CM | POA: Diagnosis not present

## 2014-03-23 DIAGNOSIS — Z1389 Encounter for screening for other disorder: Secondary | ICD-10-CM | POA: Diagnosis not present

## 2014-03-23 DIAGNOSIS — M47816 Spondylosis without myelopathy or radiculopathy, lumbar region: Secondary | ICD-10-CM | POA: Diagnosis not present

## 2014-03-23 DIAGNOSIS — Z23 Encounter for immunization: Secondary | ICD-10-CM | POA: Diagnosis not present

## 2014-03-23 DIAGNOSIS — M5137 Other intervertebral disc degeneration, lumbosacral region: Secondary | ICD-10-CM | POA: Diagnosis not present

## 2014-03-25 DIAGNOSIS — R278 Other lack of coordination: Secondary | ICD-10-CM | POA: Diagnosis not present

## 2014-03-25 DIAGNOSIS — R102 Pelvic and perineal pain: Secondary | ICD-10-CM | POA: Diagnosis not present

## 2014-03-25 DIAGNOSIS — M62838 Other muscle spasm: Secondary | ICD-10-CM | POA: Diagnosis not present

## 2014-03-25 DIAGNOSIS — N508 Other specified disorders of male genital organs: Secondary | ICD-10-CM | POA: Diagnosis not present

## 2014-04-06 DIAGNOSIS — M7711 Lateral epicondylitis, right elbow: Secondary | ICD-10-CM | POA: Diagnosis not present

## 2014-04-08 ENCOUNTER — Other Ambulatory Visit: Payer: Self-pay | Admitting: Orthopedic Surgery

## 2014-04-08 DIAGNOSIS — M7711 Lateral epicondylitis, right elbow: Secondary | ICD-10-CM

## 2014-04-15 ENCOUNTER — Other Ambulatory Visit: Payer: Medicare Other

## 2014-04-17 ENCOUNTER — Other Ambulatory Visit: Payer: Medicare Other

## 2014-04-20 DIAGNOSIS — G8929 Other chronic pain: Secondary | ICD-10-CM | POA: Diagnosis not present

## 2014-04-20 DIAGNOSIS — R972 Elevated prostate specific antigen [PSA]: Secondary | ICD-10-CM | POA: Diagnosis not present

## 2014-04-20 DIAGNOSIS — N400 Enlarged prostate without lower urinary tract symptoms: Secondary | ICD-10-CM | POA: Diagnosis not present

## 2014-04-20 DIAGNOSIS — R102 Pelvic and perineal pain: Secondary | ICD-10-CM | POA: Diagnosis not present

## 2014-04-20 DIAGNOSIS — N411 Chronic prostatitis: Secondary | ICD-10-CM | POA: Diagnosis not present

## 2014-04-21 ENCOUNTER — Ambulatory Visit (INDEPENDENT_AMBULATORY_CARE_PROVIDER_SITE_OTHER): Payer: Medicare Other | Admitting: Sports Medicine

## 2014-04-21 ENCOUNTER — Encounter: Payer: Self-pay | Admitting: Sports Medicine

## 2014-04-21 VITALS — BP 111/72 | HR 54 | Ht 68.0 in | Wt 173.0 lb

## 2014-04-21 DIAGNOSIS — M7711 Lateral epicondylitis, right elbow: Secondary | ICD-10-CM | POA: Insufficient documentation

## 2014-04-21 DIAGNOSIS — I2510 Atherosclerotic heart disease of native coronary artery without angina pectoris: Secondary | ICD-10-CM

## 2014-04-21 MED ORDER — NITROGLYCERIN 0.2 MG/HR TD PT24: MEDICATED_PATCH | TRANSDERMAL | Status: DC

## 2014-04-21 NOTE — Progress Notes (Signed)
Patient ID: Nathaniel [name], male   DOB: 03-15-1946, 68 y.o.   MRN: [id number] CC: elbow pain HPI:  59yoM presents with Right elbow pain for the last 5 months. He was rowing regularly starting 8 months ago. He was cutting hedges with hedge cutters 5 months ago and about that time started having severe pain R lateral elbow. He started having myalgias everywhere, worst in distal legs, both equally. Does not bother him when he is sleeping. He stopped taking his lipitor to see if it would help. Pain in elbow is still at worst 7-8/62m right now about a 4.  He has tried physical therapy for the elbow for 2 months with no benefit in pain.  He has also tried stretching, elbow bands, wrist splints during the day. Elbow was injected with lidocaine alone which helped pain for 24h then worsened pain.  He was seen by Dr Daylene Katayama and then DR Supple.  Dr Charlestine Night has done a CVD workup and note of labs are that remarkable.  ESR was only 1.  Retired Materials engineer.  OBJECTIVE: BP 111/72 mmHg  Pulse 54  Ht 5' 8" (1.727 m)  Wt 173 lb (78.472 kg)  BMI 26.31 kg/m2 PE: Gen: alert, in NAD MSK/ NEURO: pain with R 3rd finger extension against resistance, pain with R wrist extension. Slight pain with book test R side, able to hold book fairly well with R arm outstretched. Sensation intact.  Ultrasound: avulsion fragment and calcific spur R lateral epicondyle seen. Increased microvasculature R common extensor tendon of the elbow with multiple micro tears in the tendon noted with hypoechoic change.  ASSESSMENT / PLAN:  severe tennis elbow - compression sleeve R elbow - exercises with wrist extension and supraspinatus strengthening - come back in 1 month for repeat ultrasound

## 2014-04-21 NOTE — Assessment & Plan Note (Signed)
HEP  Trial with compression sleeve  Topical NTG  Repeat US in 4 weeks  If he gets headaches on NTG (has in past) we will consider topicals

## 2014-04-21 NOTE — Patient Instructions (Signed)

## 2014-04-27 ENCOUNTER — Other Ambulatory Visit: Payer: Medicare Other

## 2014-05-20 ENCOUNTER — Encounter: Payer: Self-pay | Admitting: Sports Medicine

## 2014-05-20 ENCOUNTER — Ambulatory Visit (INDEPENDENT_AMBULATORY_CARE_PROVIDER_SITE_OTHER): Payer: Medicare Other | Admitting: Sports Medicine

## 2014-05-20 VITALS — BP 116/61 | Ht 68.0 in | Wt 173.0 lb

## 2014-05-20 DIAGNOSIS — M609 Myositis, unspecified: Secondary | ICD-10-CM

## 2014-05-20 DIAGNOSIS — M791 Myalgia: Secondary | ICD-10-CM

## 2014-05-20 DIAGNOSIS — I2510 Atherosclerotic heart disease of native coronary artery without angina pectoris: Secondary | ICD-10-CM | POA: Diagnosis not present

## 2014-05-20 DIAGNOSIS — M7711 Lateral epicondylitis, right elbow: Secondary | ICD-10-CM

## 2014-05-20 DIAGNOSIS — M2550 Pain in unspecified joint: Secondary | ICD-10-CM | POA: Diagnosis not present

## 2014-05-20 DIAGNOSIS — IMO0001 Reserved for inherently not codable concepts without codable children: Secondary | ICD-10-CM

## 2014-05-20 NOTE — Progress Notes (Signed)
Patient ID: Nathaniel [name], male   DOB: 04-Mar-1946, 68 y.o.   MRN: [id number]  Subjective:  Patient presents for follow-up on right tennis elbow.  He states he has been wearing compression sleeve and doing stretching exercises at home.  He has not done any strengthening exercises.  He tried using Nitro patch but used the entire patch and developed an excruciating headache so he only used the patch this one time.  Overall his pain has improved slightly and [place] stationary [place] machine with only minor discomfort.  His lateral elbow pain is mostly intermittent with good and bad days.  His pain is localized to focal point over lateral epicondyle.    He continue to have intermittent myalgias/arthralgias in lower legs and arms.  He had previous workup including ESR, ANA, CBC, CPK, iron studies, TSH.  His CPK level was ~190 and other labs were unremarkable.  He reports his testosterone was borderline low in the past.  He stopped statin 3 months ago with no improvement.  He also is fatigued.  He did discuss possibility of a prednisone course with another provider.  He is on Vit D replacement chronically.  Objective: BP 116/61 mmHg  Ht '5\' 8"'  (1.727 m)  Wt 173 lb (78.472 kg)  BMI 26.31 kg/m2 General: calm, cooperative, NAD HEENT: conj clear, sclera anicteric, Lambert/AT Respiratory: breathing non-labored Cardiovascular: distal pulses intact bilateral upper extremities MSK:  Right Elbow  Focal tenderness over lateral epicondyle; no tendon/muscle tenderness  Normal flexion/extension at elbow and wrist  Right upper extremity strength normal  Lateral epicondyle pain with resisted wrist extension/supination  Reproducible pain with hand grip Note today he passed book test on full elbow extension  Ultrasound: slight improvement in appearance of muscle/tendon compared to previous ultrasound; calcific deposits presents near lateral epicondyle and decrease in  neovascularization present on prior ultrasound; ski jump sign present   Assessment/Plan:  Patient is a 68 y/o with lateral epicondylitis of right elbow and persistent myalgias.  1.  Right Lateral Epicondylitis 2.  Right Elbow Pain 3.  Myalgias  He appears to be improving slowly in regards to his lateral epicondylitis.  He should continue stretching but also needs to add in strengthening exercises which we reviewed today.  He can continue rowing as long as this remains comfortable.  He can try 1/4 patch of Nitro to try and facilitate healing.  Continue compression sleeve.  Stop NTG if headaches  #3   Will check CRP, RF, and CPK in regards to his myalgias.  Consider rechecking testosterone as this could cause his symptoms and he was previously low.  Other test reck at 6 mos  Patient seen and discussed with Dr. Oneida Alar.  Creig Hines PGY-3 Family Medicine  Reviewed and agreee/  Stefanie Libel, MD

## 2014-05-20 NOTE — Assessment & Plan Note (Signed)
ON [place] this has improved a moderate amount  He has also been able to increase activity  Cont on HEP Cont compression  Not sure if he can tolerate NTG

## 2014-05-21 ENCOUNTER — Ambulatory Visit: Payer: Medicare Other | Admitting: Sports Medicine

## 2014-05-21 LAB — RHEUMATOID FACTOR: Rhuematoid fact SerPl-aCnc: <10 IU/mL (ref <=14)

## 2014-05-21 LAB — C-REACTIVE PROTEIN

## 2014-05-21 LAB — CK: Total CK: 144 U/L (ref 7–232)

## 2014-06-02 ENCOUNTER — Telehealth: Payer: Self-pay | Admitting: Cardiovascular Disease

## 2014-06-02 DIAGNOSIS — E785 Hyperlipidemia, unspecified: Secondary | ICD-10-CM

## 2014-06-02 MED ORDER — ATORVASTATIN CALCIUM 20 MG PO TABS: 20.0000 mg | ORAL_TABLET | Freq: Every day | ORAL | Status: DC

## 2014-06-02 NOTE — Telephone Encounter (Signed)
Prescription sent to the pharmacy. Pt aware.

## 2014-06-02 NOTE — Telephone Encounter (Signed)
New Msg     Pt calling requesting to have a new prescription for Atorvastatin 20 mg with refills.   Pt would like this sent to [facility] [place] [address].

## 2014-06-04 DIAGNOSIS — J40 Bronchitis, not specified as acute or chronic: Secondary | ICD-10-CM | POA: Diagnosis not present

## 2014-06-04 DIAGNOSIS — H6120 Impacted cerumen, unspecified ear: Secondary | ICD-10-CM | POA: Diagnosis not present

## 2014-06-04 DIAGNOSIS — R05 Cough: Secondary | ICD-10-CM | POA: Diagnosis not present

## 2014-06-04 DIAGNOSIS — M5431 Sciatica, right side: Secondary | ICD-10-CM | POA: Diagnosis not present

## 2014-06-23 DIAGNOSIS — R200 Anesthesia of skin: Secondary | ICD-10-CM | POA: Diagnosis not present

## 2014-06-30 DIAGNOSIS — H903 Sensorineural hearing loss, bilateral: Secondary | ICD-10-CM | POA: Diagnosis not present

## 2014-06-30 DIAGNOSIS — J40 Bronchitis, not specified as acute or chronic: Secondary | ICD-10-CM | POA: Diagnosis not present

## 2014-06-30 DIAGNOSIS — H9201 Otalgia, right ear: Secondary | ICD-10-CM | POA: Diagnosis not present

## 2014-06-30 DIAGNOSIS — H6123 Impacted cerumen, bilateral: Secondary | ICD-10-CM | POA: Diagnosis not present

## 2014-07-21 ENCOUNTER — Encounter: Payer: Self-pay | Admitting: Sports Medicine

## 2014-07-21 ENCOUNTER — Ambulatory Visit (INDEPENDENT_AMBULATORY_CARE_PROVIDER_SITE_OTHER): Payer: Medicare Other | Admitting: Sports Medicine

## 2014-07-21 VITALS — BP 137/91 | Ht 68.0 in | Wt 173.0 lb

## 2014-07-21 DIAGNOSIS — M7711 Lateral epicondylitis, right elbow: Secondary | ICD-10-CM

## 2014-07-21 NOTE — Assessment & Plan Note (Signed)
Clinically improved with less pain and improvement in the US showing less neovasculization   Recommend: Continue strengthening with eccentric exercises Try NTG protocol again at 1/4 patch F/U in 3 months to re-evaluate

## 2014-07-21 NOTE — Progress Notes (Signed)
  Nathaniel [name] - 69 y.o. male MRN [id number]  Date of birth: 1945/09/22  SUBJECTIVE:  Including CC & ROS.  Patient is a 69 yo pleasant male present for f/u right lateral epicondylitis  Patient report improvement in pain but still tenderness and pain with wrist flexion.  He has been working on stretching but not preforming eccentric exercises.  He attempted nitro protocol but develop a severe headache after using the entire patch He has been using a compression sleeve.  Overall his pain has improved and he has been able to continue rowing for exercise  His intermittent myalgias/arthralgias has improved. He only has some intermittent calf aching at rest, no pain with  Walking, or rowing. Previous lab work has been negative. He does report know lumbar degenerative changes but no stenosis.    ROS: Review of systems otherwise negative except for information present in HPI  HISTORY: Past Medical, Surgical, Social, and Family History Reviewed & Updated per EMR. Pertinent Historical Findings include: HLD, Mitral valve prolapse, CAD,   DATA REVIEWED: No imaging available   PHYSICAL EXAM:  VS: BP:(!) 137/91 mmHg  HR: bpm  TEMP: ( )  RESP:   HT:5\' 8"  (172.7 cm)   WT:173 lb (78.472 kg)  BMI:26.4 ELBOW EXAM: General: well nourished Skin of UE: warm; dry, no rashes, lesions, ecchymosis or erythema. Vascular: radial pulses 2+ bilaterally Neurologically: Sensation to light touch upper extremities equal and intact bilaterally.     Observation: no elbow edema, no swelling, no erythema, no bruising  Palpation: tenderness over lateral epicondyle    ROM: normal supination and pronation, elbow extension, full flexion    Strength: pain and 4/5 strength of wrist flexion elbow is Normal 5/5 strength with extension/ flexion, pronation and supination.           Special test: stable medial and lateral collateral ligaments, yes tenderness with finger extension   MSK [place]: Repeat scan show continue edema  and hyperechoic change within the common extensor tendon. The gradually progression over the past 3 months has showed less neovascularizing in the muscle tissue.   ASSESSMENT & PLAN: See problem based charting & AVS for pt instructions.

## 2014-08-19 ENCOUNTER — Encounter: Payer: Self-pay | Admitting: Cardiovascular Disease

## 2014-08-19 ENCOUNTER — Ambulatory Visit (INDEPENDENT_AMBULATORY_CARE_PROVIDER_SITE_OTHER): Payer: Medicare Other | Admitting: Cardiovascular Disease

## 2014-08-19 VITALS — BP 112/70 | HR 45 | Ht 68.0 in | Wt 178.8 lb

## 2014-08-19 DIAGNOSIS — I25119 Atherosclerotic heart disease of native coronary artery with unspecified angina pectoris: Secondary | ICD-10-CM

## 2014-08-19 DIAGNOSIS — R0609 Other forms of dyspnea: Secondary | ICD-10-CM | POA: Diagnosis not present

## 2014-08-19 NOTE — Patient Instructions (Signed)
Your physician has recommended that you have a cardiopulmonary stress test (CPX). CPX testing is a non-invasive measurement of heart and lung function. It replaces a traditional treadmill stress test. This type of test provides a tremendous amount of information that relates not only to your present condition but also for future outcomes. This test combines measurements of you ventilation, respiratory gas exchange in the lungs, electrocardiogram (EKG), blood pressure and physical response before, during, and following an exercise protocol.  Your physician wants you to follow-up in: 1 YEAR with Dr Burt Knack.  You will receive a reminder letter in the mail two months in advance. If you don't receive a letter, please call our office to schedule the follow-up appointment.  Your physician recommends that you continue on your current medications as directed. Please refer to the Current Medication list given to you today.

## 2014-08-21 ENCOUNTER — Encounter: Payer: Self-pay | Admitting: Cardiovascular Disease

## 2014-08-21 NOTE — Progress Notes (Signed)
Cardiology Office Note   Date:  08/21/2014   ID:  Nathaniel [name], DOB [birth year], MRN [id number]  PCP:  Mathews Argyle, MD  Cardiologist:  Sherren Mocha, MD    Chief Complaint  Patient presents with  . Shortness of Breath     History of Present Illness: Nathaniel [name] is a 69 y.o. male who presents for follow-up of microvascular angina, history of DVT/PE, and hyperlipidemia. The patient reports no recent change in exertional chest pressure. He has been able to per dissipate in all of his Nathaniel activities without any limitation from chest discomfort. He has not required nitroglycerin. He has some chest pressure and cold temperatures were with the early part of his exercise. His main complaint today is exertional dyspnea. This has slowly progressed over the last 6 months. He denies orthopnea, PND, or leg swelling. He also reports generalized fatigue and exercise intolerance. He feels like his muscles fatigue quickly and take a long time to recover. He denies lightheadedness or syncope.   Past Medical History  Diagnosis Date  . GERD (gastroesophageal reflux disease)   . Pruritus   . History of rheumatic fever   . Increased prostate specific antigen (PSA) velocity   . Anemia   . Allergic rhinitis   . Palpitations   . Mitral valve prolapse   . Hiatal hernia     small  . Hyperlipidemia   . Coronary artery disease     nonobstructive    Past Surgical History  Procedure Laterality Date  . Cystoscopy    . Repair of an ac seperation of the left    . Tonsillectomy and adenoidectomy    . Esophagogastroduodenoscopy N/A 02/06/2013    Procedure: ESOPHAGOGASTRODUODENOSCOPY (EGD);  Surgeon: Cleotis Nipper, MD;  Location: [facility] ENDOSCOPY;  Service: Endoscopy;  Laterality: N/A;    Current Outpatient Prescriptions  Medication Sig Dispense Refill  . atorvastatin (LIPITOR) 20 MG tablet Take 1 tablet (20 mg total) by mouth daily. 90 tablet 3  . cholecalciferol (VITAMIN D) 1000  UNITS tablet Take 1,000 Units by mouth every morning.     . Cyanocobalamin (VITAMIN B-12) 2500 MCG SUBL Place 1 tablet under the tongue every 7 (seven) days. Takes on Wednesday    . esomeprazole (NEXIUM) 40 MG capsule Take 40 mg by mouth at bedtime.    . ferrous sulfate 325 (65 FE) MG tablet Take 325 mg by mouth daily with breakfast.    . fish oil-omega-3 fatty acids 1000 MG capsule Take 1 g by mouth daily.      . pantoprazole (PROTONIX) 40 MG tablet Take 40 mg by mouth daily.    . Tamsulosin HCl (FLOMAX) 0.4 MG CAPS Take 0.4 mg by mouth at bedtime.      No current facility-administered medications for this visit.    Allergies:   Penicillins; Fentanyl; and Imdur   Social History:  The patient  reports that he has never smoked. He does not have any smokeless tobacco history on file. He reports that he drinks alcohol. He reports that he does not use illicit drugs.   Family History:  The patient's  family history is not on file.    ROS:  Please see the history of present illness.  Otherwise, review of systems is positive for chest pressure, palpitations, exertional dyspnea, back pain, muscle pain.  All other systems are reviewed and negative.    PHYSICAL EXAM: VS:  BP 112/70 mmHg  Pulse 45  Ht 5\' 8"  (1.727 m)  Wt 178 lb 12.8 oz (81.103 kg)  BMI 27.19 kg/m2 , BMI Body mass index is 27.19 kg/(m^2). GEN: Well nourished, well developed, in no acute distress HEENT: Nathaniel Neck: no JVD, no masses. No carotid bruits Cardiac: Bradycardic and regular without murmur or gallop                Respiratory:  clear to auscultation bilaterally, Nathaniel work of breathing GI: soft, nontender, nondistended, + BS MS: no deformity or atrophy Ext: no pretibial edema, pedal pulses 2+= bilaterally Skin: warm and dry, no rash Neuro:  Strength and sensation are intact Psych: euthymic mood, full affect  EKG:  EKG is ordered today. The ekg ordered today shows sinus bradycardia 45 bpm, otherwise within Nathaniel  limits.  Recent Labs: No results found for requested labs within last 365 days.   Lipid Panel     Component Value Date/Time   CHOL 124 05/15/2013 0926   TRIG 77.0 05/15/2013 0926   HDL 34.70* 05/15/2013 0926   CHOLHDL 4 05/15/2013 0926   VLDL 15.4 05/15/2013 0926   LDLCALC 74 05/15/2013 0926      Wt Readings from Last 3 Encounters:  08/19/14 178 lb 12.8 oz (81.103 kg)  07/21/14 173 lb (78.472 kg)  05/20/14 173 lb (78.472 kg)     Cardiac Studies Reviewed: 2D Echo 9.9.2014: Study Conclusions  - Left ventricle: The cavity size was Nathaniel. Wall thickness was increased in a pattern of mild LVH. Systolic function was Nathaniel. The estimated ejection fraction was in the range of 55% to 65%. Wall motion was Nathaniel; there were no regional wall motion abnormalities. Features are consistent with a pseudonormal left ventricular filling pattern, with concomitant abnormal relaxation and increased filling pressure (grade 2 diastolic dysfunction). - Aortic valve: Mild regurgitation. - Mitral valve: Posterior leaflet prolapse Prolapse. Mild regurgitation. - Atrial septum: No defect or patent foramen ovale was identified. - Pulmonary arteries: PA peak pressure: 19mm Hg (S). - Pericardium, extracardiac: A trivial pericardial effusion was identified posterior to the heart.  Nuclear stress scan 03/05/2013: Impression Exercise Capacity: Good exercise capacity. BP Response: Nathaniel blood pressure response. Clinical Symptoms: Significant chest pain ECG Impression: Lots of artifact during stress but no obvious horizontal ST segment depression Comparison with Prior Nuclear Study: No images to compare  Overall Impression: Low risk stress nuclear study No ishcemia or infarct Significant chest pain during exercise.  LV Ejection Fraction: 58%. LV Wall Motion: NL LV Function; NL Wall Motion  ASSESSMENT AND PLAN: 1.  Coronary artery disease, native vessel: The  patient has nonobstructive CAD with a long history of microvascular angina on exertion. Overall he is quite stable. I did not recommend any medication changes today.  2. Exercise intolerance/exertional dyspnea: Possible etiologies reviewed with patient. He does have resting bradycardia. However, he describes a generalized fatigue and even "muscle fatigue" with exercise. I think a cardiopulmonary exercise test would provide good information about chronotropic incompetence, potential cardiac limitation, or pulmonary limitation. This will be arranged and I will review the findings with Dr. Haroldine Laws once it is completed.  3. History of DVT/PE: The patient plans an overseas flight and we discussed the pros and cons of prophylaxis. Literature reviewed and there are no clear recommendations other than compression stockings, frequent ambulation, and avoidance of dehydration. However, in high risk patients it is not unreasonable to consider pharmacoprophylaxis. Will consider a single dose of Lovenox prior to travel. Will contact the patient to discuss further.  4. Hyperlipidemia: He is treated with atorvastatin.  Labs followed by his primary physician.  Current medicines are reviewed with the patient today.  The patient does not have concerns regarding medicines.  The following changes have been made:  no change  Labs/ tests ordered today include:   Orders Placed This Encounter  Procedures  . Cardiopulmonary exercise test  . EKG 12-Lead    Disposition:   FU 1 Year.  Signed, Sherren Mocha, MD  08/21/2014 5:19 PM    [address], [place], [place]  [address] Phone: [phone]; Fax: [phone]

## 2014-09-14 ENCOUNTER — Ambulatory Visit (HOSPITAL_COMMUNITY): Payer: Medicare Other | Attending: Cardiovascular Disease

## 2014-09-14 DIAGNOSIS — R0609 Other forms of dyspnea: Secondary | ICD-10-CM

## 2014-09-14 DIAGNOSIS — R0600 Dyspnea, unspecified: Secondary | ICD-10-CM | POA: Diagnosis not present

## 2014-09-14 DIAGNOSIS — I25119 Atherosclerotic heart disease of native coronary artery with unspecified angina pectoris: Secondary | ICD-10-CM | POA: Insufficient documentation

## 2014-09-21 DIAGNOSIS — Z79899 Other long term (current) drug therapy: Secondary | ICD-10-CM | POA: Diagnosis not present

## 2014-09-21 DIAGNOSIS — M7711 Lateral epicondylitis, right elbow: Secondary | ICD-10-CM | POA: Diagnosis not present

## 2014-09-21 DIAGNOSIS — Z23 Encounter for immunization: Secondary | ICD-10-CM | POA: Diagnosis not present

## 2014-09-21 DIAGNOSIS — R001 Bradycardia, unspecified: Secondary | ICD-10-CM | POA: Diagnosis not present

## 2014-09-21 DIAGNOSIS — Z1389 Encounter for screening for other disorder: Secondary | ICD-10-CM | POA: Diagnosis not present

## 2014-09-21 DIAGNOSIS — I2510 Atherosclerotic heart disease of native coronary artery without angina pectoris: Secondary | ICD-10-CM | POA: Diagnosis not present

## 2014-09-21 DIAGNOSIS — E780 Pure hypercholesterolemia: Secondary | ICD-10-CM | POA: Diagnosis not present

## 2014-09-21 DIAGNOSIS — Z0000 Encounter for general adult medical examination without abnormal findings: Secondary | ICD-10-CM | POA: Diagnosis not present

## 2014-09-22 ENCOUNTER — Encounter: Payer: Self-pay | Admitting: Sports Medicine

## 2014-09-22 ENCOUNTER — Ambulatory Visit (INDEPENDENT_AMBULATORY_CARE_PROVIDER_SITE_OTHER): Payer: Medicare Other | Admitting: Sports Medicine

## 2014-09-22 VITALS — BP 119/81 | HR 62 | Ht 68.0 in | Wt 178.0 lb

## 2014-09-22 DIAGNOSIS — M7711 Lateral epicondylitis, right elbow: Secondary | ICD-10-CM | POA: Diagnosis present

## 2014-09-22 DIAGNOSIS — I25119 Atherosclerotic heart disease of native coronary artery with unspecified angina pectoris: Secondary | ICD-10-CM | POA: Diagnosis not present

## 2014-09-22 DIAGNOSIS — M2022 Hallux rigidus, left foot: Secondary | ICD-10-CM

## 2014-09-22 NOTE — Patient Instructions (Signed)
Obtain Arnica gel over the counter and apply 3 times a day on affected area

## 2014-09-23 ENCOUNTER — Encounter: Payer: Self-pay | Admitting: Sports Medicine

## 2014-09-23 NOTE — Progress Notes (Signed)
  Nathaniel [name] - 69 y.o. male MRN [id number]  Date of birth: 23-Aug-1945  SUBJECTIVE:  Including CC & ROS.  Patient is a 69 yo pleasant male present for f/u right lateral epicondylitis  Patient report improvement in pain but still tenderness and pain with wrist flexion.  He has been working on stretching but not preforming eccentric exercises.  He previously attempted nitroglycerin protocol is using entire patch and had a severe headache. Patient was advised that his last visit to decrease his patch to order patch but he did not attempt this treatment. He has been using a compression sleeve.  Overall his pain has improved and he has been able to continue rowing for exercise His intermittent myalgias/arthralgias has improved.  Patient also has a new complaint of 2 weeks of left great toe pain sometimes intermittently with walking he gets pain at MTP joint. Describes it as sharp in nature worse with toeing off. This is not affecting his gait. No numbness and tingling associated. No swelling erythema associated.  ROS: Review of systems otherwise negative except for information present in HPI  HISTORY: Past Medical, Surgical, Social, and Family History Reviewed & Updated per EMR. Pertinent Historical Findings include: HLD, Mitral valve prolapse, CAD,   DATA REVIEWED: No imaging available   PHYSICAL EXAM:  VS: BP:119/81 mmHg  HR:62bpm  TEMP: ( )  RESP:   HT:5\' 8"  (172.7 cm)   WT:178 lb (80.74 kg)  BMI:27.1 Right ELBOW EXAM: General: well nourished Skin of UE: warm; dry, no rashes, lesions, ecchymosis or erythema. Vascular: radial pulses 2+ bilaterally Neurologically: Sensation to light touch upper extremities equal and intact bilaterally.     Observation: no elbow edema, no swelling, no erythema, no bruising  Palpation: tenderness over lateral epicondyle    ROM: normal supination and pronation, elbow extension, full flexion    Strength: pain and 5/5 strength of wrist flexion elbow,  4+/5 strength with extension and pain with supination.       Special test: stable medial and lateral collateral ligaments, yes tenderness with finger extension  Left great toe has increased rigidity and tenderness over the joint space at the MTP joint.  MSK [place]: Repeat scan show continue resolution of edema and hyperechoic change within the common extensor tendon, significantly less  neovascularizing in the muscle tissue. Calcific changes within the tendon consistent with gradual healing. Evaluation of the MTP joint shows bone spurring at the proximal phalanx no signs of fracture.  ASSESSMENT & PLAN: See problem based charting & AVS for pt instructions. Impression: 1. Right elbow lateral epicondylitis 2. Left great toe degenerative arthritis with bone spurring.  Recommendations: - For the lateral epicondylitis recommend continuing elbow sleeve, he can try an over-the-counter topical gel called Arnica for additional symptomatic relief. He should continue rowing exercises to strengthen. -For his great toe DJD and bone spurring provided patient with first ray post for additional cushioning and support. He can also apply the Arnica gel to this issue as well. Follow-up as needed.

## 2014-09-25 DIAGNOSIS — Z79899 Other long term (current) drug therapy: Secondary | ICD-10-CM | POA: Diagnosis not present

## 2014-09-25 DIAGNOSIS — E780 Pure hypercholesterolemia: Secondary | ICD-10-CM | POA: Diagnosis not present

## 2014-10-09 ENCOUNTER — Telehealth: Payer: Self-pay | Admitting: Cardiovascular Disease

## 2014-10-09 DIAGNOSIS — I2699 Other pulmonary embolism without acute cor pulmonale: Secondary | ICD-10-CM

## 2014-10-09 MED ORDER — ENOXAPARIN SODIUM 80 MG/0.8ML ~~LOC~~ SOLN: SUBCUTANEOUS | Status: DC

## 2014-10-09 NOTE — Telephone Encounter (Signed)
New message     Pt says he needs some lovenox.  He is going on a long airplane trip. He does not want to get blood clots.

## 2014-10-09 NOTE — Telephone Encounter (Signed)
Follow up ° ° ° ° ° °Returning Nathaniel [name]'s call °

## 2014-10-09 NOTE — Telephone Encounter (Signed)
Left message on machine for pt to contact the office.   The pt's dose of lovenox will be 80mg . Per [facility] the pt should take a dosage on the days that he will be flying.

## 2014-10-09 NOTE — Telephone Encounter (Signed)
I spoke with the pt and he will only be flying to and from for his trip. Per the pt he will only need 2 dosages of lovenox. I will send Rx into the pt's pharmacy. The pt is aware to take dosage of lovenox on the days that he will be flying.

## 2014-10-09 NOTE — Telephone Encounter (Signed)
Ok to write lovenox at 1 mg/kg subcutaneous every 12 hours for travel. Take as directed. Would write 7 day Rx if that seems reasonable to Dr Ralene Ok depending on how much he will be flying. thx  Sherren Mocha 10/09/2014 11:13 AM

## 2014-11-16 DIAGNOSIS — R972 Elevated prostate specific antigen [PSA]: Secondary | ICD-10-CM | POA: Diagnosis not present

## 2014-12-07 ENCOUNTER — Other Ambulatory Visit: Payer: Self-pay

## 2014-12-30 DIAGNOSIS — R972 Elevated prostate specific antigen [PSA]: Secondary | ICD-10-CM | POA: Diagnosis not present

## 2015-01-05 DIAGNOSIS — Z1211 Encounter for screening for malignant neoplasm of colon: Secondary | ICD-10-CM | POA: Diagnosis not present

## 2015-01-06 DIAGNOSIS — K219 Gastro-esophageal reflux disease without esophagitis: Secondary | ICD-10-CM | POA: Diagnosis not present

## 2015-01-06 DIAGNOSIS — R194 Change in bowel habit: Secondary | ICD-10-CM | POA: Diagnosis not present

## 2015-01-20 DIAGNOSIS — H2513 Age-related nuclear cataract, bilateral: Secondary | ICD-10-CM | POA: Diagnosis not present

## 2015-02-08 DIAGNOSIS — Z85828 Personal history of other malignant neoplasm of skin: Secondary | ICD-10-CM | POA: Diagnosis not present

## 2015-02-08 DIAGNOSIS — D2372 Other benign neoplasm of skin of left lower limb, including hip: Secondary | ICD-10-CM | POA: Diagnosis not present

## 2015-02-08 DIAGNOSIS — L821 Other seborrheic keratosis: Secondary | ICD-10-CM | POA: Diagnosis not present

## 2015-02-08 DIAGNOSIS — C44519 Basal cell carcinoma of skin of other part of trunk: Secondary | ICD-10-CM | POA: Diagnosis not present

## 2015-02-08 DIAGNOSIS — D485 Neoplasm of uncertain behavior of skin: Secondary | ICD-10-CM | POA: Diagnosis not present

## 2015-02-22 ENCOUNTER — Encounter (HOSPITAL_COMMUNITY): Payer: Self-pay | Admitting: *Deleted

## 2015-02-24 ENCOUNTER — Encounter (HOSPITAL_COMMUNITY): Payer: Self-pay | Admitting: Certified Registered"

## 2015-02-24 ENCOUNTER — Ambulatory Visit (HOSPITAL_COMMUNITY)
Admission: RE | Admit: 2015-02-24 | Discharge: 2015-02-24 | Disposition: A | Discharge status: Home / Self Care | Payer: Medicare Other | Source: Ambulatory Visit | Attending: Gastroenterology | Admitting: Gastroenterology

## 2015-02-24 ENCOUNTER — Encounter (HOSPITAL_COMMUNITY)
Admission: RE | Disposition: A | Discharge status: Home / Self Care | Payer: Self-pay | Source: Ambulatory Visit | Attending: Gastroenterology

## 2015-02-24 DIAGNOSIS — K219 Gastro-esophageal reflux disease without esophagitis: Secondary | ICD-10-CM | POA: Insufficient documentation

## 2015-02-24 DIAGNOSIS — Z800 Family history of malignant neoplasm of digestive organs: Secondary | ICD-10-CM | POA: Insufficient documentation

## 2015-02-24 DIAGNOSIS — E785 Hyperlipidemia, unspecified: Secondary | ICD-10-CM | POA: Diagnosis not present

## 2015-02-24 DIAGNOSIS — I2510 Atherosclerotic heart disease of native coronary artery without angina pectoris: Secondary | ICD-10-CM | POA: Insufficient documentation

## 2015-02-24 DIAGNOSIS — K921 Melena: Secondary | ICD-10-CM | POA: Diagnosis not present

## 2015-02-24 HISTORY — DX: Unspecified osteoarthritis, unspecified site: M19.90

## 2015-02-24 HISTORY — PX: ESOPHAGOGASTRODUODENOSCOPY: SHX5428

## 2015-02-24 HISTORY — DX: Headache: R51

## 2015-02-24 HISTORY — DX: Angina pectoris, unspecified: I20.9

## 2015-02-24 HISTORY — DX: Acute embolism and thrombosis of unspecified deep veins of unspecified lower extremity: I82.409

## 2015-02-24 HISTORY — DX: Other pulmonary embolism without acute cor pulmonale: I26.99

## 2015-02-24 HISTORY — DX: Gastro-esophageal reflux disease without esophagitis: K21.9

## 2015-02-24 HISTORY — DX: Headache, unspecified: R51.9

## 2015-02-24 HISTORY — DX: Other specified postprocedural states: R11.2

## 2015-02-24 HISTORY — DX: Reserved for inherently not codable concepts without codable children: IMO0001

## 2015-02-24 HISTORY — DX: Other specified postprocedural states: Z98.890

## 2015-02-24 HISTORY — PX: COLONOSCOPY: SHX5424

## 2015-02-24 HISTORY — DX: Malignant (primary) neoplasm, unspecified: C80.1

## 2015-02-24 SURGERY — EGD (ESOPHAGOGASTRODUODENOSCOPY)
Anesthesia: Monitor Anesthesia Care

## 2015-02-24 MED ORDER — MEPERIDINE HCL 100 MG/ML IJ SOLN
INTRAMUSCULAR | Status: AC
  Filled 2015-02-24: qty 1

## 2015-02-24 MED ORDER — SODIUM CHLORIDE 0.9 % IV SOLN: INTRAVENOUS | Status: DC

## 2015-02-24 MED ORDER — FENTANYL CITRATE (PF) 100 MCG/2ML IJ SOLN
INTRAMUSCULAR | Status: AC
  Filled 2015-02-24: qty 2

## 2015-02-24 MED ORDER — MEPERIDINE HCL 50 MG/ML IJ SOLN
INTRAMUSCULAR | Status: DC | PRN
  Administered 2015-02-24 (×3): 20 mg
  Administered 2015-02-24: 10 mg
  Administered 2015-02-24: 20 mg
  Administered 2015-02-24: 10 mg

## 2015-02-24 MED ORDER — MIDAZOLAM HCL 5 MG/ML IJ SOLN
INTRAMUSCULAR | Status: AC
  Filled 2015-02-24: qty 4

## 2015-02-24 MED ORDER — MIDAZOLAM HCL 10 MG/2ML IJ SOLN
INTRAMUSCULAR | Status: DC | PRN
  Administered 2015-02-24: 2 mg via INTRAVENOUS
  Administered 2015-02-24: 1 mg via INTRAVENOUS
  Administered 2015-02-24: 2 mg via INTRAVENOUS

## 2015-02-24 MED ORDER — LACTATED RINGERS IV SOLN: INTRAVENOUS | Status: DC

## 2015-02-24 NOTE — Op Note (Signed)
[facility] [place], [address]   COLONOSCOPY PROCEDURE REPORT  PATIENT: Nathaniel [name], Nathaniel [name]  MR#: [id number] BIRTHDATE: [birth year] , 69  yrs. old GENDER: male ENDOSCOPIST: Ronald Lobo, MD REFERRED BY: PROCEDURE DATE:  03/07/2015 PROCEDURE:   colonoscopy ASA CLASS:   II INDICATIONS:  change in bowel habits, with smaller stools and sense of incomplete evacuation. Last colonoscopy was 5 years ago MEDICATIONS: Demerol 100 mg, Versed 5 mg IV (including upper endoscopy) (Demerol used because of history of nausea and vomiting to fentanyl in the past)  DESCRIPTION OF PROCEDURE:   After the risks and benefits and of the procedure were explained, informed consent was obtained.  This was done as an outpatient at the [facility] cone endoscopy unit.  Demerol was used for sedation, as noted above, because of a history of intolerance to fentanyl (nausea and vomiting).  This procedure was performed immediately following his upper endoscopy.  Digital rectal examination showed a normal, perhaps slightly enlarged prostate gland with no discrete nodules.  The Pentax Adult Colon [phone]  video colonoscope was introduced through the anus and advanced to the terminal ileum  .  The quality of the prep was excellent .  The instrument was then slowly withdrawn as the colon was fully examined. Estimated blood loss is zero unless otherwise noted in this procedure report.   This was a normal examination.      the scope was advanced quite easily around the colon to the proximal colon, where upon some external abdominal compression was used to facilitate advancement of the scope into the base of the cecum, which was identified by visualization of the appendiceal orifice. The terminal ileum was easily entered and looked normal.  Pullback was then performed. It is felt that all areas were well seen.  Apart from a possible early solitary diverticulum in the  sigmoid region, this was an entirely normal examination. No polyps, masses, colitis, vascular ectasia, or significant diverticular disease were noted.  Retroflexion in the rectum was unremarkable. Reinspection of the rectum disclosed no anatomic abnormalities.  Pullout through the anal canal showed just mild internal hemorrhoids.          The scope was then withdrawn from the patient and the procedure completed. No biopsies were obtained.  WITHDRAWAL TIME: 10 minutes  COMPLICATIONS: There were no immediate complications. ENDOSCOPIC IMPRESSION: Normal colonoscopy. No cause for patient's observed change in bowel habits identified.   RECOMMENDATIONS: Consider follow-up in the office for further attention to his symptoms. He might benefit from pelvic floor physical therapy or trials of various types of manipulations of fiber supplementation.  REPEAT EXAM:  cc:  _______________________________ eSignedRonald Lobo, MD 03-07-15 12:24 PM   CPT CODES: ICD CODES:  The ICD and CPT codes recommended by this software are interpretations from the data that the clinical staff has captured with the software.  The verification of the translation of this report to the ICD and CPT codes and modifiers is the sole responsibility of the health care institution and practicing physician where this report was generated.  [place]. will not be held responsible for the validity of the ICD and CPT codes included on this report.  AMA assumes no liability for data contained or not contained herein. CPT is a Designer, television/film set of the [employer].   PATIENT NAME:  Nathaniel [name], Nathaniel [name] MR#: [id number]

## 2015-02-24 NOTE — Op Note (Signed)
[facility] [place], [address]   ENDOSCOPY PROCEDURE REPORT  PATIENT: Nathaniel, [name]  MR#: [id number] BIRTHDATE: 11/08/1945 , 69  yrs. old GENDER: male ENDOSCOPIST:Lamonica Trueba, MD REFERRED BY: Dr. Lajean Manes PROCEDURE DATE:  2015/03/14 PROCEDURE:   upper endoscopy ASA CLASS:    II INDICATIONS: gastroesophageal reflux disease. Also, family history of esophageal cancer MEDICATION: Demerol 70 mg, Versed 5 mg IV (fentanyl not used because of history of nausea and vomiting to fentanyl) TOPICAL ANESTHETIC:   none  DESCRIPTION OF PROCEDURE:   After the risks and benefits of the procedure were explained, informed consent was obtained. the patient came as an outpatient to the [facility] cone endoscopy unit, where Demerol is available. The Pentax Gastroscope [id number] endoscope was introduced through the mouth  and advanced to the second portion of the duodenum .  The instrument was slowly withdrawn as the mucosa was fully examined. Estimated blood loss is zero unless otherwise noted in this procedure report.    The larynx was not seen during this procedure.  The patient was a little bit combative with initial sedation so we had to take the scope out and administer some additional sedation, after which we encountered no difficulties doing the exam, apart from the bite-block becoming partially dislodged so we had to work to get that repositioned so he would not bite the scope.  The esophagus was normal. A minimal 1 cm hiatal hernia was present, as previously noted. There was no evidence of Barrett's esophagus. There was no evidence of esophageal neoplasia or reflux esophagitis, varices or infection.  The stomach contained no significant residual and had normal mucosa throughout, including a retroflexed view of the cardia and fundus.  The pylorus, duodenal bulb, and second duodenum looked normal. Retroflexion in the stomach was normal,  as noted.         The scope was then withdrawn from the patient and the procedure completed. No biopsies were obtained.  COMPLICATIONS: There were no immediate complications.  ENDOSCOPIC IMPRESSION: Normal endoscopy. No adverse  sequelae of reflux identified.  RECOMMENDATIONS: Continue current medications   _______________________________ eSignedRonald Lobo, MD 2015/03/14 12:13 PM     cc:  CPT CODES: ICD CODES:  The ICD and CPT codes recommended by this software are interpretations from the data that the clinical staff has captured with the software.  The verification of the translation of this report to the ICD and CPT codes and modifiers is the sole responsibility of the health care institution and practicing physician where this report was generated.  [place]. will not be held responsible for the validity of the ICD and CPT codes included on this report.  AMA assumes no liability for data contained or not contained herein. CPT is a Designer, television/film set of the [employer].  PATIENT NAME:  Nathaniel, [name] MR#: [id number]

## 2015-02-24 NOTE — H&P (Signed)
Nathaniel [name] is an 69 y.o. male.   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 His reflux symptoms are currently under quite good control on PPI therapy. The change in bowel habits has him concerned, however. He has a tendency toward somewhat more frequent stools which are smaller in size and associated with a sense of incomplete evacuation. There's been no problem with rectal bleeding and stool was Hemoccult-negative.  Past Medical History  Diagnosis Date  . GERD (gastroesophageal reflux disease)   . Pruritus   . History of rheumatic fever   . Increased prostate specific antigen (PSA) velocity   . Anemia   . Allergic rhinitis   . Palpitations   . Mitral valve prolapse   . Hiatal hernia     small  . Hyperlipidemia   . Coronary artery disease     nonobstructive  . Complication of anesthesia     Fentyl- n/[name]  . Anginal pain     Microvascular angina- followed by Dr Burt Knack  . Heart murmur     MVP  . Headache     Vision Migraines  . Shortness of breath dyspnea     with Exertion  . Arthritis     back  . Cancer     Basal cell -   . DVT (deep venous thrombosis) 03/2011  . PE (pulmonary embolism) 03/2011  . PONV (postoperative nausea and vomiting)     Fentyl- N/[name]  . Laryngopharyngeal reflux     Past Surgical History  Procedure Laterality Date  . Cystoscopy    . Repair of an ac seperation of the left    . Tonsillectomy and adenoidectomy    . Esophagogastroduodenoscopy N/A 02/06/2013    Procedure: ESOPHAGOGASTRODUODENOSCOPY (EGD);  Surgeon: Cleotis Nipper, MD;  Location: [facility] ENDOSCOPY;  Service: Endoscopy;  Laterality: N/A;  . Colonoscopy    . Shoulder arthroscopy Left     A/C tear  . Humerus  fracture surgery Right 05/2003    History reviewed. No pertinent family history. Social History:  reports that he has never smoked. He does not have any smokeless tobacco history on file. He reports that he drinks alcohol. He reports that he does not use illicit drugs.  Allergies:  Allergies  Allergen Reactions  . Penicillins Anaphylaxis    Hands swell, throat swells  . Fentanyl Nausea And Vomiting  . Imdur [Isosorbide Dinitrate] Other (See Comments)    Headache    Medications Prior to Admission  Medication Sig Dispense Refill  . atorvastatin (LIPITOR) 20 MG tablet Take 1 tablet (20 mg total) by mouth daily. 90 tablet 3  . chlorpheniramine (CHLOR-TRIMETON) 4 MG tablet Take 4 mg by mouth daily as needed for allergies.    . cholecalciferol (VITAMIN D) 1000 UNITS tablet Take 1,000 Units by mouth every morning.     . Cyanocobalamin (VITAMIN B-12) 2500 MCG SUBL Place 1 tablet under the tongue every 7 (seven) days. Takes on Wednesday    . esomeprazole (NEXIUM) 40 MG capsule Take 40 mg by mouth at bedtime.    . ferrous sulfate 325 (65 FE) MG tablet Take 325 mg by mouth daily with breakfast.    . fish oil-omega-3 fatty acids 1000 MG capsule Take 1 g by mouth daily.      Marland Kitchen  Tamsulosin HCl (FLOMAX) 0.4 MG CAPS Take 0.4 mg by mouth at bedtime.     . enoxaparin (LOVENOX) 80 MG/0.8ML injection Take as directed during travel (Patient not taking: Reported on 02/18/2015) 2 Syringe 0  . pantoprazole (PROTONIX) 40 MG tablet Take 40 mg by mouth daily.      No results found for this or any previous visit (from the past 48 hour(s)). No results found.  ROS see history of present illness  Blood pressure 134/59, pulse 44, temperature 98.3 F (36.8 C), temperature source Oral, resp. rate 12, height 5\' 8"  (1.727 m), weight 80.74 kg (178 lb), SpO2 98 %. Physical Exam very healthy-appearing. No pallor or icterus. Does not come across as being anxious or depressed. He is very rational. The chest is clear. Heart  rate is low, around 45. No murmur, no irregularity of heart rhythm. Abdomen is without mass or tenderness. Oropharynx is benign  Assessment/Plan 1. Long-standing reflux, adequate symptomatic control but possibly at increased risk for neoplasia in view of family history 2. Change in bowel habits, status post negative colonoscopy 5 years ago  We will proceed to endoscopic and colonoscopic evaluation today. The patient has a previous history of nausea and vomiting after fentanyl, so we will use Demerol as our analgesia. He tolerated Demerol and Versed fine during his endoscopic procedures in the past.  Nathaniel [name] 02/24/2015, 10:19 AM

## 2015-02-24 NOTE — Discharge Instructions (Signed)
Colonoscopy, Care After °These instructions give you information on caring for yourself after your procedure. Your doctor may also give you more specific instructions. Call your doctor if you have any problems or questions after your procedure. °HOME CARE °· Do not drive for 24 hours. °· Do not sign important papers or use machinery for 24 hours. °· You may shower. °· You may go back to your usual activities, but go slower for the first 24 hours. °· Take rest breaks often during the first 24 hours. °· Walk around or use warm packs on your belly (abdomen) if you have belly cramping or gas. °· Drink enough fluids to keep your pee (urine) clear or pale yellow. °· Resume your normal diet. Avoid heavy or fried foods. °· Avoid drinking alcohol for 24 hours or as told by your doctor. °· Only take medicines as told by your doctor. °If a tissue sample (biopsy) was taken during the procedure:  °· Do not take aspirin or blood thinners for 7 days, or as told by your doctor. °· Do not drink alcohol for 7 days, or as told by your doctor. °· Eat soft foods for the first 24 hours. °GET HELP IF: °You still have a small amount of blood in your poop (stool) 2-3 days after the procedure. °GET HELP RIGHT AWAY IF: °· You have more than a small amount of blood in your poop. °· You see clumps of tissue (blood clots) in your poop. °· Your belly is puffy (swollen). °· You feel sick to your stomach (nauseous) or throw up (vomit). °· You have a fever. °· You have belly pain that gets worse and medicine does not help. °MAKE SURE YOU: °· Understand these instructions. °· Will watch your condition. °· Will get help right away if you are not doing well or get worse. °Document Released: 07/01/2010 Document Revised: 06/03/2013 Document Reviewed: 02/03/2013 °ExitCare® Patient Information ©2015 [employer]. This information is not intended to replace advice given to you by your health care provider. Make sure you discuss any questions you have with  your health care provider. °Esophagogastroduodenoscopy °Care After °Refer to this sheet in the next few weeks. These instructions provide you with information on caring for yourself after your procedure. Your caregiver may also give you more specific instructions. Your treatment has been planned according to current medical practices, but problems sometimes occur. Call your caregiver if you have any problems or questions after your procedure.  °HOME CARE INSTRUCTIONS °· Do not eat or drink anything until the numbing medicine (local anesthetic) has worn off and your gag reflex has returned. You will know that the local anesthetic has worn off when you can swallow comfortably. °· Do not drive for 12 hours after the procedure or as directed by your caregiver. °· Only take medicines as directed by your caregiver. °SEEK MEDICAL CARE IF:  °· You cannot stop coughing. °· You are not urinating at all or less than usual. °SEEK IMMEDIATE MEDICAL CARE IF: °· You have difficulty swallowing. °· You cannot eat or drink. °· You have worsening throat or chest pain. °· You have dizziness, lightheadedness, or you faint. °· You have nausea or vomiting. °· You have chills. °· You have a fever. °· You have severe abdominal pain. °· You have black, tarry, or bloody stools. °Document Released: 05/15/2012 Document Reviewed: 05/15/2012 °ExitCare® Patient Information ©2015 [employer]. This information is not intended to replace advice given to you by your health care provider. Make sure you   discuss any questions you have with your health care provider. ° °

## 2015-02-25 ENCOUNTER — Encounter (HOSPITAL_COMMUNITY): Payer: Self-pay | Admitting: Gastroenterology

## 2015-04-01 DIAGNOSIS — Z23 Encounter for immunization: Secondary | ICD-10-CM | POA: Diagnosis not present

## 2015-04-20 DIAGNOSIS — Z79899 Other long term (current) drug therapy: Secondary | ICD-10-CM | POA: Diagnosis not present

## 2015-04-20 DIAGNOSIS — E7800 Pure hypercholesterolemia, unspecified: Secondary | ICD-10-CM | POA: Diagnosis not present

## 2015-04-20 DIAGNOSIS — J069 Acute upper respiratory infection, unspecified: Secondary | ICD-10-CM | POA: Diagnosis not present

## 2015-04-26 DIAGNOSIS — R194 Change in bowel habit: Secondary | ICD-10-CM | POA: Diagnosis not present

## 2015-04-26 DIAGNOSIS — K219 Gastro-esophageal reflux disease without esophagitis: Secondary | ICD-10-CM | POA: Diagnosis not present

## 2015-05-03 DIAGNOSIS — R194 Change in bowel habit: Secondary | ICD-10-CM | POA: Diagnosis not present

## 2015-05-17 DIAGNOSIS — N411 Chronic prostatitis: Secondary | ICD-10-CM | POA: Diagnosis not present

## 2015-05-17 DIAGNOSIS — N400 Enlarged prostate without lower urinary tract symptoms: Secondary | ICD-10-CM | POA: Diagnosis not present

## 2015-05-17 DIAGNOSIS — R972 Elevated prostate specific antigen [PSA]: Secondary | ICD-10-CM | POA: Diagnosis not present

## 2015-05-20 ENCOUNTER — Other Ambulatory Visit: Payer: Self-pay | Admitting: Cardiovascular Disease

## 2015-06-16 ENCOUNTER — Encounter: Payer: Self-pay | Admitting: Sports Medicine

## 2015-06-16 ENCOUNTER — Ambulatory Visit (INDEPENDENT_AMBULATORY_CARE_PROVIDER_SITE_OTHER): Payer: Medicare Other | Admitting: Sports Medicine

## 2015-06-16 VITALS — BP 108/70 | Ht 68.0 in | Wt 175.0 lb

## 2015-06-16 DIAGNOSIS — M722 Plantar fascial fibromatosis: Secondary | ICD-10-CM | POA: Insufficient documentation

## 2015-06-16 DIAGNOSIS — M2142 Flat foot [pes planus] (acquired), left foot: Secondary | ICD-10-CM | POA: Diagnosis not present

## 2015-06-16 DIAGNOSIS — M2141 Flat foot [pes planus] (acquired), right foot: Secondary | ICD-10-CM

## 2015-06-16 NOTE — Assessment & Plan Note (Signed)
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  He was instructed on recommended stretching and ice water baths.he was also provided with sports training insoles with small scaphoid pads.  We will see him back in 4-6 weeks to assess his response.  He was counseled on the possible course of this illness.

## 2015-06-16 NOTE — Progress Notes (Signed)
  Cage Holleman Durio - 70 y.o. 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  He denies any trauma.  He denies any change in his activity.  He has been avid Primary school teacher. The pain is worse when he first wakes up in the morning or wakes up in the middle the night and walks on it.  Pain is also worse when he is working on [employer].  He has not tried any icing.  He has tried gel heel cups which has not provided any relief.  He has no history of any ankle or foot injury or previous episodes similar to this. He has not tried any stretching.  The pain is severe.  He denies any fevers chills or night sweats.  ROS:     14 point review of systems negative other than that listed in history of present illness above.  HISTORY: Past Medical, Surgical, Social, and Family History Reviewed & Updated per EMR.    OBJECTIVE: BP 108/70 mmHg  Ht 5\' 8"  (1.727 m)  Wt 175 lb (79.379 kg)  BMI 26.61 kg/m2  Physical Exam   nonlabored breathing, no acute distress.  Normal inspection with no visable or palpable fat pad atrophy and no visible swelling/erythema. Patient is tender at medial insertion of plantar fascia into calcaneus.no tenderness with calcaneal squeeze or throughout the Achilles gastrocsoleus complex. Great toe motion:no increased pain Arch shape:flat arch with a drop of navicular prominence Other foot breakdown:good maintenance of the transverse arch and no other forefoot abnormalities  MEDICATIONS, LABS & OTHER ORDERS: Previous Medications   ATORVASTATIN (LIPITOR) 20 MG TABLET    TAKE 1 TABLET BY MOUTH EVERY DAY   CHLORPHENIRAMINE (CHLOR-TRIMETON) 4 MG TABLET    Take 4 mg by mouth daily as needed for allergies.   CHOLECALCIFEROL (VITAMIN D) 1000 UNITS TABLET    Take 1,000 Units by mouth every morning.    CYANOCOBALAMIN (VITAMIN B-12) 2500  MCG SUBL    Place 1 tablet under the tongue every 7 (seven) days. Takes on Wednesday   ENOXAPARIN (LOVENOX) 80 MG/0.8ML INJECTION    Take as directed during travel   ESOMEPRAZOLE (NEXIUM) 40 MG CAPSULE    Take 40 mg by mouth at bedtime.   FERROUS SULFATE 325 (65 FE) MG TABLET    Take 325 mg by mouth daily with breakfast.   FISH OIL-OMEGA-3 FATTY ACIDS 1000 MG CAPSULE    Take 1 g by mouth daily.     TAMSULOSIN HCL (FLOMAX) 0.4 MG CAPS    Take 0.4 mg by mouth at bedtime.    Modified Medications   No medications on file   New Prescriptions   No medications on file   Discontinued Medications   PANTOPRAZOLE (PROTONIX) 40 MG TABLET    Take 40 mg by mouth daily.  No orders of the defined types were placed in this encounter.   ASSESSMENT & PLAN: See problem based charting & AVS for pt instructions.

## 2015-07-19 DIAGNOSIS — H903 Sensorineural hearing loss, bilateral: Secondary | ICD-10-CM | POA: Diagnosis not present

## 2015-07-19 DIAGNOSIS — H6123 Impacted cerumen, bilateral: Secondary | ICD-10-CM | POA: Diagnosis not present

## 2015-07-26 ENCOUNTER — Encounter: Payer: Self-pay | Admitting: Cardiovascular Disease

## 2015-07-26 ENCOUNTER — Ambulatory Visit (INDEPENDENT_AMBULATORY_CARE_PROVIDER_SITE_OTHER): Payer: Medicare Other | Admitting: Cardiovascular Disease

## 2015-07-26 VITALS — BP 122/70 | HR 47 | Ht 68.0 in | Wt 175.8 lb

## 2015-07-26 DIAGNOSIS — R194 Change in bowel habit: Secondary | ICD-10-CM | POA: Diagnosis not present

## 2015-07-26 DIAGNOSIS — R002 Palpitations: Secondary | ICD-10-CM | POA: Diagnosis not present

## 2015-07-26 DIAGNOSIS — I341 Nonrheumatic mitral (valve) prolapse: Secondary | ICD-10-CM | POA: Diagnosis not present

## 2015-07-26 DIAGNOSIS — I340 Nonrheumatic mitral (valve) insufficiency: Secondary | ICD-10-CM | POA: Diagnosis not present

## 2015-07-26 DIAGNOSIS — E785 Hyperlipidemia, unspecified: Secondary | ICD-10-CM | POA: Diagnosis not present

## 2015-07-26 DIAGNOSIS — K219 Gastro-esophageal reflux disease without esophagitis: Secondary | ICD-10-CM | POA: Diagnosis not present

## 2015-07-26 MED ORDER — ATORVASTATIN CALCIUM 20 MG PO TABS: 20.0000 mg | ORAL_TABLET | Freq: Every day | ORAL | Status: DC

## 2015-07-26 NOTE — Progress Notes (Signed)
Cardiology Office Note Date:  07/26/2015   ID:  Nathaniel [name], DOB 05/18/1946, MRN [id number]  PCP:  Mathews Argyle, MD  Cardiologist:  Sherren Mocha, MD    Chief Complaint  Patient presents with  . Follow-up    some arrhytnmia problems, per pt     History of Present Illness: Nathaniel [name] is a 70 y.o. male who presents for follow-up evaluation. The patient has been followed for microvascular angina, history of DVT/PE, bradycardia, and hyperlipidemia.  Complains of heart palpitations, generally the day after he exercises. He has minimal symptoms with physical exertion. He hasn't recognized any change in his anginal pattern. He is able to use a rowing machine without symptoms. However, the day following exercise, he oftentimes has heart palpitations in the morning. He feels long pauses in his heartbeat associated with lightheadedness. These have become increasingly bothersome. He has not had presyncope or frank syncope.   Past Medical History  Diagnosis Date  . GERD (gastroesophageal reflux disease)   . Pruritus   . History of rheumatic fever   . Increased prostate specific antigen (PSA) velocity   . Anemia   . Allergic rhinitis   . Palpitations   . Mitral valve prolapse   . Hiatal hernia     small  . Hyperlipidemia   . Coronary artery disease     nonobstructive  . Complication of anesthesia     Fentyl- n/v  . Anginal pain ([place])     Microvascular angina- followed by Dr Burt Knack  . Heart murmur     MVP  . Headache     Vision Migraines  . Shortness of breath dyspnea     with Exertion  . Arthritis     back  . Cancer ([facility])     Basal cell -   . DVT (deep venous thrombosis) ([place]) 03/2011  . PE (pulmonary embolism) 03/2011  . PONV (postoperative nausea and vomiting)     Fentyl- N/V  . Laryngopharyngeal reflux     Past Surgical History  Procedure Laterality Date  . Cystoscopy    . Repair of an ac seperation of the left    . Tonsillectomy and  adenoidectomy    . Esophagogastroduodenoscopy N/A 02/06/2013    Procedure: ESOPHAGOGASTRODUODENOSCOPY (EGD);  Surgeon: Cleotis Nipper, MD;  Location: [facility] ENDOSCOPY;  Service: Endoscopy;  Laterality: N/A;  . Colonoscopy    . Shoulder arthroscopy Left     A/C tear  . Humerus fracture surgery Right 05/2003  . Esophagogastroduodenoscopy N/A 02/24/2015    Procedure: ESOPHAGOGASTRODUODENOSCOPY (EGD);  Surgeon: Ronald Lobo, MD;  Location: [facility] ENDOSCOPY;  Service: Endoscopy;  Laterality: N/A;  . Colonoscopy N/A 02/24/2015    Procedure: COLONOSCOPY;  Surgeon: Ronald Lobo, MD;  Location: [facility] ENDOSCOPY;  Service: Endoscopy;  Laterality: N/A;    Current Outpatient Prescriptions  Medication Sig Dispense Refill  . atorvastatin (LIPITOR) 20 MG tablet Take 1 tablet (20 mg total) by mouth daily. 90 tablet 3  . chlorpheniramine (CHLOR-TRIMETON) 4 MG tablet Take 4 mg by mouth daily as needed for allergies.    . cholecalciferol (VITAMIN D) 1000 UNITS tablet Take 1,000 Units by mouth every morning.     . Cyanocobalamin (VITAMIN B-12) 2500 MCG SUBL Place 1 tablet under the tongue every 7 (seven) days. Takes on Wednesday    . enoxaparin (LOVENOX) 80 MG/0.8ML injection Take as directed during travel 2 Syringe 0  . esomeprazole (NEXIUM) 40 MG capsule Take 40 mg by mouth at bedtime.    Marland Kitchen  ferrous sulfate 325 (65 FE) MG tablet Take 325 mg by mouth daily with breakfast.    . fish oil-omega-3 fatty acids 1000 MG capsule Take 1 g by mouth daily.      . Tamsulosin HCl (FLOMAX) 0.4 MG CAPS Take 0.4 mg by mouth at bedtime.      No current facility-administered medications for this visit.    Allergies:   Penicillins; Fentanyl; and Imdur   Social History:  The patient  reports that he has never smoked. He does not have any smokeless tobacco history on file. He reports that he drinks alcohol. He reports that he does not use illicit drugs.   Family History:  The patient's fam hx is negative for premature CAD    ROS:   Please see the history of present illness.  All other systems are reviewed and negative.    PHYSICAL EXAM: VS:  BP 122/70 mmHg  Pulse 47  Ht 5\' 8"  (1.727 m)  Wt 79.742 kg (175 lb 12.8 oz)  BMI 26.74 kg/m2  SpO2 95% , BMI Body mass index is 26.74 kg/(m^2). GEN: Well nourished, well developed, in no acute distress HEENT: normal Neck: no JVD, no masses. No carotid bruits Cardiac: bradycardic and regular without murmur or gallop                Respiratory:  clear to auscultation bilaterally, normal work of breathing GI: soft, nontender, nondistended, + BS MS: no deformity or atrophy Ext: no pretibial edema, pedal pulses 2+= bilaterally Skin: warm and dry, no rash Neuro:  Strength and sensation are intact Psych: euthymic mood, full affect  EKG:  EKG is ordered today. The ekg ordered today shows Sinus bradycardia 47 bpm, otherwise within normal limits  Recent Labs: No results found for requested labs within last 365 days.   Lipid Panel     Component Value Date/Time   CHOL 124 05/15/2013 0926   TRIG 77.0 05/15/2013 0926   HDL 34.70* 05/15/2013 0926   CHOLHDL 4 05/15/2013 0926   VLDL 15.4 05/15/2013 0926   LDLCALC 74 05/15/2013 0926      Wt Readings from Last 3 Encounters:  07/26/15 79.742 kg (175 lb 12.8 oz)  06/16/15 79.379 kg (175 lb)  02/24/15 80.74 kg (178 lb)     Cardiac Studies Reviewed: Cardiopulmonary exercise test 09/14/2014: Conclusion: Normal cardiopulmonary exercise test with no cardiopulmonary limitations to exercise. At peak exercise there may be mild hypoventilation (respiratory rate only 37 breaths/min) however other parameters are normal and functional capacity exceeds predicted.   2-D echocardiogram 02/18/2013: Study Conclusions  - Left ventricle: The cavity size was normal. Wall thickness was increased in a pattern of mild LVH. Systolic function was normal. The estimated ejection fraction was in the range of 55% to 65%. Wall motion was  normal; there were no regional wall motion abnormalities. Features are consistent with a pseudonormal left ventricular filling pattern, with concomitant abnormal relaxation and increased filling pressure (grade 2 diastolic dysfunction). - Aortic valve: Mild regurgitation. - Mitral valve: Posterior leaflet prolapse Prolapse. Mild regurgitation. - Atrial septum: No defect or patent foramen ovale was identified. - Pulmonary arteries: PA peak pressure: 41mm Hg (S). - Pericardium, extracardiac: A trivial pericardial effusion was identified posterior to the heart.  ASSESSMENT AND PLAN: 1.  CAD, native vessel: Stable symptoms of mild angina with no recent change. The patient is CCS functional class I. He will continue on current medical therapy. Unable to take a beta blocker because of resting bradycardia.  2. Exertional dyspnea: No recent change. Cardiopulmonary exercise test within the past year reviewed and he has no cardiac limitation  3. Hyperlipidemia: Maintained on a statin drug, followed by his primary physician  4. Heart palpitations: The patient has marked bradycardia at rest. Other than a mildly prolonged PR interval at 204 ms, he has no significant conduction disease. Discussed potential treatment options for PVCs/PACs. He is not a candidate for beta blocker or calcium channel blocker because of marked bradycardia at rest. I recommended outpatient monitoring. He is reluctant to have a Holter or event monitor. Gave him the option of an alive cor monitor with website info. Advised I would be happy to review strips once he obtains some data.   5. MVP with MR: no murmur appreciated on exam. Recommend 2D Echo for follow-up. Last study obtained in 2014.   Current medicines are reviewed with the patient today.  The patient does not have concerns regarding medicines.  Labs/ tests ordered today include:   Orders Placed This Encounter  Procedures  . EKG 12-Lead  .  Echocardiogram   Disposition:   FU 6 months  Signed, Sherren Mocha, MD  07/26/2015 1:35 PM    [facility] [address], [place], [place]  [address] Phone: [phone]; Fax: [phone]

## 2015-07-26 NOTE — Patient Instructions (Addendum)
Medication Instructions:  Your physician recommends that you continue on your current medications as directed. Please refer to the Current Medication list given to you today.  Labwork: No new orders.   Testing/Procedures: Your physician has requested that you have an echocardiogram. Echocardiography is a painless test that uses sound waves to create images of your heart. It provides your doctor with information about the size and shape of your heart and how well your heart's chambers and valves are working. This procedure takes approximately one hour. There are no restrictions for this procedure.  Follow-Up: Your physician wants you to follow-up in: 6 MONTHS with Dr Burt Knack.  You will receive a reminder letter in the mail two months in advance. If you don't receive a letter, please call our office to schedule the follow-up appointment.  Any Other Special Instructions Will Be Listed Below (If Applicable).  Please use AliveCor for heart rate monitoring.      If you need a refill on your cardiac medications before your next appointment, please call your pharmacy.

## 2015-08-05 ENCOUNTER — Other Ambulatory Visit: Payer: Self-pay

## 2015-08-05 ENCOUNTER — Ambulatory Visit (HOSPITAL_COMMUNITY): Payer: Medicare Other | Attending: Cardiovascular Disease

## 2015-08-05 DIAGNOSIS — I340 Nonrheumatic mitral (valve) insufficiency: Secondary | ICD-10-CM | POA: Diagnosis not present

## 2015-08-05 DIAGNOSIS — I517 Cardiomegaly: Secondary | ICD-10-CM | POA: Diagnosis not present

## 2015-08-05 DIAGNOSIS — I351 Nonrheumatic aortic (valve) insufficiency: Secondary | ICD-10-CM | POA: Insufficient documentation

## 2015-08-05 DIAGNOSIS — I341 Nonrheumatic mitral (valve) prolapse: Secondary | ICD-10-CM | POA: Diagnosis not present

## 2015-08-05 DIAGNOSIS — R002 Palpitations: Secondary | ICD-10-CM

## 2015-08-05 DIAGNOSIS — I059 Rheumatic mitral valve disease, unspecified: Secondary | ICD-10-CM | POA: Diagnosis present

## 2015-08-10 ENCOUNTER — Encounter: Payer: Self-pay | Admitting: Cardiovascular Disease

## 2015-08-11 ENCOUNTER — Telehealth: Payer: Self-pay | Admitting: Cardiovascular Disease

## 2015-08-11 NOTE — Telephone Encounter (Signed)
New message   Pt is requesting for rn to call him back  Pt keeps requesting your personal email to send a recording to you of EKG

## 2015-08-11 NOTE — Telephone Encounter (Signed)
I spoke with the pt and Dr Burt Knack gave approval for the pt to send his recording to his [facility] email.

## 2015-08-13 ENCOUNTER — Telehealth: Payer: Self-pay | Admitting: Cardiovascular Disease

## 2015-08-13 DIAGNOSIS — R002 Palpitations: Secondary | ICD-10-CM

## 2015-08-13 NOTE — Telephone Encounter (Signed)
Called patient but got message that voicemail box is not set up. I have reviewed his strips and I think he is having second degree type I AV block but difficult to tell with certainty from a single lead (Alivecor monitor). There are pauses of about 2.5 s. Recommend a formal 24 hour monitor and an EP referral for further evaluation since he is highly symptomatic with this.   Sherren Mocha 08/13/2015 12:23 PM

## 2015-08-13 NOTE — Telephone Encounter (Signed)
Please call,concerning what he was talking to you about on Wednesday.

## 2015-08-13 NOTE — Telephone Encounter (Signed)
I spoke with the pt and made him aware of Dr Antionette Char recommendations. The pt does not feel like he will get adequate results from a 24 hour holter monitor and feels like it needs to be a 48 hour or event monitor.  The pt is concerned about the cost of this test and I will have someone from billing contact him to discuss. He will call me once he decides what type of monitor he feels will be appropriate to assess his heart rhythm.

## 2015-08-13 NOTE — Telephone Encounter (Signed)
Please see follow-up documentation in 08/13/2015 telephone encounter.

## 2015-08-16 ENCOUNTER — Ambulatory Visit (INDEPENDENT_AMBULATORY_CARE_PROVIDER_SITE_OTHER): Payer: Medicare Other

## 2015-08-16 DIAGNOSIS — R002 Palpitations: Secondary | ICD-10-CM | POA: Diagnosis not present

## 2015-08-16 NOTE — Telephone Encounter (Signed)
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  He has second degree type 1 and pauses on his smart phone APP and we need to do formal evaluation.  He is worried about the cost and I gave him number for [facility] billing but he want CPT codes.  Thank you, Ander Purpura

## 2015-08-16 NOTE — Telephone Encounter (Signed)
Spoke with pt, he will come to the office today at 4:30 pm to pick up the 48 hour holter monitor F/u appt made with dr allred per pt request. Will make dr cooper aware to check his e mails for pt information

## 2015-08-16 NOTE — Telephone Encounter (Signed)
Follow up:  Pt is calling back in to follow up with Lauren about some issues he was having last week and setting up appts for a holter monitor and an appt with an EP doctor. Please f/u with him.

## 2015-08-16 NOTE — Telephone Encounter (Addendum)
Spoke with pt, he had arrhythmia's this weekend and he sent those to dr cooper's e mail. He is ready to schedule a 48 hour monitor and EP appointment. Will find out when the pt can get a monitor and then schedule from there

## 2015-08-16 NOTE — Telephone Encounter (Signed)
I spoke with the patient and advised him that with his medicare and supplement plans that regardless of which monitor he ends up wearing that he will not out of pocket since his supplement will pick up what medicare does not cover. He has met his $183.00 for medicare this year. I explained to him the wide range of pricing on monitors due to the ability for the tech/monitoring company to change the codes as needed to meet the patient's needs.  For example they could upcode to a telemetry code which could change the price from $75.00 to $1450 so it was hard for me to give him an exact.  I just knew it should not be more than $1450.00.   He was appreciative of my call and voiced that he thinks he wants a 48 hour monitor.   I advised him I would let you know that I spoke with him and he is requesting a call back from you as soon as you have time.  Thank you!  Caryl Pina

## 2015-08-23 ENCOUNTER — Ambulatory Visit (INDEPENDENT_AMBULATORY_CARE_PROVIDER_SITE_OTHER): Payer: Medicare Other | Admitting: Sports Medicine

## 2015-08-23 ENCOUNTER — Encounter: Payer: Self-pay | Admitting: Sports Medicine

## 2015-08-23 VITALS — BP 105/67 | Ht 68.0 in | Wt 175.0 lb

## 2015-08-23 DIAGNOSIS — M722 Plantar fascial fibromatosis: Secondary | ICD-10-CM | POA: Diagnosis not present

## 2015-08-23 NOTE — Progress Notes (Signed)
   Subjective:    Patient ID: Nathaniel [name], male    DOB: 1946/03/15, 70 y.o.   MRN: [id number]  HPI   Patient comes in today for follow-up on right heel plantar fasciitis. His pain persists but it has not worsened. He has some days where his pain is minimal and other days where it is more noticeable. It does not seem to restrict his activity. He has been doing his plantar fascial stretches but he admits that he has not been doing them diligently. He has been icing as well. His green sports insoles are comfortable but he is not sure if anything has helped to this point. His symptoms have been present now for a total of 6 months.    Review of Systems     Objective:   Physical Exam  Well-developed, well-nourished. No acute distress  Right heel: There is tenderness to palpation directly over the origin of the plantar fascia. Negative calcaneal squeeze. No soft tissue swelling. Neurovascularly intact distally. Patient has a slight limp with first steps of ambulation which improves as he walks.  MSK ultrasound of the right heel was performed. Limited images of the plantar fascia were obtained. Plantar fascia measures 0.59 cm with diffuse edema. Findings consistent with plantar fasciitis.      Assessment & Plan:   6 months of plantar fasciitis pain  I recommended that we try custom orthotics. I also spent some time making sure that he understood the exercises that I wanted him to do. I primarily want him to do the seated plantar fascial stretch by dorsiflexing the great toe. I also want him to do soleus and gastroc stretches. I also recommended that he try a [place] sock when sleeping. Follow-up with me again in 6 weeks. Continue with activity as tolerated. Call with questions or concerns in the interim.  Total time spent with the patient was 30 minutes with greater than 50% of the time spent in face-to-face consultation discussing orthotic construction, instruction, and  fitting.  Patient was fitted for a : standard, cushioned, semi-rigid orthotic. The orthotic was heated and afterward the patient stood on the orthotic blank positioned on the orthotic stand. The patient was positioned in subtalar neutral position and 10 degrees of ankle dorsiflexion in a weight bearing stance. After completion of molding, a stable base was applied to the orthotic blank. The blank was ground to a stable position for weight bearing. Size: 9 Base: Blue EVA Posting: none Additional orthotic padding: none

## 2015-08-24 ENCOUNTER — Ambulatory Visit: Payer: Medicare Other | Admitting: Sports Medicine

## 2015-08-30 ENCOUNTER — Encounter: Payer: Self-pay | Admitting: Internal Medicine

## 2015-08-30 ENCOUNTER — Ambulatory Visit (INDEPENDENT_AMBULATORY_CARE_PROVIDER_SITE_OTHER): Payer: Medicare Other | Admitting: Internal Medicine

## 2015-08-30 VITALS — BP 112/68 | HR 50 | Ht 68.0 in | Wt 179.2 lb

## 2015-08-30 DIAGNOSIS — I25119 Atherosclerotic heart disease of native coronary artery with unspecified angina pectoris: Secondary | ICD-10-CM | POA: Diagnosis not present

## 2015-08-30 DIAGNOSIS — I495 Sick sinus syndrome: Secondary | ICD-10-CM | POA: Diagnosis not present

## 2015-08-30 DIAGNOSIS — R002 Palpitations: Secondary | ICD-10-CM | POA: Diagnosis not present

## 2015-08-30 LAB — TSH: TSH: 1.45 m[IU]/L (ref 0.40–4.50)

## 2015-08-30 NOTE — Patient Instructions (Signed)
Medication Instructions:  Your physician recommends that you continue on your current medications as directed. Please refer to the Current Medication list given to you today.   Labwork: Your physician recommends that you return for lab work today: TSH   Testing/Procedures: None ordered   Follow-Up: Your physician recommends that you schedule a follow-up appointment in: 6 weeks with Dr Rayann Heman   Any Other Special Instructions Will Be Listed Below (If Applicable).     If you need a refill on your cardiac medications before your next appointment, please call your pharmacy.

## 2015-08-30 NOTE — Progress Notes (Signed)
Electrophysiology Office Note   Date:  08/30/2015   ID:  Nathaniel [name], DOB [birth year], MRN [id number]  PCP:  Mathews Argyle, MD  Cardiologist:  Dr Burt Knack (formerly Dr Olevia Perches) Primary Electrophysiologist: Thompson Grayer, MD    CC: bradycardia   History of Present Illness: Nathaniel [name] is a 70 y.o. male who presents today for electrophysiology evaluation.   He has a h/o microvascular coronary disease who is referred by Dr Burt Knack for further evaluation of palpitations and bradycardia.  He has exertional angina chronically.  He finds with rowing or exercise that he will have palpitations the next day.  He denies exertional palpitations.  Over the next couple days after heavy workout, he notices that his heart is beating irregularly.  He also feels pauses.  He finds this to be a nuisance and distracting.  He feels a little "fuzzy" with these events.  He denies dizziness, presyncope or syncope. He has limited workup due to this.  He has an Financial controller device and has documented sinus pauses that correlate with this.  Today, he denies symptoms of orthopnea, PND, lower extremity edema, claudication, dizziness, presyncope, syncope, bleeding, or neurologic sequela. The patient is tolerating medications without difficulties and is otherwise without complaint today.    Past Medical History  Diagnosis Date  . GERD (gastroesophageal reflux disease)   . Pruritus     resolved  . History of rheumatic fever     questionable  . Increased prostate specific antigen (PSA) velocity   . Anemia     pt unaware  . Allergic rhinitis   . Palpitations   . Mitral valve prolapse   . Hiatal hernia     small  . Hyperlipidemia   . Coronary artery disease     nonobstructive  . Anginal pain ([place])     Microvascular angina- followed by Dr Burt Knack  . Headache     Vision Migraines  . Shortness of breath dyspnea     with Exertion  . Arthritis     back  . Cancer (HCC)     Basal cell -   . DVT (deep  venous thrombosis) ([place]) 03/2011    after prolonged travel  . PE (pulmonary embolism) 03/2011    after prolonged travel  . PONV (postoperative nausea and vomiting)     Fentyl- N/V  . Laryngopharyngeal reflux   . Sinus bradycardia    Past Surgical History  Procedure Laterality Date  . Cystoscopy    . Repair of an ac seperation of the left    . Tonsillectomy and adenoidectomy    . Esophagogastroduodenoscopy N/A 02/06/2013    Procedure: ESOPHAGOGASTRODUODENOSCOPY (EGD);  Surgeon: Cleotis Nipper, MD;  Location: [facility] ENDOSCOPY;  Service: Endoscopy;  Laterality: N/A;  . Colonoscopy    . Shoulder arthroscopy Left     A/C tear  . Humerus fracture surgery Right 05/2003  . Esophagogastroduodenoscopy N/A 02/24/2015    Procedure: ESOPHAGOGASTRODUODENOSCOPY (EGD);  Surgeon: Ronald Lobo, MD;  Location: [facility] ENDOSCOPY;  Service: Endoscopy;  Laterality: N/A;  . Colonoscopy N/A 02/24/2015    Procedure: COLONOSCOPY;  Surgeon: Ronald Lobo, MD;  Location: [facility] ENDOSCOPY;  Service: Endoscopy;  Laterality: N/A;     Current Outpatient Prescriptions  Medication Sig Dispense Refill  . atorvastatin (LIPITOR) 20 MG tablet Take 1 tablet (20 mg total) by mouth daily. 90 tablet 3  . cholecalciferol (VITAMIN D) 1000 UNITS tablet Take 1,000 Units by mouth every morning.     . Cyanocobalamin (VITAMIN B-12) 2500  MCG SUBL Place 1 tablet under the tongue every 7 (seven) days. Takes on Wednesday    . esomeprazole (NEXIUM) 40 MG capsule Take 40 mg by mouth at bedtime.    . fish oil-omega-3 fatty acids 1000 MG capsule Take 1 g by mouth daily.      . Tamsulosin HCl (FLOMAX) 0.4 MG CAPS Take 0.4 mg by mouth at bedtime.      No current facility-administered medications for this visit.    Allergies:   Penicillins; Fentanyl; and Imdur   Social History:  The patient  reports that he has never smoked. He does not have any smokeless tobacco history on file. He reports that he drinks alcohol. He reports that he does not use  illicit drugs.   Family History:  The patient's father had CAD and mitral valve disease,    ROS:  Please see the history of present illness.   All other systems are reviewed and negative.    PHYSICAL EXAM: VS:  BP 112/68 mmHg  Pulse 50  Ht 5\' 8"  (1.727 m)  Wt 179 lb 3.2 oz (81.285 kg)  BMI 27.25 kg/m2 , BMI Body mass index is 27.25 kg/(m^2). GEN: Well nourished, well developed, in no acute distress HEENT: normal Neck: no JVD, carotid bruits, or masses Cardiac: RRR; no murmurs, rubs, or gallops,no edema  Respiratory:  clear to auscultation bilaterally, normal work of breathing GI: soft, nontender, nondistended, + BS MS: no deformity or atrophy Skin: warm and dry  Neuro:  Strength and sensation are intact Psych: euthymic mood, full affect  EKG:  EKG 2\13\17 reveals sinus bradycardia 47 bpm, first degree AV block   Recent Labs: No results found for requested labs within last 365 days.    Lipid Panel     Component Value Date/Time   CHOL 124 05/15/2013 0926   TRIG 77.0 05/15/2013 0926   HDL 34.70* 05/15/2013 0926   CHOLHDL 4 05/15/2013 0926   VLDL 15.4 05/15/2013 0926   LDLCALC 74 05/15/2013 0926     Wt Readings from Last 3 Encounters:  08/30/15 179 lb 3.2 oz (81.285 kg)  08/23/15 175 lb (79.379 kg)  07/26/15 175 lb 12.8 oz (79.742 kg)      Other studies Reviewed: Additional studies/ records that were reviewed today include: Dr York Cerise notes, echo, 48 hour holter  Review of the above records today demonstrates: sinus rhythm with occasional sinus bradycardia and sinus pauses of up to 2.8 seconds during daytime hours,  Prior CPX 2016 is also reviewed and reveals preserved exercise effort with adequate heart rate response to exercise   ASSESSMENT AND PLAN:  1.  Sinus node dysfunction/ symptomatic bradycardia The patient has episodic palpitations which have been documented to be due to sinus pauses.  There is pp prolongation prior to pause and is suggestive of  increased vagal tone.  Episodes are confirmed to be due to bradycardia on review of his AliveCor app today.  He feels quite strongly that episodes are stimulated by rigorous exercise/ rowing and improve with avoidance.  Therapeutic strategies for symptomatic bradycardia including lifestyle modification (reduction in rigorous exercise), medicine (theophylline) and pacemaker implantation.  At this time, he would prefer to continue lifestyle modification with reduction of rigorous exercise.  If symptoms do not improve, we may need to consider other options.  Avoid chronotropic agents at this time. Check TFTs  2. CAD No ischemic symptoms Continue current medicines   Follow-up:  Return to see me in 6 weeks  Current medicines are  reviewed at length with the patient today.   The patient does not have concerns regarding his medicines.  The following changes were made today:  none   Signed, Thompson Grayer, MD  08/30/2015 1:16 PM     [place] [address] [place] [address] [phone] (office) [phone] (fax)

## 2015-10-13 ENCOUNTER — Ambulatory Visit (INDEPENDENT_AMBULATORY_CARE_PROVIDER_SITE_OTHER): Payer: Medicare Other | Admitting: Internal Medicine

## 2015-10-13 ENCOUNTER — Encounter: Payer: Self-pay | Admitting: Internal Medicine

## 2015-10-13 VITALS — BP 136/70 | HR 50 | Ht 68.0 in | Wt 176.6 lb

## 2015-10-13 DIAGNOSIS — I495 Sick sinus syndrome: Secondary | ICD-10-CM | POA: Diagnosis not present

## 2015-10-13 DIAGNOSIS — I25119 Atherosclerotic heart disease of native coronary artery with unspecified angina pectoris: Secondary | ICD-10-CM

## 2015-10-13 NOTE — Patient Instructions (Signed)
Medication Instructions:  Your physician recommends that you continue on your current medications as directed. Please refer to the Current Medication list given to you today.  Labwork: None ordered.  Testing/Procedures: None ordered.  Follow-Up: Your physician recommends that you schedule a follow-up appointment as needed.   Any Other Special Instructions Will Be Listed Below (If Applicable).     If you need a refill on your cardiac medications before your next appointment, please call your pharmacy.   

## 2015-10-13 NOTE — Progress Notes (Signed)
PCP: Mathews Argyle, MD Primary Cardiologist:  Dr Nancee Liter Gracia is a 70 y.o. male who presents today for routine electrophysiology followup.  Since last being seen in our clinic, the patient reports doing very well.  He is not using his rowing machine and finds palpitations to be much improved.  Interestingly, when he rows on water he does not have palpitations.  Today, he denies symptoms of chest pain, shortness of breath,  lower extremity edema, dizziness, presyncope, or syncope.  The patient is otherwise without complaint today.   Past Medical History  Diagnosis Date  . GERD (gastroesophageal reflux disease)   . Pruritus     resolved  . History of rheumatic fever     questionable  . Increased prostate specific antigen (PSA) velocity   . Anemia     pt unaware  . Allergic rhinitis   . Palpitations   . Mitral valve prolapse   . Hiatal hernia     small  . Hyperlipidemia   . Coronary artery disease     nonobstructive  . Anginal pain ([place])     Microvascular angina- followed by Dr Burt Knack  . Headache     Vision Migraines  . Shortness of breath dyspnea     with Exertion  . Arthritis     back  . Cancer (HCC)     Basal cell -   . DVT (deep venous thrombosis) ([place]) 03/2011    after prolonged travel  . PE (pulmonary embolism) 03/2011    after prolonged travel  . PONV (postoperative nausea and vomiting)     Fentyl- N/V  . Laryngopharyngeal reflux   . Sinus bradycardia    Past Surgical History  Procedure Laterality Date  . Cystoscopy    . Repair of an ac seperation of the left    . Tonsillectomy and adenoidectomy    . Esophagogastroduodenoscopy N/A 02/06/2013    Procedure: ESOPHAGOGASTRODUODENOSCOPY (EGD);  Surgeon: Cleotis Nipper, MD;  Location: [facility] ENDOSCOPY;  Service: Endoscopy;  Laterality: N/A;  . Colonoscopy    . Shoulder arthroscopy Left     A/C tear  . Humerus fracture surgery Right 05/2003  . Esophagogastroduodenoscopy N/A 02/24/2015    Procedure:  ESOPHAGOGASTRODUODENOSCOPY (EGD);  Surgeon: Ronald Lobo, MD;  Location: [facility] ENDOSCOPY;  Service: Endoscopy;  Laterality: N/A;  . Colonoscopy N/A 02/24/2015    Procedure: COLONOSCOPY;  Surgeon: Ronald Lobo, MD;  Location: [facility] ENDOSCOPY;  Service: Endoscopy;  Laterality: N/A;    ROS- all systems are reviewed and negatives except as per HPI above  Current Outpatient Prescriptions  Medication Sig Dispense Refill  . atorvastatin (LIPITOR) 20 MG tablet Take 1 tablet (20 mg total) by mouth daily. 90 tablet 3  . cholecalciferol (VITAMIN D) 1000 UNITS tablet Take 1,000 Units by mouth every morning.     . Cyanocobalamin (VITAMIN B-12) 2500 MCG SUBL Place 1 tablet under the tongue every 7 (seven) days. Takes on Wednesday    . esomeprazole (NEXIUM) 40 MG capsule Take 40 mg by mouth at bedtime.    . fish oil-omega-3 fatty acids 1000 MG capsule Take 1 g by mouth daily.      . Tamsulosin HCl (FLOMAX) 0.4 MG CAPS Take 0.4 mg by mouth at bedtime.      No current facility-administered medications for this visit.    Physical Exam: Filed Vitals:   10/13/15 1404  BP: 136/70  Pulse: 50  Height: 5\' 8"  (1.727 m)  Weight: 176 lb 9.6 oz (80.105 kg)  GEN- The patient is well appearing, alert and oriented x 3 today.   Head- normocephalic, atraumatic Eyes-  Sclera clear, conjunctiva pink Ears- hearing intact Oropharynx- clear Lungs- Clear to ausculation bilaterally, normal work of breathing Heart- Regular rate and rhythm, no murmurs, rubs or gallops, PMI not laterally displaced GI- soft, NT, ND, + BS Extremities- no clubbing, cyanosis, or edema  ekg today reveals sinus rhythm 50 bpm  Assessment and Plan:  1. Sinus node dysfunction/ symptomatic bradycardia The patient has episodic palpitations which have been documented to be due to sinus pauses. There is pp prolongation prior to pause and is suggestive of increased vagal tone. Episodes have improved with adjusting his exercise regimen to avoid  the rowing machine. At this time, I would recommend that he continue his current lifestyle. If he has worsening symptoms of palpitations or certainly should he have any presyncope or syncope then we will consider pacing.  2. CAD No ischemic symptoms Continue current medicines  Return to see me as needed Follow-up with Dr Burt Knack as scheduled  Thompson Grayer MD, [facility] 10/13/2015 2:43 PM

## 2015-10-25 DIAGNOSIS — R143 Flatulence: Secondary | ICD-10-CM | POA: Diagnosis not present

## 2015-10-25 DIAGNOSIS — R194 Change in bowel habit: Secondary | ICD-10-CM | POA: Diagnosis not present

## 2015-11-03 DIAGNOSIS — R21 Rash and other nonspecific skin eruption: Secondary | ICD-10-CM | POA: Diagnosis not present

## 2015-11-03 DIAGNOSIS — E7800 Pure hypercholesterolemia, unspecified: Secondary | ICD-10-CM | POA: Diagnosis not present

## 2015-11-03 DIAGNOSIS — N183 Chronic kidney disease, stage 3 (moderate): Secondary | ICD-10-CM | POA: Diagnosis not present

## 2015-11-03 DIAGNOSIS — Z0000 Encounter for general adult medical examination without abnormal findings: Secondary | ICD-10-CM | POA: Diagnosis not present

## 2015-11-03 DIAGNOSIS — Z79899 Other long term (current) drug therapy: Secondary | ICD-10-CM | POA: Diagnosis not present

## 2015-11-15 DIAGNOSIS — R972 Elevated prostate specific antigen [PSA]: Secondary | ICD-10-CM | POA: Diagnosis not present

## 2015-11-15 DIAGNOSIS — N401 Enlarged prostate with lower urinary tract symptoms: Secondary | ICD-10-CM | POA: Diagnosis not present

## 2015-11-15 DIAGNOSIS — N138 Other obstructive and reflux uropathy: Secondary | ICD-10-CM | POA: Insufficient documentation

## 2015-11-15 DIAGNOSIS — R351 Nocturia: Secondary | ICD-10-CM | POA: Diagnosis not present

## 2016-01-27 ENCOUNTER — Telehealth: Payer: Self-pay | Admitting: Cardiovascular Disease

## 2016-01-27 ENCOUNTER — Other Ambulatory Visit (INDEPENDENT_AMBULATORY_CARE_PROVIDER_SITE_OTHER): Payer: Medicare Other

## 2016-01-27 DIAGNOSIS — R002 Palpitations: Secondary | ICD-10-CM

## 2016-01-27 DIAGNOSIS — R42 Dizziness and giddiness: Secondary | ICD-10-CM

## 2016-01-27 NOTE — Telephone Encounter (Signed)
Pt came into the office and monitored by EKG. EKG performed shows SB 48 bpm.  The pt developed symptoms of feeling "fuzzy" headed and irregular pulse.  Rhythm strip obtained which shows SB lowest rate 39 and pauses around 3 seconds. Dr Curt Bears (DOD) reviewed and advised that the pt needs follow-up with Dr Rayann Heman and should not drive.  Appointment arranged with Dr Rayann Heman on 01/31/16 and pt informed that per our recommendation he should not drive.  Pt verbalized understanding of these instructions.

## 2016-01-27 NOTE — Telephone Encounter (Signed)
New message        Talk to the nurse about his slow heartrate---40.  He is also lightheaded.  Please advise

## 2016-01-27 NOTE — Telephone Encounter (Signed)
Agree. thanks

## 2016-01-27 NOTE — Telephone Encounter (Signed)
I spoke with the pt and he states that his symptoms are getting worse and that his pulse is getting lower and irregularity of pulse has worsened.  The pt also complains of worsening of lightheadedness which he attributes to slower pulse.  The pt's pulse today was 40-42. These episodes occur multiple times per day.  The pt does have Alivecor but he does not feel like this is adequate to determine exactly what is occurring with his heart.  I offered to have the pt come into the office for an EKG and see if we can obtain an EKG while he is symptomatic.  The pt agreed.

## 2016-01-29 IMAGING — MR MR PELVIS WO/W CM
4 of 9 series · 16 of 48 positions shown · IV contrast (multihance)
Comparison: None.

CLINICAL DATA: Pelvic pain. Perineal and testicular pain for 6
weeks.

EXAM:
MRI PELVIS WITHOUT AND WITH CONTRAST
TECHNIQUE: Multiplanar multisequence MR imaging of the pelvis was performed
both before and after administration of intravenous contrast.
CONTRAST:  15mL MULTIHANCE GADOBENATE DIMEGLUMINE 529 MG/ML IV SOLN

[Series 3: T2 · coronal · 6.0mm · 1.48mm/px · 4 of 23 slices shown]
[im 1/23]
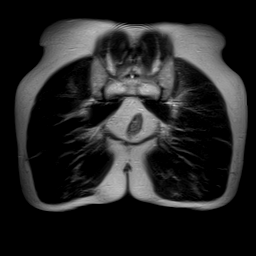
[im 8/23]
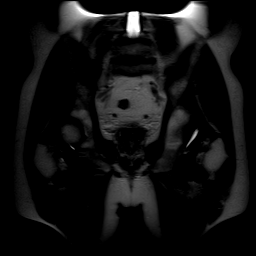
[im 15/23]
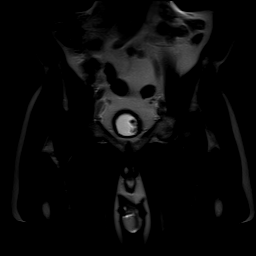
[im 23/23]
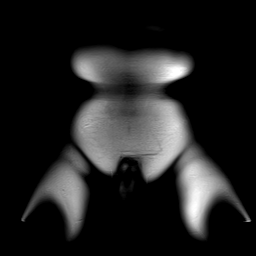

[Series 4: t2_tse_sag · sagittal · 5.0mm · 0.47mm/px · 4 of 27 slices shown]
[im 1/27]
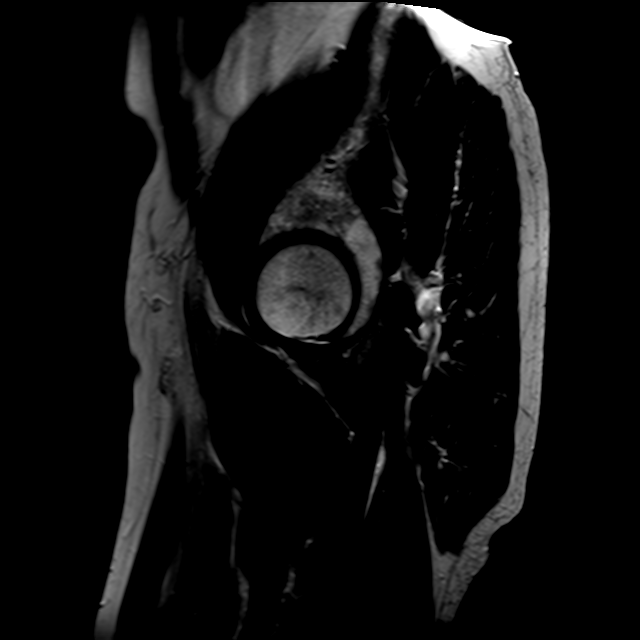
[im 9/27]
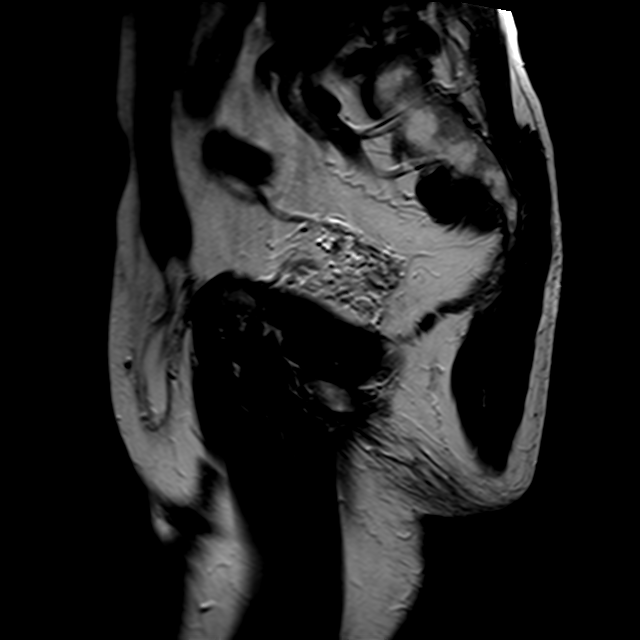
[im 18/27]
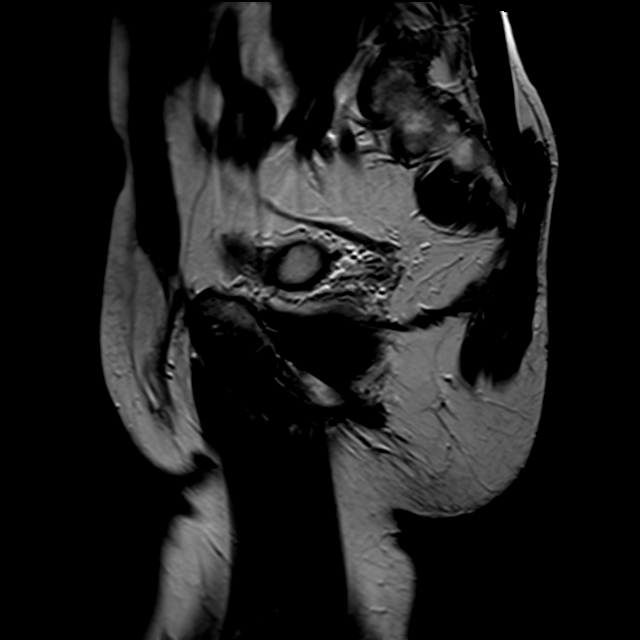
[im 27/27]
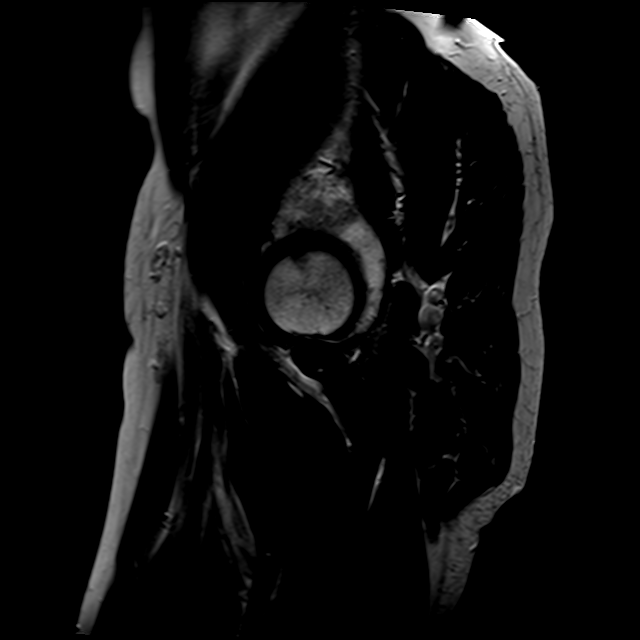

[Series 5: t2_tse axial · axial · 5.0mm · 0.41mm/px · z∈[-142,+80]mm · 5 of 46 slices shown]
[im 1/46]
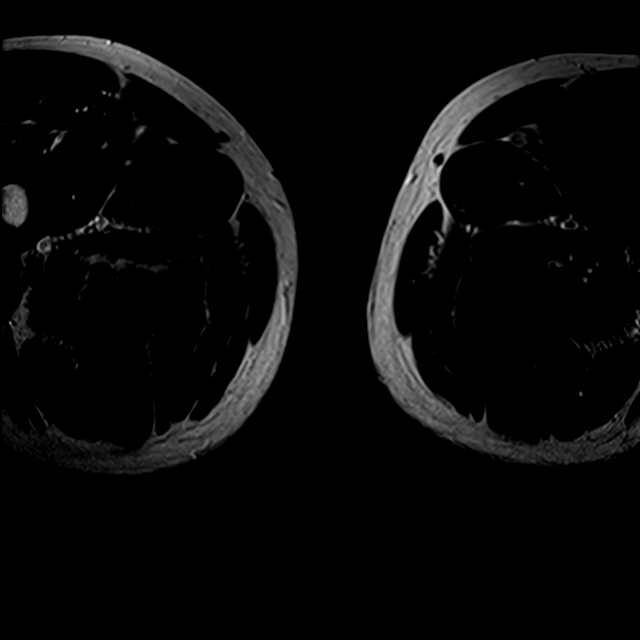
[im 8/46]
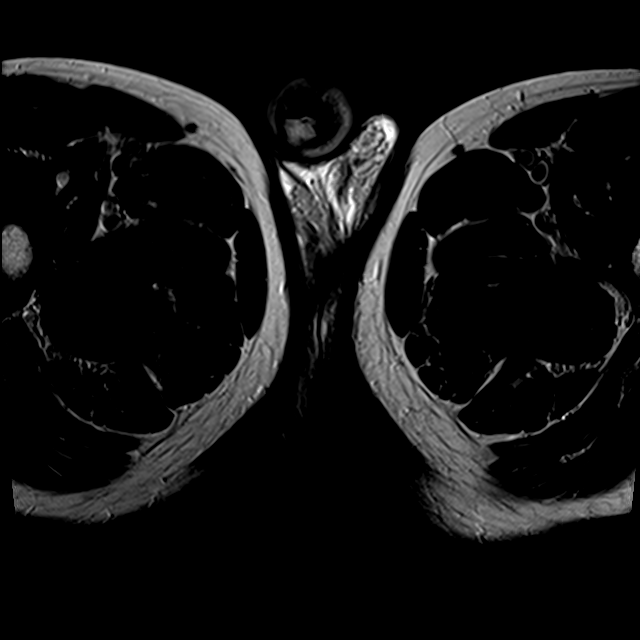
[im 16/46]
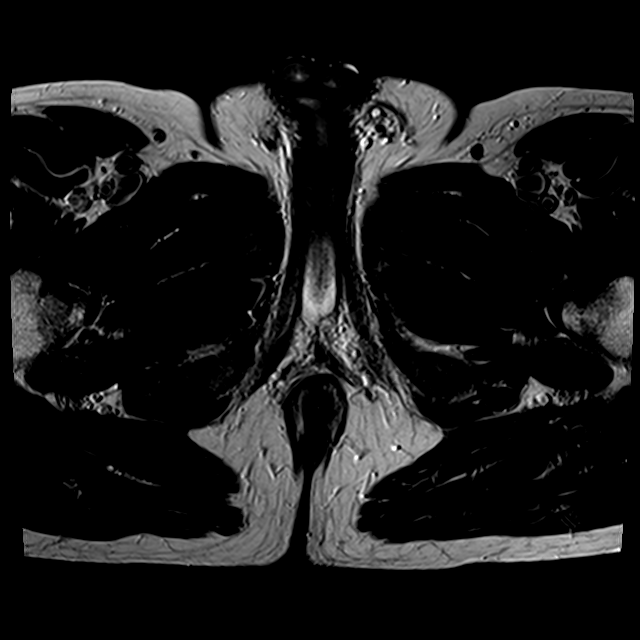
[im 23/46]
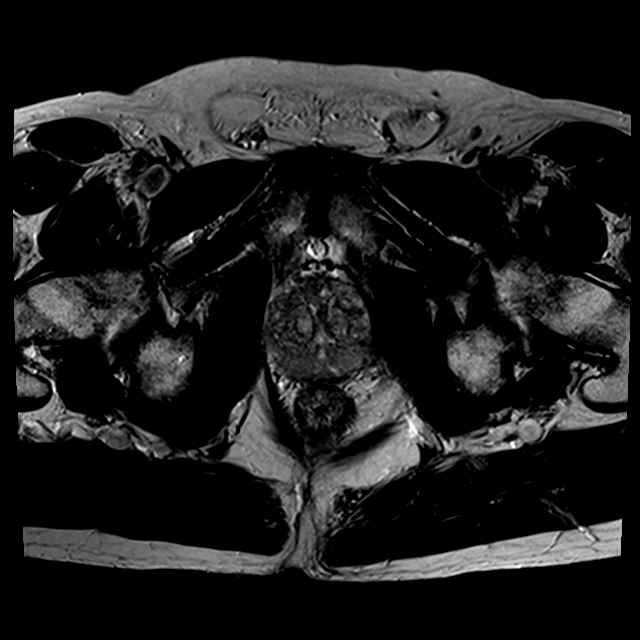
[im 38/46]
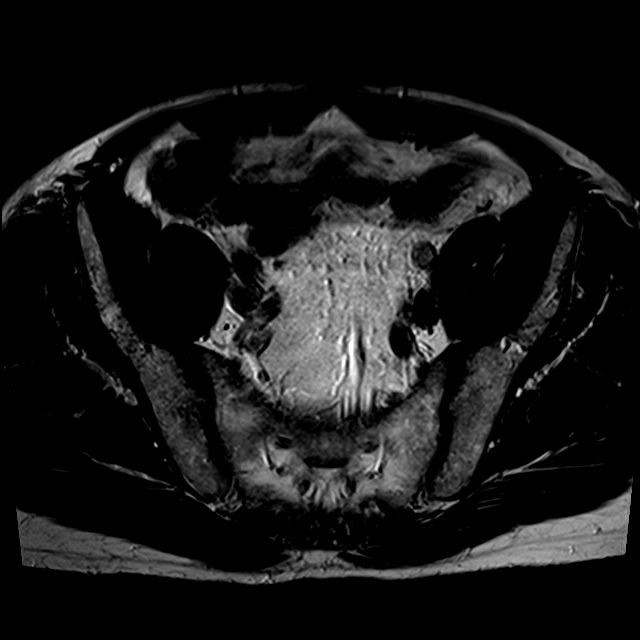

[Series 6: t2_tse axial fs · axial · 5.0mm · 0.41mm/px · z∈[-100,+80]mm · 3 of 46 slices shown]
[im 8/46]
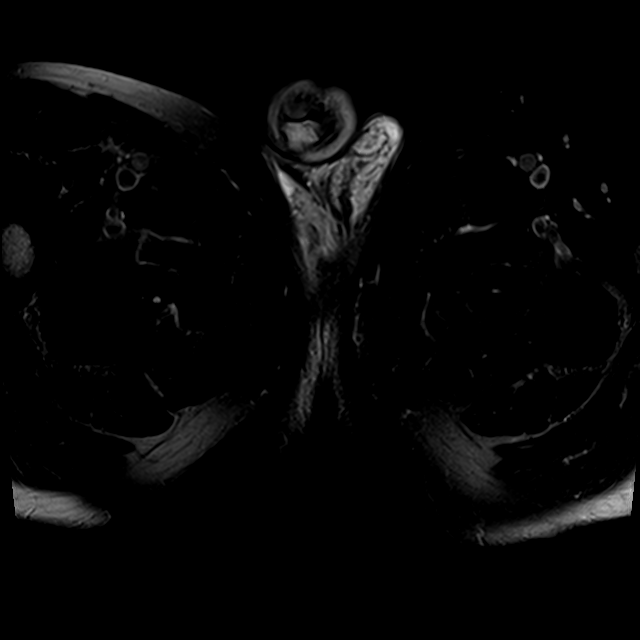
[im 23/46]
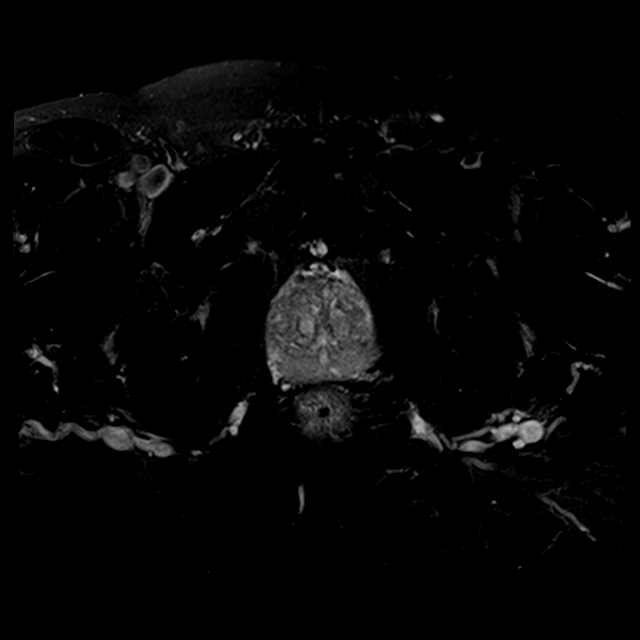
[im 38/46]
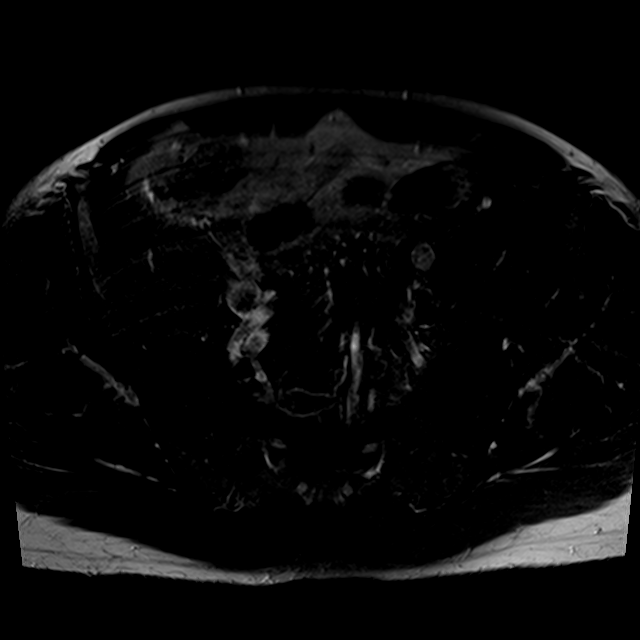

[16 of 48 positions shown; findings below may reference images not displayed]

FINDINGS: Urinary bladder appears normal. The prostate gland of measures 4.4 x
3.6 by 4 cm normal symmetric appearance of the seminal vesicles.

There is no free fluid identified within the pelvis. There are no
enlarged pelvic or inguinal lymph nodes. No mass identified.

No hernia identified. There are small bilateral hydroceles. No
testicular mass noted.

Fat containing left inguinal hernia noted.
IMPRESSION: 1. No acute findings identified.  No mass or adenopathy identified.
2. Small bilateral hydroceles.
3. Small fat containing left inguinal hernia

## 2016-01-31 ENCOUNTER — Ambulatory Visit (INDEPENDENT_AMBULATORY_CARE_PROVIDER_SITE_OTHER): Payer: Medicare Other | Admitting: Internal Medicine

## 2016-01-31 ENCOUNTER — Encounter: Payer: Self-pay | Admitting: Internal Medicine

## 2016-01-31 VITALS — BP 110/72 | HR 50 | Ht 68.0 in | Wt 176.6 lb

## 2016-01-31 DIAGNOSIS — R002 Palpitations: Secondary | ICD-10-CM | POA: Diagnosis not present

## 2016-01-31 DIAGNOSIS — I25119 Atherosclerotic heart disease of native coronary artery with unspecified angina pectoris: Secondary | ICD-10-CM

## 2016-01-31 DIAGNOSIS — I2510 Atherosclerotic heart disease of native coronary artery without angina pectoris: Secondary | ICD-10-CM

## 2016-01-31 DIAGNOSIS — I495 Sick sinus syndrome: Secondary | ICD-10-CM

## 2016-01-31 NOTE — Progress Notes (Signed)
PCP: Mathews Argyle, MD Primary Cardiologist:  Dr Nancee Liter Roland is a 70 y.o. male who presents today for routine electrophysiology followup.  His palpitations have become more noticeable.  He has them more often and they appear to be lasting longer.  Today, he denies symptoms of chest pain, shortness of breath,  lower extremity edema, dizziness, presyncope, or syncope.  The patient is otherwise without complaint today.   Past Medical History:  Diagnosis Date  . Allergic rhinitis   . Anemia    pt unaware  . Anginal pain ([place])    Microvascular angina- followed by Dr Burt Knack  . Arthritis    back  . Cancer (HCC)    Basal cell -   . Coronary artery disease    nonobstructive  . DVT (deep venous thrombosis) ([place]) 03/2011   after prolonged travel  . GERD (gastroesophageal reflux disease)   . Headache    Vision Migraines  . Hiatal hernia    small  . History of rheumatic fever    questionable  . Hyperlipidemia   . Increased prostate specific antigen (PSA) velocity   . Laryngopharyngeal reflux   . Mitral valve prolapse   . Palpitations   . PE (pulmonary embolism) 03/2011   after prolonged travel  . PONV (postoperative nausea and vomiting)    Fentyl- N/V  . Pruritus    resolved  . Shortness of breath dyspnea    with Exertion  . Sinus bradycardia    Past Surgical History:  Procedure Laterality Date  . COLONOSCOPY    . COLONOSCOPY N/A 02/24/2015   Procedure: COLONOSCOPY;  Surgeon: Ronald Lobo, MD;  Location: [facility] ENDOSCOPY;  Service: Endoscopy;  Laterality: N/A;  . CYSTOSCOPY    . ESOPHAGOGASTRODUODENOSCOPY N/A 02/06/2013   Procedure: ESOPHAGOGASTRODUODENOSCOPY (EGD);  Surgeon: Cleotis Nipper, MD;  Location: [facility] ENDOSCOPY;  Service: Endoscopy;  Laterality: N/A;  . ESOPHAGOGASTRODUODENOSCOPY N/A 02/24/2015   Procedure: ESOPHAGOGASTRODUODENOSCOPY (EGD);  Surgeon: Ronald Lobo, MD;  Location: [facility] ENDOSCOPY;  Service: Endoscopy;  Laterality: N/A;  . HUMERUS FRACTURE  SURGERY Right 05/2003  . Repair of an AC seperation of the left    . SHOULDER ARTHROSCOPY Left    A/C tear  . TONSILLECTOMY AND ADENOIDECTOMY      ROS- all systems are reviewed and negatives except as per HPI above  Current Outpatient Prescriptions  Medication Sig Dispense Refill  . atorvastatin (LIPITOR) 20 MG tablet Take 1 tablet (20 mg total) by mouth daily. 90 tablet 3  . cholecalciferol (VITAMIN D) 1000 UNITS tablet Take 1,000 Units by mouth every morning.     . Cyanocobalamin (VITAMIN B-12) 2500 MCG SUBL Place 1 tablet under the tongue every 7 (seven) days. Takes on Wednesday    . esomeprazole (NEXIUM) 40 MG capsule Take 40 mg by mouth at bedtime.    . Tamsulosin HCl (FLOMAX) 0.4 MG CAPS Take 0.4 mg by mouth at bedtime.      No current facility-administered medications for this visit.     Physical Exam: Vitals:   01/31/16 0828  BP: 110/72  Pulse: (!) 50  Weight: 176 lb 9.6 oz (80.1 kg)  Height: 5\' 8"  (1.727 m)    GEN- The patient is well appearing, alert and oriented x 3 today.   Head- normocephalic, atraumatic Eyes-  Sclera clear, conjunctiva pink Ears- hearing intact Oropharynx- clear Lungs- Clear to ausculation bilaterally, normal work of breathing Heart- Regular rate and rhythm, no murmurs, rubs or gallops, PMI not laterally displaced GI- soft,  NT, ND, + BS Extremities- no clubbing, cyanosis, or edema  ekg 01/27/16 is reviewed and reveals sinus bradycardia 48 bpm, PR 204 msec, with frequent sinus pauses of 2-3 seconds on rhythm strip  Assessment and Plan:  1. Sinus node dysfunction/ symptomatic bradycardia The patient has episodic palpitations which have been documented to be due to sinus pauses. There is pp prolongation prior to pause and is suggestive of increased vagal tone. Episodes have recenty worsened.  He denies presyncope or syncope. We had a long conversation today.  I have recommended PPM implantation at this time.  I am concerned that he may pass  out with an episode and cause himself injury.  I really do not think that he has any other options.  Risks, benefits, alternatives to pacemaker implantation were discussed in detail with the patient today. The patient understands that the risks include but are not limited to bleeding, infection, pneumothorax, perforation, tamponade, vascular damage, renal failure, MI, stroke, death,  and lead dislodgement and wishes to decline the procedure at this time.  He states that he has a wedding in September and is also planning a trip to [place].  He may be more willing to consider ppm down the road.  He will contact my office if he decides to proceed.  2. CAD No ischemic symptoms Continue current medicines  Return to see me in 4 months (he prefers to wait until then to return) Follow-up with Dr Burt Knack as scheduled  Thompson Grayer MD, [facility] 01/31/2016 9:17 AM

## 2016-01-31 NOTE — Patient Instructions (Signed)
Medication Instructions:  Your physician recommends that you continue on your current medications as directed. Please refer to the Current Medication list given to you today.   Labwork: None ordered   Testing/Procedures: None ordered   Follow-Up: Your physician recommends that you schedule a follow-up appointment in: 4 months with Dr. Allred   Any Other Special Instructions Will Be Listed Below (If Applicable).     If you need a refill on your cardiac medications before your next appointment, please call your pharmacy.   

## 2016-02-07 DIAGNOSIS — L570 Actinic keratosis: Secondary | ICD-10-CM | POA: Diagnosis not present

## 2016-02-07 DIAGNOSIS — D2372 Other benign neoplasm of skin of left lower limb, including hip: Secondary | ICD-10-CM | POA: Diagnosis not present

## 2016-02-07 DIAGNOSIS — L821 Other seborrheic keratosis: Secondary | ICD-10-CM | POA: Diagnosis not present

## 2016-02-22 ENCOUNTER — Encounter: Payer: Self-pay | Admitting: Sports Medicine

## 2016-02-22 ENCOUNTER — Ambulatory Visit (INDEPENDENT_AMBULATORY_CARE_PROVIDER_SITE_OTHER): Payer: Medicare Other | Admitting: Sports Medicine

## 2016-02-22 VITALS — BP 111/62 | HR 46 | Ht 68.0 in | Wt 170.0 lb

## 2016-02-22 DIAGNOSIS — M5137 Other intervertebral disc degeneration, lumbosacral region: Secondary | ICD-10-CM | POA: Diagnosis not present

## 2016-02-22 DIAGNOSIS — M79659 Pain in unspecified thigh: Secondary | ICD-10-CM

## 2016-02-22 DIAGNOSIS — M79606 Pain in leg, unspecified: Secondary | ICD-10-CM | POA: Diagnosis not present

## 2016-02-22 DIAGNOSIS — I25119 Atherosclerotic heart disease of native coronary artery with unspecified angina pectoris: Secondary | ICD-10-CM | POA: Diagnosis not present

## 2016-02-22 NOTE — Patient Instructions (Addendum)
For the next 6 weeks perform these daily  Low back stretches: -Knee to chest stretches: take 5 deep breaths with each stretch x5 on each leg -Cross stretch: x5 each side -Pelvic tilt: x5  Hamstring exercises: -Lower extremity extender: perform 4 sets of 8; (alternate each sided between each set) -Diver (lean forward on one foot) -Backwards lunge  Work on making your rowing seat as comfortable as possible.  If no improvement, we can consider adding Gabapentin to your treatment plan.

## 2016-02-22 NOTE — Progress Notes (Signed)
Subjective:    Patient ID: Nathaniel [name], male    DOB: [birth year], 70 y.o.   MRN: [id number]  HPI LARENCE RABE is a 70 y.o. male that presents to [facility] for bilateral hamstring pain/ cramping.  He reports that cramping first began in right hamstring while on a long flight about 5 years ago.  He reports that episode lasted about 30 minutes and was excruciating pain.  He reports that about 4-6 weeks ago, he was speed training (rowing), and felt some mild discomfort in his right calf.  He was able to complete his row.  He states that he did not have a debilitating cramp again until about 3 weeks ago, which occurred while sleeping.  He reports that again, cramp took about 30 minutes to resolve.  He notes that over the last 2 weeks things have gotten worse, as bilateral hamstrings feel tight and achy, and like he is having mini cramps.  He reports symptoms are worse with prolonged sitting, direct compression to hamstrings or with exaggerated flexion at the waist.  He has not rowed in a month because of fear that the cramps will recur and he will not be able to get to shore in time.  He reports some numbness along the lateral aspect of the right LE that has resolved.  Currently, he is performing home stretches for hamstring and has used a heating pad.  Heating pad has provided some relief but was temporary.    Of note, patient notes that he had an MRI of his back a couple of years ago for similar numbness in his RLE. Denies back pain, weakness, falls.  Past history Reviewed and documented in the chart Med he does have degenerative disc disease in the lumbar region documented on his prior MRI Plantar fasciitis and medial epicondylitis but we treated him for have both improved  Review of Systems Per HPI. No weakness or giving out of muscles in the lower extremity Denies true sciatica Does have a history of some sensory change in bilateral lower extremities that is intermittent History of cramps in  other muscles including his adductors and calves    Objective:   Physical Exam Gen: well appearing male, NAD MSK: normal gait, normal tonicity of hamstrings, no point TTP to the hamstring, 4/5 hamstring strength bilaterally (though patient admits that this exam may be confounded by fear of cramping), H test remarkable for about a 10 degree difference in hip flexion between Left and Right (Left with great ROM than R). Neuro: normal light touch sensation bilaterally Skin: notable for varicose veins bilaterally.  Dynamic testing - able to do an extender and a diver test for the hamstrings     Assessment & Plan:   1. Thigh pain, musculoskeletal, bilateral.  I suspect that this is neurogenic in nature given bilaterality.  Additionally, given patient's history and timing of symptoms this seems more likely to be secondary to DDD and nerve irritation.  No focal neurologic deficits on today's exam.  No atrophy of the hamstrings appreciated.   - HEP for hamstrings reviewed.  Patient to perform extender, diver and backwards lunge daily  2. DDD (degenerative disc disease), lumbosacral.  2015 MRI lumbar spine report and imaging study personally reviewed.  Study showed notable lumbosacral DDD at L4-L5 and L5-S1.  Abnormalities also appreciated in L1-L2.  This would correlate with irritation of the nerves that innervate the hamstrings and is more likely the cause of his symptoms.  Discussed adding Neurontin to  therapy plan.  However, patient wishes to proceed with stretches and HEP first. - HEP for low back reviewed.  Patient to perform knee to chest, cross stretch and pelvic tilt exercises. - Consider adding Neurontin if no improvement - Follow up in 6 weeks.  Zamyra Allensworth M. Lajuana Ripple, DO PGY-3, [facility]

## 2016-03-13 DIAGNOSIS — K589 Irritable bowel syndrome without diarrhea: Secondary | ICD-10-CM | POA: Diagnosis not present

## 2016-03-20 DIAGNOSIS — H6123 Impacted cerumen, bilateral: Secondary | ICD-10-CM | POA: Diagnosis not present

## 2016-03-20 DIAGNOSIS — H903 Sensorineural hearing loss, bilateral: Secondary | ICD-10-CM | POA: Diagnosis not present

## 2016-03-21 DIAGNOSIS — L821 Other seborrheic keratosis: Secondary | ICD-10-CM | POA: Diagnosis not present

## 2016-03-21 DIAGNOSIS — L570 Actinic keratosis: Secondary | ICD-10-CM | POA: Diagnosis not present

## 2016-04-06 ENCOUNTER — Ambulatory Visit: Payer: Medicare Other | Admitting: Cardiovascular Disease

## 2016-04-06 ENCOUNTER — Encounter: Payer: Self-pay | Admitting: Cardiovascular Disease

## 2016-04-20 ENCOUNTER — Ambulatory Visit: Payer: Medicare Other | Admitting: Sports Medicine

## 2016-04-21 ENCOUNTER — Encounter: Payer: Self-pay | Admitting: Cardiovascular Disease

## 2016-04-21 ENCOUNTER — Ambulatory Visit (INDEPENDENT_AMBULATORY_CARE_PROVIDER_SITE_OTHER): Payer: Medicare Other | Admitting: Cardiovascular Disease

## 2016-04-21 VITALS — BP 120/60 | HR 52 | Ht 68.0 in | Wt 179.6 lb

## 2016-04-21 DIAGNOSIS — I25119 Atherosclerotic heart disease of native coronary artery with unspecified angina pectoris: Secondary | ICD-10-CM

## 2016-04-21 DIAGNOSIS — I2510 Atherosclerotic heart disease of native coronary artery without angina pectoris: Secondary | ICD-10-CM

## 2016-04-21 NOTE — Patient Instructions (Signed)
Medication Instructions:  Your physician recommends that you continue on your current medications as directed. Please refer to the Current Medication list given to you today.   Labwork: None Ordered   Testing/Procedures: Your physician has requested that you have an exercise tolerance test in 1 year prior to your office visit. For further information please visit [email or web address]. Please also follow instruction sheet, as given.    Follow-Up: Your physician wants you to follow-up in: 1 year with  Dr. Burt Knack.  You will receive a reminder letter in the mail two months in advance. If you don't receive a letter, please call our office to schedule the follow-up appointment.   If you need a refill on your cardiac medications before your next appointment, please call your pharmacy.   Thank you for choosing [facility]! Christen Bame, RN [phone]

## 2016-04-21 NOTE — Progress Notes (Signed)
Cardiology Office Note Date:  04/21/2016   ID:  Nathaniel [name], DOB 1946/03/08, MRN [id number]  PCP:  Mathews Argyle, MD  Cardiologist:  Sherren Mocha, MD    Chief Complaint  Patient presents with  . Coronary Artery Disease     History of Present Illness: Nathaniel [name] is a 70 y.o. male who presents for follow-up evaluation. The patient has been followed for microvascular angina, history of DVT/PE, symptomatic bradycardia, and hyperlipidemia.  He has a high vagal tone but persistently has had symptoms of septum and bradycardia. He has been followed by Dr. Rayann Heman. Permanent pacemaker was discussed, but the patient elected to continue with observation. He continues to have lightheadedness on occasion, but overall feels he is a little better than he was 6 months ago. No syncope. His exertional chest discomfort which occurs intermittently is stable without change. He can use a rowing machine with no symptoms. Typically chest pressure occurs only with walking at a brisk pace or and cool weather. No shortness of breath, edema, or heart palpitations. No changes in his medical program since he was last seen.   Past Medical History:  Diagnosis Date  . Allergic rhinitis   . Anemia    pt unaware  . Anginal pain ([place])    Microvascular angina- followed by Dr Burt Knack  . Arthritis    back  . Cancer (HCC)    Basal cell -   . Coronary artery disease    nonobstructive  . DVT (deep venous thrombosis) ([place]) 03/2011   after prolonged travel  . GERD (gastroesophageal reflux disease)   . Headache    Vision Migraines  . Hiatal hernia    small  . History of rheumatic fever    questionable  . Hyperlipidemia   . Increased prostate specific antigen (PSA) velocity   . Laryngopharyngeal reflux   . Mitral valve prolapse   . Palpitations   . PE (pulmonary embolism) 03/2011   after prolonged travel  . PONV (postoperative nausea and vomiting)    Fentyl- N/V  . Pruritus    resolved    . Shortness of breath dyspnea    with Exertion  . Sinus bradycardia     Past Surgical History:  Procedure Laterality Date  . COLONOSCOPY    . COLONOSCOPY N/A 02/24/2015   Procedure: COLONOSCOPY;  Surgeon: Ronald Lobo, MD;  Location[facility] END[facility]COPY;  Service: Endoscopy;  Laterality: N/A;  . CYSTOSCOPY    . ESOPHAGOGASTRODUODENOSCOPY N/A 02/06/2013   Procedure: ESOPHAGOGASTRODUODENOSCOPY (EGD);  Surgeon: Cleotis Nipper, MD;  Location: [facility] ENDOSCOPY;  Service: Endoscopy;  Laterality: N/A;  . ESOPHAGOGASTRODUODENOSCOPY N/A 02/24/2015   Procedure: ESOPHAGOGASTRODUODENOSCOPY (EGD);  Surgeon: Ronald Lobo, MD;  Location: [facility] ENDOSCOPY;  Service: Endoscopy;  Laterality: N/A;  . HUMERUS FRACTURE SURGERY Right 05/2003  . Repair of an AC seperation of the left    . SHOULDER ARTHROSCOPY Left    A/C tear  . TONSILLECTOMY AND ADENOIDECTOMY      Current Outpatient Prescriptions  Medication Sig Dispense Refill  . atorvastatin (LIPITOR) 20 MG tablet Take 1 tablet (20 mg total) by mouth daily. 90 tablet 3  . cholecalciferol (VITAMIN D) 1000 UNITS tablet Take 1,000 Units by mouth every morning.     . Cyanocobalamin (VITAMIN B-12) 2500 MCG SUBL Place 1 tablet under the tongue every 7 (seven) days. Takes on Wednesday    . esomeprazole (NEXIUM) 40 MG capsule Take 40 mg by mouth at bedtime.    . Tamsulosin HCl (FLOMAX) 0.4  MG CAPS Take 0.4 mg by mouth at bedtime.      No current facility-administered medications for this visit.     Allergies:   Penicillins; Fentanyl; and Imdur [isosorbide dinitrate]   Social History:  The patient  reports that he has never smoked. He has never used smokeless tobacco. He reports that he drinks alcohol. He reports that he does not use drugs.   Family History:  The patient's  family history is not on file.    ROS:  Please see the history of present illness.  Otherwise, review of systems is positive for Chest pain, heart palpitations.  All other systems are reviewed  and negative.    PHYSICAL EXAM: VS:  BP 120/60   Pulse (!) 52   Ht 5\' 8"  (1.727 m)   Wt 81.5 kg (179 lb 9.6 oz)   BMI 27.31 kg/m  , BMI Body mass index is 27.31 kg/m. GEN: Well nourished, well developed, in no acute distress  HEENT: normal  Neck: no JVD, no masses. No carotid bruits Cardiac: RRR without murmur or gallop                Respiratory:  clear to auscultation bilaterally, normal work of breathing GI: soft, nontender, nondistended, + BS MS: no deformity or atrophy  Ext: no pretibial edema, pedal pulses 2+= bilaterally Skin: warm and dry, no rash Neuro:  Strength and sensation are intact Psych: euthymic mood, full affect  EKG:  EKG is not ordered today.  Recent Labs: 08/30/2015: TSH 1.45   Lipid Panel     Component Value Date/Time   CHOL 124 05/15/2013 0926   TRIG 77.0 05/15/2013 0926   HDL 34.70 (L) 05/15/2013 0926   CHOLHDL 4 05/15/2013 0926   VLDL 15.4 05/15/2013 0926   LDLCALC 74 05/15/2013 0926      Wt Readings from Last 3 Encounters:  04/21/16 81.5 kg (179 lb 9.6 oz)  02/22/16 77.1 kg (170 lb)  01/31/16 80.1 kg (176 lb 9.6 oz)     Cardiac Studies Reviewed: Cardiopulmonary exercise test 09/14/2014: Conclusion: Normal cardiopulmonary exercise test with no cardiopulmonary limitations to exercise. At peak exercise there may be mild hypoventilation (respiratory rate only 37 breaths/min) however other parameters are normal and functional capacity exceeds predicted.   Echo 08/05/2015: Study Conclusions  - Left ventricle: The cavity size was mildly dilated. There was   mild concentric hypertrophy. Systolic function was normal. The   estimated ejection fraction was in the range of 55% to 60%. Wall   motion was normal; there were no regional wall motion   abnormalities. - Aortic valve: There was trivial regurgitation. - Mitral valve: Flat closure no frank prolapse. There was mild   regurgitation. - Atrial septum: No defect or patent foramen ovale was  identified.  Transthoracic echocardiography.  M-mode, complete 2D, spectral Doppler, and color Doppler.  Birthdate:  Patient birthdate: 13-Jan-1946.  Age:  Patient is 70 yr old.  Sex:  Gender: male. BMI: 26.7 kg/m^2.  Blood pressure:     122/70  Patient status: Outpatient.  Study date:  Study date: 08/05/2015. Study time: 02:15 PM.  Location:  [place] [facility]  -------------------------------------------------------------------  ------------------------------------------------------------------- Left ventricle:  The cavity size was mildly dilated. There was mild concentric hypertrophy. Systolic function was normal. The estimated ejection fraction was in the range of 55% to 60%. Wall motion was normal; there were no regional wall motion abnormalities.  ------------------------------------------------------------------- Aortic valve:   Trileaflet; mildly thickened leaflets. Mobility was not  restricted.  Doppler:  Transvalvular velocity was within the normal range. There was no stenosis. There was trivial regurgitation.  ------------------------------------------------------------------- Aorta:  Aortic root: The aortic root was normal in size.  ------------------------------------------------------------------- Mitral valve:  Flat closure no frank prolapse.  Structurally normal valve.   Mobility was not restricted.  Doppler:  Transvalvular velocity was within the normal range. There was no evidence for stenosis. There was mild regurgitation.  ------------------------------------------------------------------- Left atrium:  The atrium was normal in size.  ------------------------------------------------------------------- Atrial septum:  No defect or patent foramen ovale was identified.   ------------------------------------------------------------------- Right ventricle:  The cavity size was normal. Wall thickness was normal. Systolic function was  normal.  ------------------------------------------------------------------- Pulmonic valve:    Doppler:  Transvalvular velocity was within the normal range. There was no evidence for stenosis.  ------------------------------------------------------------------- Tricuspid valve:   Structurally normal valve.    Doppler: Transvalvular velocity was within the normal range. There was trivial regurgitation.  ------------------------------------------------------------------- Pulmonary artery:   The main pulmonary artery was normal-sized. Systolic pressure was within the normal range.  ------------------------------------------------------------------- Right atrium:  The atrium was normal in size.  ------------------------------------------------------------------- Pericardium:  The pericardium was normal in appearance. There was no pericardial effusion.  ------------------------------------------------------------------- Systemic veins: Inferior vena cava: The vessel was normal in size. The respirophasic diameter changes were in the normal range (= 50%), consistent with normal central venous pressure. Diameter: 17 mm.  Holter 08/16/2015: Study Highlights   The basic rhythm is sinus with periods of sinus bradycardia and sinus tachycardia There are sinus pauses approaching 3 seconds No evidence of AV block or tachyarrhythmia Occasional PAC's/PVC's  EP evaluation pending    ASSESSMENT AND PLAN: 1.  Symptomatically bradycardiac: Minimal symptoms at present. Continue with observation. He will return for follow-up in one year.  2. Nonobstructive coronary artery disease with probable microvascular angina: Stable symptoms. Recommended an exercise treadmill study next year.  3. Hyperlipidemia: Treated with atorvastatin. Will obtain labs from Dr. Felipa Eth.  4. History of mitral valve prolapse and mitral regurgitation: No murmur on exam. Most recent echo reviewed with flat closure  of the mitral valve but no definitive prolapse. Continue observation.  Current medicines are reviewed with the patient today.  The patient does not have concerns regarding medicines.  Labs/ tests ordered today include:  No orders of the defined types were placed in this encounter.   Disposition:   FU one year  Signed, Sherren Mocha, MD  04/21/2016 11:01 AM    [facility] [place], [place], [place]  [address] Phone: [phone]; Fax: [phone]

## 2016-05-01 DIAGNOSIS — Z23 Encounter for immunization: Secondary | ICD-10-CM | POA: Diagnosis not present

## 2016-05-22 DIAGNOSIS — L821 Other seborrheic keratosis: Secondary | ICD-10-CM | POA: Diagnosis not present

## 2016-05-22 DIAGNOSIS — L820 Inflamed seborrheic keratosis: Secondary | ICD-10-CM | POA: Diagnosis not present

## 2016-05-29 ENCOUNTER — Ambulatory Visit: Payer: Medicare Other | Admitting: Internal Medicine

## 2016-06-09 DIAGNOSIS — N411 Chronic prostatitis: Secondary | ICD-10-CM | POA: Diagnosis not present

## 2016-06-09 DIAGNOSIS — N401 Enlarged prostate with lower urinary tract symptoms: Secondary | ICD-10-CM | POA: Diagnosis not present

## 2016-06-09 DIAGNOSIS — N138 Other obstructive and reflux uropathy: Secondary | ICD-10-CM | POA: Diagnosis not present

## 2016-06-09 DIAGNOSIS — R972 Elevated prostate specific antigen [PSA]: Secondary | ICD-10-CM | POA: Diagnosis not present

## 2016-06-22 ENCOUNTER — Ambulatory Visit (INDEPENDENT_AMBULATORY_CARE_PROVIDER_SITE_OTHER): Payer: Medicare Other | Admitting: Sports Medicine

## 2016-06-22 ENCOUNTER — Ambulatory Visit: Payer: Self-pay

## 2016-06-22 ENCOUNTER — Encounter: Payer: Self-pay | Admitting: Sports Medicine

## 2016-06-22 VITALS — BP 124/72 | Ht 68.0 in | Wt 175.0 lb

## 2016-06-22 DIAGNOSIS — S76301D Unspecified injury of muscle, fascia and tendon of the posterior muscle group at thigh level, right thigh, subsequent encounter: Secondary | ICD-10-CM | POA: Diagnosis present

## 2016-06-22 DIAGNOSIS — S76319D Strain of muscle, fascia and tendon of the posterior muscle group at thigh level, unspecified thigh, subsequent encounter: Secondary | ICD-10-CM | POA: Insufficient documentation

## 2016-06-22 DIAGNOSIS — S76302S Unspecified injury of muscle, fascia and tendon of the posterior muscle group at thigh level, left thigh, sequela: Secondary | ICD-10-CM

## 2016-06-22 NOTE — Assessment & Plan Note (Signed)
See overview  With no worrisome findings on [place] we will refer for PT

## 2016-06-22 NOTE — Progress Notes (Signed)
RT Hamstring Pain  Patient first hurt Hamstring 6 mos ago This was slow to heal and I felt likely some component of lumbar DDD Pain is less Radiation now is primarily to RT leg Tightness and tingling but not severe sciatic sxs He has done extender and diver exercises No pain w rowing machine  ROS No current LBP No weakness in foot or lower RT leg  PE Fit older M in NAD BP 124/72   Ht 5\' 8"  (1.727 m)   Wt 175 lb (79.4 kg)   BMI 26.61 kg/m   H test is negative Strength testing of Hamstring RT and left is normal Tested in 3 positions Diver exercise causes tightness Extender is pain free No defect noted Mild deep TTP Mid to upper 1/3  Ultrasound of right hamstring High hamstring looks normal SM/ST and BF AT intersection of MM bellies in upper portion of middle third there is an area of disrupted fibers Seen best short axis Slight increase in hypoechoic change long axis No acute findings No tears  Impression:  Hamstring shows one area of fiber disruption at intersection of biceps femoris and SM/ST compatible with remote tear  Ultrasound and interpretation by Peterson Ao B. Oneida Alar, MD

## 2016-07-03 ENCOUNTER — Ambulatory Visit: Payer: Medicare Other | Admitting: Internal Medicine

## 2016-07-10 DIAGNOSIS — S76311D Strain of muscle, fascia and tendon of the posterior muscle group at thigh level, right thigh, subsequent encounter: Secondary | ICD-10-CM | POA: Diagnosis not present

## 2016-07-12 DIAGNOSIS — S76311D Strain of muscle, fascia and tendon of the posterior muscle group at thigh level, right thigh, subsequent encounter: Secondary | ICD-10-CM | POA: Diagnosis not present

## 2016-07-18 DIAGNOSIS — S76311D Strain of muscle, fascia and tendon of the posterior muscle group at thigh level, right thigh, subsequent encounter: Secondary | ICD-10-CM | POA: Diagnosis not present

## 2016-07-20 DIAGNOSIS — S76311D Strain of muscle, fascia and tendon of the posterior muscle group at thigh level, right thigh, subsequent encounter: Secondary | ICD-10-CM | POA: Diagnosis not present

## 2016-07-26 DIAGNOSIS — S76311D Strain of muscle, fascia and tendon of the posterior muscle group at thigh level, right thigh, subsequent encounter: Secondary | ICD-10-CM | POA: Diagnosis not present

## 2016-08-03 DIAGNOSIS — S76311D Strain of muscle, fascia and tendon of the posterior muscle group at thigh level, right thigh, subsequent encounter: Secondary | ICD-10-CM | POA: Diagnosis not present

## 2016-08-08 DIAGNOSIS — L249 Irritant contact dermatitis, unspecified cause: Secondary | ICD-10-CM | POA: Diagnosis not present

## 2016-08-10 DIAGNOSIS — S76311D Strain of muscle, fascia and tendon of the posterior muscle group at thigh level, right thigh, subsequent encounter: Secondary | ICD-10-CM | POA: Diagnosis not present

## 2016-08-13 ENCOUNTER — Other Ambulatory Visit: Payer: Self-pay | Admitting: Cardiovascular Disease

## 2016-08-13 DIAGNOSIS — E785 Hyperlipidemia, unspecified: Secondary | ICD-10-CM

## 2016-08-18 ENCOUNTER — Other Ambulatory Visit: Payer: Self-pay | Admitting: *Deleted

## 2016-08-18 ENCOUNTER — Encounter: Payer: Self-pay | Admitting: Family Medicine

## 2016-08-18 ENCOUNTER — Ambulatory Visit (INDEPENDENT_AMBULATORY_CARE_PROVIDER_SITE_OTHER): Payer: Medicare Other | Admitting: Family Medicine

## 2016-08-18 VITALS — BP 117/69 | HR 47 | Ht 68.0 in | Wt 175.0 lb

## 2016-08-18 DIAGNOSIS — S76319D Strain of muscle, fascia and tendon of the posterior muscle group at thigh level, unspecified thigh, subsequent encounter: Secondary | ICD-10-CM | POA: Diagnosis present

## 2016-08-18 MED ORDER — NITROGLYCERIN 0.2 MG/HR TD PT24: MEDICATED_PATCH | TRANSDERMAL | 1 refills | Status: DC

## 2016-08-18 NOTE — Patient Instructions (Signed)
Cut patch into one - fourth pieces Place a one fourth piece of patch on  skin over affected area, changing to a new piece every 24 hours.   You may experience a headache during the first 1-2 days and maybe up to 2 weeks of using the patch; these should improve and go away. If you experience headaches after beginning nitroglycerin patch treatment, you may take your preferred over the counter pain medicine such as tylenol or advil as directed on the box. Another side effect of the nitroglycerin patch can be skin irritation  or rash related to the patch adhesive.   Please notify our office if you develop  severe headaches or rash, and stop the patch.  Please call our office with any questions or problems. Tendon healing with nitroglycerin patch may require 12 to 24 weeks depending on the extent of injury. Men should not use if taking Viagra, Cialis, or Levitra as it may cause an unsafe lowering of the blood pressure..  Use with caution if you have migraines or rosacea.  Please schedule an appointment to see one of [place] back in 2-3 weeks. Great to meet you!

## 2016-08-21 NOTE — Assessment & Plan Note (Signed)
Long discussion being greater than 50% of our 30 minute office visit in counseling and education regarding issues of treatment.  He has completed several sessions of formal physical therapy without much improvement.  I wonder how aggressively he was able to do this since he was so concerned about actively resisting strengths testing today during my exam.  He was worried he would get a hamstring cramp.  We talked about options.  I do think this is a focal problem rather than a back problem although there may be some contribution from the back.  I would like to start him on nitroglycerin therapy.  We have discussed that in detail.  He'll follow-up with me or with Dr. Oneida Alar in 2-3 weeks.  Coronary history ai noted.

## 2016-08-21 NOTE — Progress Notes (Signed)
Nathaniel [name] - 71 y.o. male MRN [id number]  Date of birth: 07-03-1945    SUBJECTIVE:      Chief Complaint:/ HPI:   Right hamstring pain.  About 5 years ago he had some type of right hamstring injury and has had problems off and on since then.  He saw Dr. Oneida Alar In January with recurrence of hamstring problems bilaterally but more on the right occurring in the last 6 months.  He has been through physical therapy and has not had much improvement.  Still has pain when he sits down for long periods of time but this does not radiate below the knee.  If he tries to exercise that muscle he will occasionally get severe cramps.  These can last up to 30 minutes and are 10 out of 10 on the pain scale.  His favorite activity is rowing and he does not have pain with this.  He is quite frustrated with the length of time he has had still with issues.  At one point he has some similar problems in the left hamstring but that seems to resolved on its own.   ROS:     No unusual weight change, no fever, sweats or chills.  He is not having any specific low back pain.  The hamstring pain on the right does not radiate up into the buttock or below the knee.  He's had no bowel or bladder incontinence.  He's had no weakness in the right lower leg ankle or foot.  PERTINENT  PMH / PSH FH / / SH:  Past Medical, Surgical, Social, and Family History Reviewed & Updated in the EMR.  Pertinent findings include:  Mitral valve prolapse History of pulmonary embolism Sick sinus syndrome Aortic insufficiency Coronary artery disease native vessel and excellent hyperlipidemia Remote history of migraine headaches History of palpitations Surgical history significant for left acromioclavicular joint repair and shoulder arthroscopy He is a never smoker. Family history noncontributory   OBJECTIVE: BP 117/69   Pulse (!) 47   Ht 5\' 8"  (1.727 m)   Wt 175 lb (79.4 kg)   BMI 26.61 kg/m   Physical Exam:  Vital signs are  reviewed. GENERAL: Well-developed male no acute distress BACK: Nontender to palpation.  No defect is noted.  Vertebral bodies are nontender to percussion.  He has normal flexion to 90 at the hips with normal hyperextension is painless.  Normal lateral rotation is painless. HIPS: Internal/external rotation is painless and full.  Hip flexion and extension strength 5 out of 5. KNEES: The flexion and extension 5 out of 5 symmetrical. THIGHS: Mild diffuse tenderness to palpation in the right posterior mid thigh.  There is no defect noted here.  I cannot pinpoint an exact area, there is diffuse generalized tenderness in about a 5 cm diameter area.  The left hamstring is nontender to palpation.  Both hamstrings.  Normal muscle bulk and tone.  Hamstring strength is 5 out of 5 bilaterally although I had to encourage him to fully actively resist for testing because he was afraid he would get muscle cramping. VASCULAR: Posterior tibialis and dorsalis pedis pulses are 2+ bilaterally equal NEURO: Straight leg raise negative bilaterally.  Intact sensation and soft touch bilateral feet.  IMAGING: MRI of lumbar spine October 2015: Mild lumbar spondylosis.  Mild symmetric foraminal stenosis L4-5. MSK ultrasound right hamstring January 2018 reveals area of 5 or disruption compatible with remote tear at the intersection the biceps and warrants and semimembranosus/semitendinosus.  ASSESSMENT & PLAN:  See problem based charting & AVS for pt instructions.

## 2016-09-15 ENCOUNTER — Ambulatory Visit (INDEPENDENT_AMBULATORY_CARE_PROVIDER_SITE_OTHER): Payer: Medicare Other | Admitting: Family Medicine

## 2016-09-15 ENCOUNTER — Encounter: Payer: Self-pay | Admitting: Family Medicine

## 2016-09-15 DIAGNOSIS — S76319D Strain of muscle, fascia and tendon of the posterior muscle group at thigh level, unspecified thigh, subsequent encounter: Secondary | ICD-10-CM | POA: Diagnosis not present

## 2016-09-15 NOTE — Assessment & Plan Note (Signed)
Is unable to tolerate nitroglycerin therapy. Long discussion with him today. I really think he is experiencing symptoms from previous scar formation more than anything else. I think the only way to get rid of that is due some aggressive rehabilitation. He has been somewhat frightened of this because when he has muscle cramps they are evidently quite severe. I discussed this at  length spending greater than 50% of our 25 minute office visit in counseling & education regarding these issues. I recommended starting with low tensile strength Thera-Band, low repetitions and gradually working up to moderate theraband and with high repetitions.Stretch gently and ice prn after exercise. He'll follow-up when necessary.

## 2016-09-15 NOTE — Progress Notes (Signed)
    CHIEF COMPLAINT / HPI:   Follow-up right hamstring pain At last office visit he started him on nitroglycerin patch therapy. He was unable to tolerate that. He used it for about 4 days and said the headaches were just overwhelming. He actually had 2 or 3 days when the leg seem better but today it is back to its baseline. He notes he has improvement in pain if he puts pressure on the area. He is quite frustrated.  REVIEW OF SYSTEMS:  No unusual weight change, no fever, no lower extremity numbness or tingling.  OBJECTIVE:  Vital signs are reviewed.   GEN.: Well-developed male no acute distress   GAIT: Normal. MSK: Mild tenderness to palpation in the mid muscle belly area of the right posterior thigh. There is no palpable nodule. Dose hamstrings have normal muscle bulk and tone.  I reviewed his report from physical therapy which indicated they did not think he had any type of hamstring injury. I reviewed the notes from previous sports medicine appointments including the imaging done by Dr. Oneida Alar.  ASSESSMENT / PLAN: Please see problem oriented charting for details

## 2016-09-15 NOTE — Patient Instructions (Signed)
I would recommend a graduated strengthening program for  Your hamstring. Start with the lightest theraband  (yeallow) and do 5-10 hamstring curls 2 times a day. Ok to GENTLY stretch or ice afterward.  If you are having a lof of cramping, back down on number of repetitions.  Gradually work up to 30-40 repetitions and when you can do that, switch out to the heavier theraband (green)  and start back with a lower number of repetitions. Ultimate goal would be 30-50 repetitions with the green therband once or twice a day.  Great to see you!

## 2016-10-10 DIAGNOSIS — N481 Balanitis: Secondary | ICD-10-CM | POA: Diagnosis not present

## 2016-10-16 DIAGNOSIS — R972 Elevated prostate specific antigen [PSA]: Secondary | ICD-10-CM | POA: Diagnosis not present

## 2016-10-16 DIAGNOSIS — N411 Chronic prostatitis: Secondary | ICD-10-CM | POA: Diagnosis not present

## 2016-10-16 DIAGNOSIS — N401 Enlarged prostate with lower urinary tract symptoms: Secondary | ICD-10-CM | POA: Diagnosis not present

## 2016-10-16 DIAGNOSIS — N138 Other obstructive and reflux uropathy: Secondary | ICD-10-CM | POA: Diagnosis not present

## 2016-12-11 DIAGNOSIS — E7800 Pure hypercholesterolemia, unspecified: Secondary | ICD-10-CM | POA: Diagnosis not present

## 2016-12-11 DIAGNOSIS — K219 Gastro-esophageal reflux disease without esophagitis: Secondary | ICD-10-CM | POA: Diagnosis not present

## 2016-12-11 DIAGNOSIS — Z0000 Encounter for general adult medical examination without abnormal findings: Secondary | ICD-10-CM | POA: Diagnosis not present

## 2016-12-11 DIAGNOSIS — Z79899 Other long term (current) drug therapy: Secondary | ICD-10-CM | POA: Diagnosis not present

## 2016-12-11 DIAGNOSIS — N183 Chronic kidney disease, stage 3 (moderate): Secondary | ICD-10-CM | POA: Diagnosis not present

## 2016-12-11 DIAGNOSIS — Z1389 Encounter for screening for other disorder: Secondary | ICD-10-CM | POA: Diagnosis not present

## 2016-12-11 DIAGNOSIS — Z23 Encounter for immunization: Secondary | ICD-10-CM | POA: Diagnosis not present

## 2016-12-25 DIAGNOSIS — E7800 Pure hypercholesterolemia, unspecified: Secondary | ICD-10-CM | POA: Diagnosis not present

## 2016-12-25 DIAGNOSIS — Z79899 Other long term (current) drug therapy: Secondary | ICD-10-CM | POA: Diagnosis not present

## 2017-01-02 DIAGNOSIS — H2513 Age-related nuclear cataract, bilateral: Secondary | ICD-10-CM | POA: Diagnosis not present

## 2017-01-02 DIAGNOSIS — H524 Presbyopia: Secondary | ICD-10-CM | POA: Diagnosis not present

## 2017-02-06 DIAGNOSIS — L298 Other pruritus: Secondary | ICD-10-CM | POA: Diagnosis not present

## 2017-02-06 DIAGNOSIS — L821 Other seborrheic keratosis: Secondary | ICD-10-CM | POA: Diagnosis not present

## 2017-02-06 DIAGNOSIS — L820 Inflamed seborrheic keratosis: Secondary | ICD-10-CM | POA: Diagnosis not present

## 2017-04-30 DIAGNOSIS — Z23 Encounter for immunization: Secondary | ICD-10-CM | POA: Diagnosis not present

## 2017-05-09 ENCOUNTER — Ambulatory Visit (INDEPENDENT_AMBULATORY_CARE_PROVIDER_SITE_OTHER): Payer: Medicare Other

## 2017-05-09 DIAGNOSIS — I2510 Atherosclerotic heart disease of native coronary artery without angina pectoris: Secondary | ICD-10-CM | POA: Diagnosis not present

## 2017-05-09 LAB — EXERCISE TOLERANCE TEST
CHL CUP MPHR: 148 {beats}/min
CSEPED: 7 min
CSEPEDS: 28 s
CSEPHR: 87 %
Estimated workload: 9.2 METS
Peak HR: 129 {beats}/min
RPE: 18
Rest HR: 52 {beats}/min

## 2017-05-13 ENCOUNTER — Other Ambulatory Visit: Payer: Self-pay | Admitting: Cardiovascular Disease

## 2017-05-13 DIAGNOSIS — E785 Hyperlipidemia, unspecified: Secondary | ICD-10-CM

## 2017-05-16 ENCOUNTER — Encounter: Payer: Self-pay | Admitting: Cardiovascular Disease

## 2017-05-16 ENCOUNTER — Ambulatory Visit (INDEPENDENT_AMBULATORY_CARE_PROVIDER_SITE_OTHER): Payer: Medicare Other | Admitting: Cardiovascular Disease

## 2017-05-16 VITALS — BP 126/70 | HR 48 | Ht 68.0 in | Wt 182.4 lb

## 2017-05-16 DIAGNOSIS — I2510 Atherosclerotic heart disease of native coronary artery without angina pectoris: Secondary | ICD-10-CM | POA: Diagnosis not present

## 2017-05-16 DIAGNOSIS — I25118 Atherosclerotic heart disease of native coronary artery with other forms of angina pectoris: Secondary | ICD-10-CM

## 2017-05-16 DIAGNOSIS — I209 Angina pectoris, unspecified: Secondary | ICD-10-CM | POA: Diagnosis not present

## 2017-05-16 LAB — BASIC METABOLIC PANEL
BUN/Creatinine Ratio: 17 (ref 10–24)
BUN: 20 mg/dL (ref 8–27)
CO2: 25 mmol/L (ref 20–29)
Calcium: 9.4 mg/dL (ref 8.6–10.2)
Chloride: 103 mmol/L (ref 96–106)
Creatinine, Ser: 1.21 mg/dL (ref 0.76–1.27)
GFR calc Af Amer: 69 mL/min/{1.73_m2} (ref >59)
GFR, EST NON AFRICAN AMERICAN: 59 mL/min/{1.73_m2} — AB (ref >59)
Glucose: 99 mg/dL (ref 65–99)
POTASSIUM: 4.6 mmol/L (ref 3.5–5.2)
Sodium: 140 mmol/L (ref 134–144)

## 2017-05-16 NOTE — Progress Notes (Signed)
Cardiology Office Note Date:  05/16/2017   ID:  HRITHIK [name], DOB [birth year], MRN [id number]  PCP:  Lajean Manes, MD  Cardiologist:  Sherren Mocha, MD    Chief Complaint  Patient presents with  . Chest Pain    History of Present Illness: Nathaniel [name] is a 71 y.o. male who presents for follow-up evaluation.  The patient is followed for nonobstructive coronary artery disease and chronic microvascular angina.  He also has had symptomatic bradycardia and has been seen by Dr. Rayann Heman.  He has not required permanent pacing.  He has had most problems with palpitations and bradycardia following exercise.  He has mild limitation with exercise because of exertional angina with his anginal threshold stable at this time.  He is able to row without symptoms.  However, he experiences angina with moderate level activity such as climbing stairs or walking on the treadmill for exercise.  Symptoms resolve with rest and have not intensified recently.  He has no resting chest pain.  He denies orthopnea or PND.  He does experience shortness of breath with activity.   Past Medical History:  Diagnosis Date  . Allergic rhinitis   . Anemia    pt unaware  . Anginal pain ([place])    Microvascular angina- followed by Dr Burt Knack  . Arthritis    back  . Cancer (HCC)    Basal cell -   . Coronary artery disease    nonobstructive  . DVT (deep venous thrombosis) ([place]) 03/2011   after prolonged travel  . GERD (gastroesophageal reflux disease)   . Headache    Vision Migraines  . Hiatal hernia    small  . History of rheumatic fever    questionable  . Hyperlipidemia   . Increased prostate specific antigen (PSA) velocity   . Laryngopharyngeal reflux   . Mitral valve prolapse   . Palpitations   . PE (pulmonary embolism) 03/2011   after prolonged travel  . PONV (postoperative nausea and vomiting)    Fentyl- N/V  . Pruritus    resolved  . Shortness of breath dyspnea    with Exertion  . Sinus  bradycardia     Past Surgical History:  Procedure Laterality Date  . COLONOSCOPY    . COLONOSCOPY N/A 02/24/2015   Procedure: COLONOSCOPY;  Surgeon: Ronald Lobo, MD;  Location: [facility] ENDOSCOPY;  Service: Endoscopy;  Laterality: N/A;  . CYSTOSCOPY    . ESOPHAGOGASTRODUODENOSCOPY N/A 02/06/2013   Procedure: ESOPHAGOGASTRODUODENOSCOPY (EGD);  Surgeon: Cleotis Nipper, MD;  Location: [facility] ENDOSCOPY;  Service: Endoscopy;  Laterality: N/A;  . ESOPHAGOGASTRODUODENOSCOPY N/A 02/24/2015   Procedure: ESOPHAGOGASTRODUODENOSCOPY (EGD);  Surgeon: Ronald Lobo, MD;  Location: [facility] ENDOSCOPY;  Service: Endoscopy;  Laterality: N/A;  . HUMERUS FRACTURE SURGERY Right 05/2003  . Repair of an AC seperation of the left    . SHOULDER ARTHROSCOPY Left    A/C tear  . TONSILLECTOMY AND ADENOIDECTOMY      Current Outpatient Medications  Medication Sig Dispense Refill  . atorvastatin (LIPITOR) 20 MG tablet TAKE 1 TABLET(20 MG) BY MOUTH DAILY 90 tablet 0  . cholecalciferol (VITAMIN D) 1000 UNITS tablet Take 1,000 Units by mouth every morning.     . Cyanocobalamin (VITAMIN B-12) 2500 MCG SUBL Place 1 tablet under the tongue every 7 (seven) days. Takes on Wednesday    . esomeprazole (NEXIUM) 40 MG capsule Take 40 mg by mouth at bedtime.    . Tamsulosin HCl (FLOMAX) 0.4 MG CAPS Take 0.4 mg by  mouth at bedtime.      No current facility-administered medications for this visit.     Allergies:   Penicillins; Fentanyl; and Imdur [isosorbide dinitrate]   Social History:  The patient  reports that  has never smoked. he has never used smokeless tobacco. He reports that he drinks alcohol. He reports that he does not use drugs.   Family History:  The patient's family history includes CAD (age of onset: 35) in his unknown relative; Cancer in his unknown relative.    ROS:  Please see the history of present illness.  Otherwise, review of systems is positive for palpitations.  All other systems are reviewed and negative.     PHYSICAL EXAM: VS:  BP 126/70   Pulse (!) 48   Ht 5\' 8"  (1.727 m)   Wt 182 lb 6.4 oz (82.7 kg)   SpO2 94%   BMI 27.73 kg/m  , BMI Body mass index is 27.73 kg/m. GEN: Well nourished, well developed, in no acute distress  HEENT: normal  Neck: no JVD, no masses. No carotid bruits Cardiac: bradycardic and regular without murmur or gallop                Respiratory:  clear to auscultation bilaterally, normal work of breathing GI: soft, nontender, nondistended, + BS MS: no deformity or atrophy  Ext: no pretibial edema, pedal pulses 2+= bilaterally Skin: warm and dry, no rash Neuro:  Strength and sensation are intact Psych: euthymic mood, full affect  EKG:  EKG is not ordered today.  Recent Labs: 05/16/2017: BUN 20; Creatinine, Ser 1.21; Potassium 4.6; Sodium 140   Lipid Panel     Component Value Date/Time   CHOL 124 05/15/2013 0926   TRIG 77.0 05/15/2013 0926   HDL 34.70 (L) 05/15/2013 0926   CHOLHDL 4 05/15/2013 0926   VLDL 15.4 05/15/2013 0926   LDLCALC 74 05/15/2013 0926      Wt Readings from Last 3 Encounters:  05/16/17 182 lb 6.4 oz (82.7 kg)  09/15/16 175 lb (79.4 kg)  08/18/16 175 lb (79.4 kg)     Cardiac Studies Reviewed: GXT 05-09-2017: Study Highlights     Blood pressure demonstrated a normal response to exercise.  Horizontal ST segment depression ST segment depression was noted during stress in the III, II, aVF, V5 and V6 leads.   Baseline ECG changes make ETT non diagnostic 1 mm ST depression in infero lateral leads with stress Patient only achieved 87% of PMHR 120 bpm   Echo 08-05-2015: Study Conclusions  - Left ventricle: The cavity size was mildly dilated. There was   mild concentric hypertrophy. Systolic function was normal. The   estimated ejection fraction was in the range of 55% to 60%. Wall   motion was normal; there were no regional wall motion   abnormalities. - Aortic valve: There was trivial regurgitation. - Mitral valve: Flat  closure no frank prolapse. There was mild   regurgitation. - Atrial septum: No defect or patent foramen ovale was identified.  ASSESSMENT AND PLAN: 1.  CAD, native vessel, with angina: stable CCS II sx's noted. Last cath now 10 years ago. Stress ECG reviewed and exercise time decreased to under 8 minutes with borderline ST changes. Recommend coronary CTA with FFR. Lengthy discussion regarding pros and cons of testing, including radiation exposure and contrast administration. He understands risks and potential benefit for diagnostic purposes. Continue statin drug. No ASA because it exacerbates reflux-type symptoms.   2. Symptomatic bradycardia: appears stable. Follows with  Dr Rayann Heman. Avoid AV nodal blockers.   3. Hyperlipidemia: will send for lipids which have been drawn by his PCP.    Current medicines are reviewed with the patient today.  The patient does not have concerns regarding medicines.  Labs/ tests ordered today include:   Orders Placed This Encounter  Procedures  . CT CORONARY MORPH W/CTA COR W/SCORE W/CA W/CM &/OR WO/CM  . CT CORONARY FRACTIONAL FLOW RESERVE DATA PREP  . CT CORONARY FRACTIONAL FLOW RESERVE FLUID ANALYSIS  . Basic metabolic panel  . EKG 12-Lead    Disposition:   FU one year  Signed, Sherren Mocha, MD  05/16/2017 5:35 PM    [facility] [address], [place], [place]  [address] Phone: [phone]; Fax: [phone]

## 2017-05-16 NOTE — Patient Instructions (Addendum)
Medication Instructions:  Your provider recommends that you continue on your current medications as directed. Please refer to the Current Medication list given to you today.    Labwork: TODAY: BMET  Testing/Procedures: Dr. Burt Knack recommends you have a CARDIAC CT.  Follow-Up: Your provider wants you to follow-up in: 1 year with Dr. Burt Knack. You will receive a reminder letter in the mail two months in advance. If you don't receive a letter, please call our office to schedule the follow-up appointment.    Any Other Special Instructions Will Be Listed Below (If Applicable). Please arrive at the [facility] main entrance of [facility] 30-45 minutes prior to test start time  [facility] [place], [place] [address] [phone]  Proceed to the [facility] Radiology Department (First Floor).  Please follow these instructions carefully (unless otherwise directed):  Hold all erectile dysfunction medications at least 48 hours prior to test.  On the Night Before the Test: . Drink plenty of water. . Do not consume any caffeinated/decaffeinated beverages or chocolate 12 hours prior to your test. . Do not take any antihistamines 12 hours prior to your test.  On the Day of the Test: . Drink plenty of water. Do not drink any water within one hour of the test. . Do not eat any food 4 hours prior to the test. . You may take your regular medications prior to the test.  After the Test: . Drink plenty of water. . After receiving IV contrast, you may experience a mild flushed feeling. This is normal. . On occasion, you may experience a mild rash up to 24 hours after the test. This is not dangerous. If this occurs, you can take Benadryl 25 mg and increase your fluid intake. . If you experience trouble breathing, this can be serious. If it is severe call 911 IMMEDIATELY. If it is mild, please call our office.    If you need a refill on your cardiac medications  before your next appointment, please call your pharmacy.

## 2017-06-11 DIAGNOSIS — N411 Chronic prostatitis: Secondary | ICD-10-CM | POA: Diagnosis not present

## 2017-06-11 DIAGNOSIS — R102 Pelvic and perineal pain: Secondary | ICD-10-CM | POA: Diagnosis not present

## 2017-06-11 DIAGNOSIS — R972 Elevated prostate specific antigen [PSA]: Secondary | ICD-10-CM | POA: Diagnosis not present

## 2017-06-11 DIAGNOSIS — N401 Enlarged prostate with lower urinary tract symptoms: Secondary | ICD-10-CM | POA: Diagnosis not present

## 2017-06-11 DIAGNOSIS — N138 Other obstructive and reflux uropathy: Secondary | ICD-10-CM | POA: Diagnosis not present

## 2017-06-11 DIAGNOSIS — G8929 Other chronic pain: Secondary | ICD-10-CM | POA: Diagnosis not present

## 2017-06-18 ENCOUNTER — Ambulatory Visit (HOSPITAL_COMMUNITY)
Admission: RE | Admit: 2017-06-18 | Discharge: 2017-06-18 | Disposition: A | Discharge status: Home / Self Care | Payer: Medicare Other | Source: Ambulatory Visit | Attending: Cardiovascular Disease | Admitting: Cardiovascular Disease

## 2017-06-18 ENCOUNTER — Ambulatory Visit (HOSPITAL_COMMUNITY): Payer: Medicare Other

## 2017-06-18 DIAGNOSIS — I25118 Atherosclerotic heart disease of native coronary artery with other forms of angina pectoris: Secondary | ICD-10-CM | POA: Insufficient documentation

## 2017-06-18 DIAGNOSIS — I2510 Atherosclerotic heart disease of native coronary artery without angina pectoris: Secondary | ICD-10-CM | POA: Diagnosis not present

## 2017-06-18 DIAGNOSIS — R079 Chest pain, unspecified: Secondary | ICD-10-CM | POA: Diagnosis not present

## 2017-06-18 DIAGNOSIS — I700 Atherosclerosis of aorta: Secondary | ICD-10-CM | POA: Insufficient documentation

## 2017-06-18 MED ORDER — IOPAMIDOL (ISOVUE-370) INJECTION 76%
INTRAVENOUS | Status: AC
  Administered 2017-06-18: 100 mL
  Filled 2017-06-18: qty 100

## 2017-06-18 MED ORDER — IOPAMIDOL (ISOVUE-370) INJECTION 76%
INTRAVENOUS | Status: AC
  Filled 2017-06-18: qty 100

## 2017-06-18 MED ORDER — ACETAMINOPHEN 325 MG PO TABS
ORAL_TABLET | ORAL | Status: AC
  Filled 2017-06-18: qty 2

## 2017-06-18 MED ORDER — NITROGLYCERIN 0.4 MG SL SUBL
SUBLINGUAL_TABLET | SUBLINGUAL | Status: AC
  Filled 2017-06-18: qty 1

## 2017-06-18 NOTE — Progress Notes (Signed)
Pt done with CT scan, D/C home. VS stable.

## 2017-06-21 ENCOUNTER — Telehealth: Payer: Self-pay | Admitting: Cardiovascular Disease

## 2017-06-21 NOTE — Telephone Encounter (Signed)
Advised patient he will be called as soon as results are available.  He was grateful for call.

## 2017-06-21 NOTE — Telephone Encounter (Signed)
New Message   Patient is calling for test results for the CT Coronary that he had on 06/18/2017. Please call.

## 2017-06-25 ENCOUNTER — Ambulatory Visit: Payer: Medicare Other | Admitting: Cardiovascular Disease

## 2017-06-27 ENCOUNTER — Encounter: Payer: Self-pay | Admitting: Cardiovascular Disease

## 2017-06-27 NOTE — Telephone Encounter (Signed)
New message  Pt verbalized that he is calling for RN  To go over CT results

## 2017-06-27 NOTE — Telephone Encounter (Signed)
This encounter was created in error - please disregard.

## 2017-08-12 ENCOUNTER — Other Ambulatory Visit: Payer: Self-pay | Admitting: Cardiovascular Disease

## 2017-08-12 DIAGNOSIS — E785 Hyperlipidemia, unspecified: Secondary | ICD-10-CM

## 2017-10-03 DIAGNOSIS — D1809 Hemangioma of other sites: Secondary | ICD-10-CM | POA: Diagnosis not present

## 2017-10-03 DIAGNOSIS — K1321 Leukoplakia of oral mucosa, including tongue: Secondary | ICD-10-CM | POA: Diagnosis not present

## 2017-10-04 DIAGNOSIS — J029 Acute pharyngitis, unspecified: Secondary | ICD-10-CM | POA: Diagnosis not present

## 2017-10-05 DIAGNOSIS — Z7289 Other problems related to lifestyle: Secondary | ICD-10-CM | POA: Diagnosis not present

## 2017-10-05 DIAGNOSIS — R09A2 Foreign body sensation, throat: Secondary | ICD-10-CM | POA: Insufficient documentation

## 2017-10-05 DIAGNOSIS — R0989 Other specified symptoms and signs involving the circulatory and respiratory systems: Secondary | ICD-10-CM | POA: Insufficient documentation

## 2017-10-05 DIAGNOSIS — J302 Other seasonal allergic rhinitis: Secondary | ICD-10-CM | POA: Diagnosis not present

## 2017-10-05 DIAGNOSIS — K219 Gastro-esophageal reflux disease without esophagitis: Secondary | ICD-10-CM | POA: Diagnosis not present

## 2017-10-15 DIAGNOSIS — K219 Gastro-esophageal reflux disease without esophagitis: Secondary | ICD-10-CM | POA: Diagnosis not present

## 2017-10-15 DIAGNOSIS — Z79899 Other long term (current) drug therapy: Secondary | ICD-10-CM | POA: Diagnosis not present

## 2017-10-23 DIAGNOSIS — D485 Neoplasm of uncertain behavior of skin: Secondary | ICD-10-CM | POA: Diagnosis not present

## 2017-10-23 DIAGNOSIS — C44311 Basal cell carcinoma of skin of nose: Secondary | ICD-10-CM | POA: Diagnosis not present

## 2017-10-30 DIAGNOSIS — C44311 Basal cell carcinoma of skin of nose: Secondary | ICD-10-CM | POA: Diagnosis not present

## 2017-10-30 DIAGNOSIS — Z85828 Personal history of other malignant neoplasm of skin: Secondary | ICD-10-CM | POA: Diagnosis not present

## 2017-12-03 DIAGNOSIS — N401 Enlarged prostate with lower urinary tract symptoms: Secondary | ICD-10-CM | POA: Diagnosis not present

## 2017-12-03 DIAGNOSIS — G8929 Other chronic pain: Secondary | ICD-10-CM | POA: Diagnosis not present

## 2017-12-03 DIAGNOSIS — N138 Other obstructive and reflux uropathy: Secondary | ICD-10-CM | POA: Diagnosis not present

## 2017-12-03 DIAGNOSIS — R972 Elevated prostate specific antigen [PSA]: Secondary | ICD-10-CM | POA: Diagnosis not present

## 2017-12-03 DIAGNOSIS — R102 Pelvic and perineal pain: Secondary | ICD-10-CM | POA: Diagnosis not present

## 2017-12-17 DIAGNOSIS — E7800 Pure hypercholesterolemia, unspecified: Secondary | ICD-10-CM | POA: Diagnosis not present

## 2017-12-17 DIAGNOSIS — Z79899 Other long term (current) drug therapy: Secondary | ICD-10-CM | POA: Diagnosis not present

## 2017-12-17 DIAGNOSIS — K219 Gastro-esophageal reflux disease without esophagitis: Secondary | ICD-10-CM | POA: Diagnosis not present

## 2017-12-17 DIAGNOSIS — Z1389 Encounter for screening for other disorder: Secondary | ICD-10-CM | POA: Diagnosis not present

## 2017-12-17 DIAGNOSIS — Z0000 Encounter for general adult medical examination without abnormal findings: Secondary | ICD-10-CM | POA: Diagnosis not present

## 2018-01-23 DIAGNOSIS — K219 Gastro-esophageal reflux disease without esophagitis: Secondary | ICD-10-CM | POA: Diagnosis not present

## 2018-01-23 DIAGNOSIS — R198 Other specified symptoms and signs involving the digestive system and abdomen: Secondary | ICD-10-CM | POA: Diagnosis not present

## 2018-02-04 DIAGNOSIS — L720 Epidermal cyst: Secondary | ICD-10-CM | POA: Diagnosis not present

## 2018-02-04 DIAGNOSIS — L821 Other seborrheic keratosis: Secondary | ICD-10-CM | POA: Diagnosis not present

## 2018-02-04 DIAGNOSIS — Z85828 Personal history of other malignant neoplasm of skin: Secondary | ICD-10-CM | POA: Diagnosis not present

## 2018-02-04 DIAGNOSIS — L2089 Other atopic dermatitis: Secondary | ICD-10-CM | POA: Diagnosis not present

## 2018-02-12 ENCOUNTER — Ambulatory Visit (INDEPENDENT_AMBULATORY_CARE_PROVIDER_SITE_OTHER): Payer: Medicare Other | Admitting: Sports Medicine

## 2018-02-12 VITALS — BP 116/60 | Ht 68.0 in | Wt 175.0 lb

## 2018-02-12 DIAGNOSIS — M2020 Hallux rigidus, unspecified foot: Secondary | ICD-10-CM

## 2018-02-12 DIAGNOSIS — I25118 Atherosclerotic heart disease of native coronary artery with other forms of angina pectoris: Secondary | ICD-10-CM

## 2018-02-12 NOTE — Progress Notes (Signed)
   Subjective:    Patient ID: Nathaniel [name], male    DOB: 12/07/1945, 72 y.o.   MRN: [id number]  HPI: [id number] presents for evaluation of right big toe pain.  Onset of pain was about 6-8 weeks ago. He associates onset of pain with wearing a new pair of sandals around end of May/ early June.  He noticed pain a few weeks after starting to wear these new shoes. He stopped wearing sandals at the end of July/ early August with minimal relief of pain.  He reports pain is a 7/10 ache that is at it's worst with first step in the morning/ first step after prolonged sitting. Pain improves and resolves with activity. Pain is mostly localized around 1st metatarsal starting on dorsal aspect and radiating in band-like manner to plantar surface around joint.  He has not tried NSAIDs, ASA, or tylenol due to concern of GERD. He has also not tried any topical treatments. He denies any trauma/injury to area. He also denies any fevers, chills, erythema of joint, joint swelling, or weakness in toe.  He reports changing gait in a way to prevent pushing off of big toe due to escalation of pain with this motion.  Of note, patient is asking for stretches/ exercises that may help alleviate pain.   Review of Systems As per above I have reviewed patient's pmh and medications.     Objective:   Physical Exam  G: Alert, oriented, no acute distress PULM: No conversational dyspnea, no respiratory distress MSK:  RIGHT FIRST METATARSAL: no erythema or swelling of joint. Slight hallux rigidus vs Left big toe. +5/5 strength in big toe flexion. Sensation intact. +2 Dorsalis pedis pulse.   [place] of First Metatarsal: effusion along first metatarsophalangeal joint. No obvious spurs appreciable.       Assessment & Plan:  Synovitis of Right First Metatarsophalangeal Joint  Most likely due to irritation of new unsupportive shoe. I do not think that patient will benefit from any specific exercise or stretch at this time.  We discussed  putting in a green sports insole with a first ray post.  He is to wear this insert in all his shoes. I advised patient to stay away from sandals that have caused this inflammation. Patient agrees with plan above.  Due to inflammation, we discussed using topical medication: aspercreme , capzasin, or Voltaren gel to help with pain.  Patient states he has Voltaren gel at home that he can use.  We advised using Voltaren gel PRN until pain has resolved. Follow up in 3 weeks to evaluate pain. If pain has not improved, plan for CSI at that visit.

## 2018-03-21 DIAGNOSIS — Z23 Encounter for immunization: Secondary | ICD-10-CM | POA: Diagnosis not present

## 2018-05-02 ENCOUNTER — Encounter: Payer: Self-pay | Admitting: Cardiovascular Disease

## 2018-05-02 ENCOUNTER — Ambulatory Visit (INDEPENDENT_AMBULATORY_CARE_PROVIDER_SITE_OTHER): Payer: Medicare Other | Admitting: Cardiovascular Disease

## 2018-05-02 VITALS — BP 116/58 | HR 48 | Ht 68.0 in | Wt 176.8 lb

## 2018-05-02 DIAGNOSIS — E782 Mixed hyperlipidemia: Secondary | ICD-10-CM

## 2018-05-02 DIAGNOSIS — I208 Other forms of angina pectoris: Secondary | ICD-10-CM

## 2018-05-02 DIAGNOSIS — K219 Gastro-esophageal reflux disease without esophagitis: Secondary | ICD-10-CM | POA: Diagnosis not present

## 2018-05-02 DIAGNOSIS — R002 Palpitations: Secondary | ICD-10-CM | POA: Diagnosis not present

## 2018-05-02 DIAGNOSIS — R198 Other specified symptoms and signs involving the digestive system and abdomen: Secondary | ICD-10-CM | POA: Diagnosis not present

## 2018-05-02 MED ORDER — ATORVASTATIN CALCIUM 20 MG PO TABS: ORAL_TABLET | ORAL | 3 refills | Status: DC

## 2018-05-02 NOTE — Progress Notes (Signed)
Cardiology Office Note:    Date:  05/02/2018   ID:  Nathaniel [name], DOB 02-Mar-1946, MRN [id number]  PCP:  Lajean Manes, MD  Cardiologist:  No primary care provider on file.  Electrophysiologist:  None   Referring MD: Lajean Manes, MD   Chief Complaint  Patient presents with  . Shortness of Breath    History of Present Illness:    Nathaniel [name] is a 72 y.o. male with a hx of microvascular angina and chronic bradycardia, presenting for follow-up evaluation.   The patient is here alone today.  He is been stable from a cardiac perspective with no change in any of his symptoms of chronic exertional dyspnea or exertional angina.  As long as he avoids high level physical activity he does quite well.  He has not had any resting symptoms.  He does have heart palpitations but no associated episodes of presyncope.  In the past these had problems with increased palpitations and pauses following vigorous exercise.  He now avoids this.  Past Medical History:  Diagnosis Date  . Allergic rhinitis   . Anemia    pt unaware  . Anginal pain ([place])    Microvascular angina- followed by Dr Burt Knack  . Arthritis    back  . Cancer (HCC)    Basal cell -   . Coronary artery disease    nonobstructive  . DVT (deep venous thrombosis) ([place]) 03/2011   after prolonged travel  . GERD (gastroesophageal reflux disease)   . Headache    Vision Migraines  . Hiatal hernia    small  . History of rheumatic fever    questionable  . Hyperlipidemia   . Increased prostate specific antigen (PSA) velocity   . Laryngopharyngeal reflux   . Mitral valve prolapse   . Palpitations   . PE (pulmonary embolism) 03/2011   after prolonged travel  . PONV (postoperative nausea and vomiting)    Fentyl- N/V  . Pruritus    resolved  . Shortness of breath dyspnea    with Exertion  . Sinus bradycardia     Past Surgical History:  Procedure Laterality Date  . COLONOSCOPY    . COLONOSCOPY N/A 02/24/2015   Procedure: COLONOSCOPY;  Surgeon: Ronald Lobo, MD;  Location: [facility] ENDOSCOPY;  Service: Endoscopy;  Laterality: N/A;  . CYSTOSCOPY    . ESOPHAGOGASTRODUODENOSCOPY N/A 02/06/2013   Procedure: ESOPHAGOGASTRODUODENOSCOPY (EGD);  Surgeon: Cleotis Nipper, MD;  Location[facility] ENDOSCOPY;  Service: Endoscopy;  Laterality: N/A;  . ESOPHAGOGASTRODUODENOSCOPY N/A 02/24/2015   Procedure: ESOPHAGOGASTRODUODENOSCOPY (EGD);  Surgeon: Ronald Lobo, MD;  Location: [facility] ENDOSCOPY;  Service: Endoscopy;  Laterality: N/A;  . HUMERUS FRACTURE SURGERY Right 05/2003  . Repair of an AC seperation of the left    . SHOULDER ARTHROSCOPY Left    A/C tear  . TONSILLECTOMY AND ADENOIDECTOMY      Current Medications: Current Meds  Medication Sig  . atorvastatin (LIPITOR) 20 MG tablet TAKE 1 TABLET(20 MG) BY MOUTH DAILY  . cholecalciferol (VITAMIN D) 1000 UNITS tablet Take 1,000 Units by mouth every morning.   . Cyanocobalamin (VITAMIN B-12) 2500 MCG SUBL Place 1 tablet under the tongue every 7 (seven) days. Takes on Wednesday  . dexlansoprazole (DEXILANT) 60 MG capsule Take 60 mg by mouth daily.  Marland Kitchen esomeprazole (NEXIUM) 40 MG capsule Take 40 mg by mouth at bedtime.  Earney Navy Bicarbonate (ZEGERID PO) Take 40 mg by mouth daily.  . Tamsulosin HCl (FLOMAX) 0.4 MG CAPS Take 0.4 mg by  mouth at bedtime.      Allergies:   Penicillins; Fentanyl; and Imdur [isosorbide dinitrate]   Social History   Socioeconomic History  . Marital status: Married    Spouse name: Not on file  . Number of children: Not on file  . Years of education: Not on file  . Highest education level: Not on file  Occupational History  . Not on file  Social Needs  . Financial resource strain: Not on file  . Food insecurity:    Worry: Not on file    Inability: Not on file  . Transportation needs:    Medical: Not on file    Non-medical: Not on file  Tobacco Use  . Smoking status: Never Smoker  . Smokeless tobacco: Never Used    Substance and Sexual Activity  . Alcohol use: Yes    Comment: occasional- special ocassional  . Drug use: No  . Sexual activity: Not on file  Lifestyle  . Physical activity:    Days per week: Not on file    Minutes per session: Not on file  . Stress: Not on file  Relationships  . Social connections:    Talks on phone: Not on file    Gets together: Not on file    Attends religious service: Not on file    Active member of club or organization: Not on file    Attends meetings of clubs or organizations: Not on file    Relationship status: Not on file  Other Topics Concern  . Not on file  Social History Narrative   Pt lives in [place] with wife and daughter (has autism).  Son is an anesthesia resident in [place].   Retired Materials engineer in 2014.     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     Family History: The patient's family history includes CAD (age of onset: 27) in his unknown relative; Cancer in his unknown relative.  ROS:   Please see the history of present illness.    All other systems reviewed and are negative.  EKGs/Labs/Other Studies Reviewed:    The following studies were reviewed today: Coronary CTA 06-18-2017: FINDINGS: Non-cardiac: See separate report from [facility] Radiology.  Calcium Score:  90 Agatston units  Coronary Arteries: Right dominant with no anomalies  LM:  Calcified plaque with mild stenosis.  LAD system: Moderate D1 without significant disease. Mixed plaque in the proximal LAD with mild stenosis. The proximal LAD then passes intramyocardially (bridging) for a short segment.  Circumflex system: Mixed plaque with mild stenosis in the proximal vessel.  RCA system:  No significant disease.  IMPRESSION: 1. Coronary artery calcium score 90 Agatston units placing the patient in the 38th percentile for age and gender.  2.  Nonobstructive coronary disease.  3.  Small intramyocardial segment in the proximal LAD.  Exercise stress test  05-09-2018: Study Highlights     Blood pressure demonstrated a normal response to exercise.  Horizontal ST segment depression ST segment depression was noted during stress in the III, II, aVF, V5 and V6 leads.   Baseline ECG changes make ETT non diagnostic 1 mm ST depression in infero lateral leads with stress Patient only achieved 87% of PMHR 120 bpm  Stress Measurements   Baseline Vitals  Rest HR 52 bpm    Rest BP 147/75 mmHg    Exercise Time  Exercise duration (min) 7 min    Exercise duration (sec) 28 sec    Peak Stress Vitals  Peak HR 129  bpm    Peak BP 178/72 mmHg    Exercise Data  MPHR 148 bpm    Percent HR 87 %    RPE 18     Estimated workload 9.2 METS       EKG:  EKG is ordered today.  The ekg ordered today demonstrates sinus bradycardia 47 bpm, possible age indeterminate septal infarct, no change from prior tracings  Recent Labs: 05/16/2017: BUN 20; Creatinine, Ser 1.21; Potassium 4.6; Sodium 140  Recent Lipid Panel    Component Value Date/Time   CHOL 124 05/15/2013 0926   TRIG 77.0 05/15/2013 0926   HDL 34.70 (L) 05/15/2013 0926   CHOLHDL 4 05/15/2013 0926   VLDL 15.4 05/15/2013 0926   LDLCALC 74 05/15/2013 0926    Physical Exam:    VS:  BP (!) 116/58   Pulse (!) 48   Ht 5\' 8"  (1.727 m)   Wt 176 lb 12.8 oz (80.2 kg)   SpO2 95%   BMI 26.88 kg/m     Wt Readings from Last 3 Encounters:  05/02/18 176 lb 12.8 oz (80.2 kg)  02/12/18 175 lb (79.4 kg)  05/16/17 182 lb 6.4 oz (82.7 kg)     GEN:  Well nourished, well developed in no acute distress HEENT: Normal NECK: No JVD; No carotid bruits LYMPHATICS: No lymphadenopathy CARDIAC: bradycardic and regular, no murmurs, rubs, gallops RESPIRATORY:  Clear to auscultation without rales, wheezing or rhonchi  ABDOMEN: Soft, non-tender, non-distended MUSCULOSKELETAL:  No edema; No deformity  SKIN: Warm and dry NEUROLOGIC:  Alert and oriented x 3 PSYCHIATRIC:  Normal affect   ASSESSMENT:     1. Palpitations   2. Hyperlipidemia   3. Exertional angina (HCC)    PLAN:    In order of problems listed above:  1. Stable pattern.  Likely related to PACs.  Not a candidate for beta-blockade because of resting bradycardia. 2. Treated with atorvastatin 20 mg.  Lipid panel reveals a cholesterol of 119, HDL 34, LDL 65, triglycerides 103. 3. Stable symptoms noted.  Gated coronary CTA findings reviewed from earlier this year with minor nonobstructive CAD.  Overall Nathaniel [name] is stable from a CV perspective. He has stable sinus bradycardia with no cardinal signs of lightheadedness or syncope. I will see him back in one year. I will order an echo in one year prior to his office visit to reassess chronic exertional dyspnea.   Medication Adjustments/Labs and Tests Ordered: Current medicines are reviewed at length with the patient today.  Concerns regarding medicines are outlined above.  No orders of the defined types were placed in this encounter.  No orders of the defined types were placed in this encounter.   There are no Patient Instructions on file for this visit.   Signed, Sherren Mocha, MD  05/02/2018 5:26 PM    [place]

## 2018-05-02 NOTE — Patient Instructions (Signed)
Medication Instructions:  Your provider recommends that you continue on your current medications as directed. Please refer to the Current Medication list given to you today.    Labwork: None  Testing/Procedures: Your provider has requested that you have an echocardiogram in 1 year. Echocardiography is a painless test that uses sound waves to create images of your heart. It provides your doctor with information about the size and shape of your heart and how well your heart's chambers and valves are working. This procedure takes approximately one hour. There are no restrictions for this procedure.  Follow-Up: You will be called to schedule your 1 year echo and office visit.  Any Other Special Instructions Will Be Listed Below (If Applicable).     If you need a refill on your cardiac medications before your next appointment, please call your pharmacy.

## 2018-05-07 DIAGNOSIS — R490 Dysphonia: Secondary | ICD-10-CM | POA: Insufficient documentation

## 2018-05-07 DIAGNOSIS — K219 Gastro-esophageal reflux disease without esophagitis: Secondary | ICD-10-CM | POA: Diagnosis not present

## 2018-05-07 DIAGNOSIS — Z9089 Acquired absence of other organs: Secondary | ICD-10-CM | POA: Diagnosis not present

## 2018-05-07 DIAGNOSIS — Z7289 Other problems related to lifestyle: Secondary | ICD-10-CM | POA: Diagnosis not present

## 2018-06-03 DIAGNOSIS — N138 Other obstructive and reflux uropathy: Secondary | ICD-10-CM | POA: Diagnosis not present

## 2018-06-03 DIAGNOSIS — R972 Elevated prostate specific antigen [PSA]: Secondary | ICD-10-CM | POA: Diagnosis not present

## 2018-06-03 DIAGNOSIS — N411 Chronic prostatitis: Secondary | ICD-10-CM | POA: Diagnosis not present

## 2018-06-03 DIAGNOSIS — G8929 Other chronic pain: Secondary | ICD-10-CM | POA: Diagnosis not present

## 2018-06-03 DIAGNOSIS — N401 Enlarged prostate with lower urinary tract symptoms: Secondary | ICD-10-CM | POA: Diagnosis not present

## 2018-06-03 DIAGNOSIS — R102 Pelvic and perineal pain: Secondary | ICD-10-CM | POA: Diagnosis not present

## 2018-08-12 ENCOUNTER — Ambulatory Visit: Payer: Medicare Other | Admitting: Cardiovascular Disease

## 2018-11-24 NOTE — Progress Notes (Signed)
Virtual Visit via Video Note The purpose of this virtual visit is to provide medical care while limiting exposure to the novel coronavirus.    Consent was obtained for video visit:  Yes Answered questions that patient had about telehealth interaction:  Yes I discussed the limitations, risks, security and privacy concerns of performing an evaluation and management service by telemedicine. I also discussed with the patient that there may be a patient responsible charge related to this service. The patient expressed understanding and agreed to proceed.  Pt location: Home Physician Location: Home Name of referring provider:  Lajean Manes, MD I connected with Nathaniel [name] at patients initiation/request on 11/25/2018 at  3:10 PM EDT by video enabled telemedicine application and verified that I am speaking with the correct person using two identifiers. 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  History supplemented by referring provider note.  He has had classic migraines in the 1980s and 1990s presenting as severe pounding headache associated with nausea and would last 2 or 3 days about every 1 to 2 months.  They weren't associated with visual aura.  They subsequently resolved..  Over the past 10 to 20 years, he would have an infrequent mild-moderate headache easily treated with Tylenol.  Rarely, he would experience ocular migraines.  He would see a vague visual disturbance in the right lower quadrant of his visual field and within 10 to 15 minutes he will see zigzag lines and flashing lights moving across visual field of both eyes to the left.  They would typically last 4-5 minutes and resolve.  Sometimes he may have just a slight dull headache for a day or two but nothing significant.  They would typically occur 1 to 2 times a  year.     Then last year, he started having increased ocular migraines.  Between January to June, he was having about 2 a month.  He was headache-free over the summer and then the recurred in September and October.  They seemed to resolve again until they returned this past January.  Since January, the average 2 to 4 times a month.  Last migraine occurred 7 days ago, which was only 3 days after the previous one.  He does not endorse associated symptoms such as vision loss, slurred speech, facial droop, speech disorder, or unilateral numbness or weakness.  Other than these spells, he feels fine.  He denies any stressors.  Back in March, he hit his head after falling out of bed but these ocular migraines were occurring well before this.  He is concerned about some secondary etiology in his brain causing the increased migraines.  MRI of brain with and without contrast from 12/26/11 was personally reviewed and was unremarkable.  He is a retired Materials engineer.  Past Medical History: Past Medical History:  Diagnosis Date   Allergic rhinitis    Anemia    pt unaware   Anginal pain ([place])    Microvascular angina- followed by Dr Burt Knack   Arthritis    back   Cancer [facility])    Basal cell -    Coronary artery disease    nonobstructive   DVT (deep venous thrombosis) ([place]) 03/2011   after prolonged travel   GERD (gastroesophageal reflux disease)    Headache    Vision Migraines   Hiatal hernia  small   History of rheumatic fever    questionable   Hyperlipidemia    Increased prostate specific antigen (PSA) velocity    Laryngopharyngeal reflux    Mitral valve prolapse    Palpitations    PE (pulmonary embolism) 03/2011   after prolonged travel   PONV (postoperative nausea and vomiting)    Fentyl- N/V   Pruritus    resolved   Shortness of breath dyspnea    with Exertion   Sinus bradycardia     Medications: Outpatient Encounter Medications as of 11/25/2018  Medication Sig     atorvastatin (LIPITOR) 20 MG tablet TAKE 1 TABLET(20 MG) BY MOUTH DAILY   cholecalciferol (VITAMIN D) 1000 UNITS tablet Take 1,000 Units by mouth every morning.    Cyanocobalamin (VITAMIN B-12) 2500 MCG SUBL Place 1 tablet under the tongue every 7 (seven) days. Takes on Wednesday   esomeprazole (NEXIUM) 40 MG capsule Take 40 mg by mouth at bedtime.   Omeprazole-Sodium Bicarbonate (ZEGERID PO) Take 40 mg by mouth daily.   Tamsulosin HCl (FLOMAX) 0.4 MG CAPS Take 0.4 mg by mouth at bedtime.    [DISCONTINUED] dexlansoprazole (DEXILANT) 60 MG capsule Take 60 mg by mouth daily.   No facility-administered encounter medications on file as of 11/25/2018.     Allergies: Allergies  Allergen Reactions   Penicillins Anaphylaxis and Other (See Comments)    Hands swell, throat swells Hands swell, throat swells No reaction listed   Fentanyl Nausea And Vomiting and Other (See Comments)   Imdur [Isosorbide Dinitrate] Other (See Comments)    Headache    Family History: Family History  Problem Relation Age of Onset   Cancer Unknown        mother (BrCa and glioblastoma) and brother (GE junction)   CAD Unknown 15       father died suddenly in the setting of anginal symptoms    Social History: Social History   Socioeconomic History   Marital status: Married    Spouse name: Not on file   Number of children: Not on file   Years of education: Not on file   Highest education level: Not on file  Occupational History   Not on file  Social Needs   Financial resource strain: Not on file   Food insecurity    Worry: Not on file    Inability: Not on file   Transportation needs    Medical: Not on file    Non-medical: Not on file  Tobacco Use   Smoking status: Never Smoker   Smokeless tobacco: Never Used  Substance and Sexual Activity   Alcohol use: Yes    Comment: occasional- special ocassional   Drug use: No   Sexual activity: Not on file  Lifestyle   Physical  activity    Days per week: Not on file    Minutes per session: Not on file   Stress: Not on file  Relationships   Social connections    Talks on phone: Not on file    Gets together: Not on file    Attends religious service: Not on file    Active member of club or organization: Not on file    Attends meetings of clubs or organizations: Not on file    Relationship status: Not on file   Intimate partner violence    Fear of current or ex partner: Not on file    Emotionally abused: Not on file    Physically abused: Not on file  Forced sexual activity: Not on file  Other Topics Concern   Not on file  Social History Narrative   Pt lives in [place] with wife and daughter (has autism).  Son is an anesthesia resident in [place].   Retired Materials engineer in 2014.     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   Observations/Objective:   There were no vitals filed for this visit. No acute distress.  Alert and oriented.  Speech fluent and not dysarthric.  Language intact.  Face symmetric.   Assessment and Plan:   1.  Ocular migraines, dramatically increased frequency over the past year.  1.  We will check MRI of brain with and without contrast and MRA of head to evaluate for secondary etiology (structural or vascular). 2.  He defers migraine preventative medication at this time.  He would like to complete testing first.  Follow Up Instructions:    -I discussed the assessment and treatment plan with the patient. The patient was provided an opportunity to ask questions and all were answered. The patient agreed with the plan and demonstrated an understanding of the instructions.   The patient was advised to call back or seek an in-person evaluation if the symptoms worsen or if the condition fails to improve as anticipated.  Dudley Major, DO

## 2018-11-25 ENCOUNTER — Other Ambulatory Visit: Payer: Self-pay

## 2018-11-25 ENCOUNTER — Telehealth (INDEPENDENT_AMBULATORY_CARE_PROVIDER_SITE_OTHER): Payer: Medicare Other | Admitting: Neurology

## 2018-11-25 ENCOUNTER — Encounter: Payer: Self-pay | Admitting: Neurology

## 2018-11-25 DIAGNOSIS — G43109 Migraine with aura, not intractable, without status migrainosus: Secondary | ICD-10-CM | POA: Diagnosis not present

## 2018-11-25 DIAGNOSIS — R519 Headache, unspecified: Secondary | ICD-10-CM

## 2018-11-25 NOTE — Addendum Note (Signed)
Addended by: Clois Comber on: 11/25/2018 03:59 PM   Modules accepted: Orders

## 2018-11-28 ENCOUNTER — Telehealth: Payer: Self-pay | Admitting: Neurology

## 2018-11-28 NOTE — Telephone Encounter (Signed)
Pt request update on scheduling MRI. Call pt [phone] or pt spouse Dedra Skeens [phone] as secondary contact phone.

## 2018-11-28 NOTE — Telephone Encounter (Signed)
Called and LMOVM advising Pt of contact and location information of [facility]

## 2018-12-03 ENCOUNTER — Other Ambulatory Visit: Payer: Self-pay

## 2018-12-03 ENCOUNTER — Telehealth: Payer: Self-pay | Admitting: Neurology

## 2018-12-03 DIAGNOSIS — R519 Headache, unspecified: Secondary | ICD-10-CM

## 2018-12-03 DIAGNOSIS — G8929 Other chronic pain: Secondary | ICD-10-CM

## 2018-12-03 DIAGNOSIS — G43109 Migraine with aura, not intractable, without status migrainosus: Secondary | ICD-10-CM

## 2018-12-03 NOTE — Telephone Encounter (Signed)
Spoke with patient regarding MRI schedule.  Patient is going out of town July 6 and wants MRI done before then.  [facility] is unable to get patient schedule.  New order placed for [facility].  Patient also called Dr Sherryll Burger office to try to get the MRI expedited.  Imaging is schedule for 12/10/2018 at 8 am at [facility].  Patient aware.

## 2018-12-03 NOTE — Telephone Encounter (Signed)
Patient left VM about MRI and scheduling. He is having a difficult time getting it scheduled and has questions about orders. Please call him back. Thanks!

## 2018-12-09 ENCOUNTER — Other Ambulatory Visit: Payer: 59

## 2018-12-10 ENCOUNTER — Other Ambulatory Visit: Payer: Self-pay

## 2018-12-10 ENCOUNTER — Ambulatory Visit (HOSPITAL_COMMUNITY)
Admission: RE | Admit: 2018-12-10 | Discharge: 2018-12-10 | Disposition: A | Discharge status: Home / Self Care | Payer: Medicare Other | Source: Ambulatory Visit | Attending: Neurology | Admitting: Neurology

## 2018-12-10 DIAGNOSIS — G43109 Migraine with aura, not intractable, without status migrainosus: Secondary | ICD-10-CM | POA: Diagnosis present

## 2018-12-10 DIAGNOSIS — R51 Headache: Secondary | ICD-10-CM | POA: Diagnosis present

## 2018-12-10 DIAGNOSIS — R519 Headache, unspecified: Secondary | ICD-10-CM

## 2018-12-10 LAB — POCT I-STAT CREATININE: Creatinine, Ser: 1.2 mg/dL (ref 0.61–1.24)

## 2018-12-10 MED ORDER — GADOBUTROL 1 MMOL/ML IV SOLN
7.0000 mL | Freq: Once | INTRAVENOUS | Status: AC | PRN
  Administered 2018-12-10: 7 mL via INTRAVENOUS

## 2018-12-11 ENCOUNTER — Telehealth: Payer: Self-pay

## 2018-12-11 ENCOUNTER — Telehealth: Payer: Self-pay | Admitting: Neurology

## 2018-12-11 NOTE — Telephone Encounter (Signed)
Called and advised Pt of results.

## 2018-12-11 NOTE — Telephone Encounter (Signed)
-----   Message from Pieter Partridge, DO sent at 12/10/2018 11:46 AM EDT ----- MRI and MRA of the brain are normal.  No evidence of tumor, aneurysm, intracranial arterial stenosis or other vascular abnormality.

## 2018-12-11 NOTE — Telephone Encounter (Signed)
Called and advised Pt of imaging results. He would like a copy of the full report. Pt wanted to know what he should do about the spells he has been experiencing now that he has been assured there is not an abnormality in structure, nor any type of tumor. I reminded him of the possibility of a preventative medication. Pt would like an appointment to discuss this.

## 2018-12-11 NOTE — Telephone Encounter (Signed)
Patient is calling in needing MRI results. Please call him back. Thanks!

## 2018-12-12 ENCOUNTER — Telehealth: Payer: Self-pay | Admitting: Neurology

## 2018-12-12 NOTE — Telephone Encounter (Signed)
Pt is schedule for 12-16-18 for a virtual visit

## 2018-12-12 NOTE — Telephone Encounter (Signed)
lmom for patient to do a virtual visit, will try back

## 2018-12-15 NOTE — Progress Notes (Signed)
Virtual Visit via Video Note The purpose of this virtual visit is to provide medical care while limiting exposure to the novel coronavirus.    Consent was obtained for video visit:  Yes Answered questions that patient had about telehealth interaction:  Yes I discussed the limitations, risks, security and privacy concerns of performing an evaluation and management service by telemedicine. I also discussed with the patient that there may be a patient responsible charge related to this service. The patient expressed understanding and agreed to proceed.  Pt location: Home Physician Location: Home Name of referring provider:  Lajean Manes, MD I connected with Nathaniel [name] at patients initiation/request on 12/16/2018 at  3:30 PM EDT by video enabled telemedicine application and verified that I am speaking with the correct person using two identifiers. 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  UPDATE: MRI of brain with and without contrast and MRA of head from 12/10/18 were personally reviewed and were unremarkable with no acute intracranial abnormality or emergent large vessel occlusion or stenosis.  They are still occurring frequently but last one occurred about a week ago.    HISTORY: He has had classic migraines in the 1980s and 1990s presenting as severe pounding headache associated with nausea and would last 2 or 3 days about every 1 to 2 months.  They weren't associated with visual aura.  They subsequently resolved..  Over the past 10 to 20 years, he would have an infrequent mild-moderate headache easily treated with Tylenol.  Rarely, he would experience ocular migraines.  He would see a vague visual disturbance in the right lower quadrant of his visual field and within 10 to 15 minutes he  will see zigzag lines and flashing lights moving across visual field of both eyes to the left.  They would typically last 4-5 minutes and resolve.  Sometimes he may have just a slight dull headache for a day or two but nothing significant.  They would typically occur 1 to 2 times a year.     Then last year, he started having increased ocular migraines.  Between January to June, he was having about 2 a month.  He was headache-free over the summer and then the recurred in September and October.  They seemed to resolve again until they returned this past January.  Since January, the average 2 to 4 times a month.  Last migraine occurred 7 days ago, which was only 3 days after the previous one.  He does not endorse associated symptoms such as vision loss, slurred speech, facial droop, speech disorder, or unilateral numbness or weakness.  Other than these spells, he feels fine.  He denies any stressors.  Back in March, he hit his head after falling out of bed but these ocular migraines were occurring well before this.  He is concerned about some secondary etiology in his brain causing the increased migraines.  MRI of brain with and without contrast from 12/26/11 was personally reviewed and was unremarkable.  He is a retired Materials engineer.  Past Medical History: Past Medical History:  Diagnosis Date  . Allergic rhinitis   . Anemia    pt unaware  . Anginal pain ([place])    Microvascular angina- followed by Dr Burt Knack  . Arthritis    back  . Cancer (  [place])    Basal cell -   . Coronary artery disease    nonobstructive  . DVT (deep venous thrombosis) ([place]) 03/2011   after prolonged travel  . GERD (gastroesophageal reflux disease)   . Headache    Vision Migraines  . Hiatal hernia    small  . History of rheumatic fever    questionable  . Hyperlipidemia   . Increased prostate specific antigen (PSA) velocity   . Laryngopharyngeal reflux   . Mitral valve prolapse   . Palpitations   . PE (pulmonary  embolism) 03/2011   after prolonged travel  . PONV (postoperative nausea and vomiting)    Fentyl- N/V  . Pruritus    resolved  . Shortness of breath dyspnea    with Exertion  . Sinus bradycardia     Medications: Outpatient Encounter Medications as of 12/16/2018  Medication Sig  . atorvastatin (LIPITOR) 20 MG tablet TAKE 1 TABLET(20 MG) BY MOUTH DAILY  . cholecalciferol (VITAMIN D) 1000 UNITS tablet Take 1,000 Units by mouth every morning.   . Cyanocobalamin (VITAMIN B-12) 2500 MCG SUBL Place 1 tablet under the tongue every 7 (seven) days. Takes on Wednesday  . esomeprazole (NEXIUM) 40 MG capsule Take 40 mg by mouth at bedtime.  [place] Bicarbonate (ZEGERID PO) Take 40 mg by mouth daily.  . Tamsulosin HCl (FLOMAX) 0.4 MG CAPS Take 0.4 mg by mouth at bedtime.    No facility-administered encounter medications on file as of 12/16/2018.     Allergies: Allergies  Allergen Reactions  . Penicillins Anaphylaxis and Other (See Comments)    Hands swell, throat swells Hands swell, throat swells No reaction listed  . Fentanyl Nausea And Vomiting and Other (See Comments)  . Imdur [Isosorbide Dinitrate] Other (See Comments)    Headache    Family History: Family History  Problem Relation Age of Onset  . Cancer Unknown        mother (BrCa and glioblastoma) and brother (GE junction)  . CAD Unknown 30       father died suddenly in the setting of anginal symptoms    Social History: Social History   Socioeconomic History  . Marital status: Married    Spouse name: Not on file  . Number of children: Not on file  . Years of education: Not on file  . Highest education level: Not on file  Occupational History  . Not on file  Social Needs  . Financial resource strain: Not on file  . Food insecurity    Worry: Not on file    Inability: Not on file  . Transportation needs    Medical: Not on file    Non-medical: Not on file  Tobacco Use  . Smoking status: Never Smoker  .  Smokeless tobacco: Never Used  Substance and Sexual Activity  . Alcohol use: Yes    Comment: occasional- special ocassional  . Drug use: No  . Sexual activity: Not on file  Lifestyle  . Physical activity    Days per week: Not on file    Minutes per session: Not on file  . Stress: Not on file  Relationships  . Social Herbalist on phone: Not on file    Gets together: Not on file    Attends religious service: Not on file    Active member of club or organization: Not on file    Attends meetings of clubs or organizations: Not on file    Relationship status: Not  on file  . Intimate partner violence    Fear of current or ex partner: Not on file    Emotionally abused: Not on file    Physically abused: Not on file    Forced sexual activity: Not on file  Other Topics Concern  . Not on file  Social History Narrative   Pt lives in [place] with wife and daughter (has autism).  Son is an anesthesia resident in [place].   Retired Materials engineer in 2014.     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    Observations/Objective:   Height '5\' 8"'  (1.727 m), weight 176 lb (79.8 kg). No acute distress.  Alert and oriented.  Speech fluent and not dysarthric.  Language intact.  Eyes orthophoric on primary gaze.  Face symmetric.  Assessment and Plan:   Ocular migraines  At this point, he still defers starting a prophylactic.  If they should continue occurring more than once a week, then he would consider one.  He has baseline HR in the 50s and beta blockers have caused bradycardia.  Other options would include an anti-CGRP or topiramate.  Follow Up Instructions:    -I discussed the assessment and treatment plan with the patient. The patient was provided an opportunity to ask questions and all were answered. The patient agreed with the plan and demonstrated an understanding of the instructions.   The patient was advised to call back or seek an in-person evaluation if the symptoms worsen or if  the condition fails to improve as anticipated.    Total Time spent in visit with the patient was:  20 minutes   Dudley Major, DO

## 2018-12-16 ENCOUNTER — Other Ambulatory Visit: Payer: Self-pay

## 2018-12-16 ENCOUNTER — Telehealth (INDEPENDENT_AMBULATORY_CARE_PROVIDER_SITE_OTHER): Payer: Medicare Other | Admitting: Neurology

## 2018-12-16 ENCOUNTER — Encounter: Payer: Self-pay | Admitting: Neurology

## 2018-12-16 VITALS — Ht 68.0 in | Wt 176.0 lb

## 2018-12-16 DIAGNOSIS — G43109 Migraine with aura, not intractable, without status migrainosus: Secondary | ICD-10-CM | POA: Diagnosis not present

## 2019-01-21 ENCOUNTER — Other Ambulatory Visit: Payer: Self-pay

## 2019-01-21 ENCOUNTER — Encounter (HOSPITAL_COMMUNITY): Payer: Self-pay | Admitting: Emergency Medicine

## 2019-01-21 ENCOUNTER — Ambulatory Visit (HOSPITAL_COMMUNITY)
Admission: EM | Admit: 2019-01-21 | Discharge: 2019-01-21 | Disposition: A | Discharge status: Home / Self Care | Payer: 59 | Attending: Family Medicine | Admitting: Family Medicine

## 2019-01-21 DIAGNOSIS — Z203 Contact with and (suspected) exposure to rabies: Secondary | ICD-10-CM

## 2019-01-21 DIAGNOSIS — Z23 Encounter for immunization: Secondary | ICD-10-CM

## 2019-01-21 MED ORDER — RABIES VACCINE, PCEC IM SUSR
1.0000 mL | Freq: Once | INTRAMUSCULAR | Status: AC
  Administered 2019-01-21: 1 mL via INTRAMUSCULAR

## 2019-01-21 MED ORDER — RABIES VACCINE, PCEC IM SUSR
INTRAMUSCULAR | Status: AC
  Filled 2019-01-21: qty 1

## 2019-01-21 NOTE — ED Triage Notes (Signed)
Pt here for final rabies had subsequent in [place] given in left arm

## 2019-01-30 ENCOUNTER — Other Ambulatory Visit: Payer: 59

## 2019-04-08 ENCOUNTER — Other Ambulatory Visit: Payer: Self-pay

## 2019-04-08 ENCOUNTER — Other Ambulatory Visit: Payer: Self-pay | Admitting: Surgery

## 2019-04-08 DIAGNOSIS — Z20822 Contact with and (suspected) exposure to covid-19: Secondary | ICD-10-CM

## 2019-04-08 DIAGNOSIS — R1031 Right lower quadrant pain: Secondary | ICD-10-CM

## 2019-04-10 LAB — NOVEL CORONAVIRUS, NAA: SARS-CoV-2, NAA: NOT DETECTED

## 2019-04-16 ENCOUNTER — Other Ambulatory Visit: Payer: Medicare Other

## 2019-04-23 ENCOUNTER — Other Ambulatory Visit: Payer: Self-pay | Admitting: Cardiovascular Disease

## 2019-04-23 DIAGNOSIS — E782 Mixed hyperlipidemia: Secondary | ICD-10-CM

## 2019-04-24 ENCOUNTER — Other Ambulatory Visit: Payer: Self-pay | Admitting: Cardiovascular Disease

## 2019-04-24 DIAGNOSIS — E782 Mixed hyperlipidemia: Secondary | ICD-10-CM

## 2019-05-01 ENCOUNTER — Other Ambulatory Visit: Payer: Self-pay

## 2019-05-01 ENCOUNTER — Ambulatory Visit (HOSPITAL_COMMUNITY): Payer: Medicare Other | Attending: Cardiology

## 2019-05-01 DIAGNOSIS — I208 Other forms of angina pectoris: Secondary | ICD-10-CM

## 2019-05-05 ENCOUNTER — Other Ambulatory Visit: Payer: Self-pay

## 2019-05-05 ENCOUNTER — Encounter: Payer: Self-pay | Admitting: Cardiovascular Disease

## 2019-05-05 ENCOUNTER — Ambulatory Visit: Payer: Medicare Other | Admitting: Cardiovascular Disease

## 2019-05-05 VITALS — BP 110/70 | HR 37 | Ht 68.0 in | Wt 175.1 lb

## 2019-05-05 DIAGNOSIS — R001 Bradycardia, unspecified: Secondary | ICD-10-CM

## 2019-05-05 DIAGNOSIS — I208 Other forms of angina pectoris: Secondary | ICD-10-CM | POA: Diagnosis not present

## 2019-05-05 DIAGNOSIS — E782 Mixed hyperlipidemia: Secondary | ICD-10-CM

## 2019-05-05 MED ORDER — ATORVASTATIN CALCIUM 20 MG PO TABS: 20.0000 mg | ORAL_TABLET | Freq: Every day | ORAL | 3 refills | Status: DC

## 2019-05-05 NOTE — Patient Instructions (Signed)
Medication Instructions:  Your provider recommends that you continue on your current medications as directed. Please refer to the Current Medication list given to you today.    Testing/Procedures: Dr. Burt Knack recommends you wear a ZIO patch for 2 weeks.  Your physician has requested that you have a stress echocardiogram in 1 year. For further information please visit [email or web address]. Please follow instruction sheet as given.  Follow-Up: You will be called to arrange your 1 year echo and office visit.

## 2019-05-05 NOTE — Progress Notes (Signed)
Cardiology Office Note:    Date:  05/05/2019   ID:  Nathaniel [name], DOB 09-21-1945, MRN [id number]  PCP:  Lajean Manes, MD  Cardiologist:  Sherren Mocha, MD  Electrophysiologist:  None   Referring MD: Lajean Manes, MD   Chief Complaint  Patient presents with  . Shortness of Breath  . Chest Pain    History of Present Illness:    Nathaniel [name] is a 73 y.o. male with a hx of nonobstructive CAD, chronic microvascular angina, and bradycardia, presenting for follow-up evaluation.   He's here alone today. He continues to have predictable angina with walking up a grade or with carrying objects on level ground or up stairs. Pain is a pressure like sensation over the substernal region, non-radiating and relieved with rest. This hasn't changed over time. He is mildly limited, now avoiding vigorous exercise. He also has issues with bradycardia following vigorous exercise, sometimes with associated dizziness. He has been formally evaluated by Dr Rayann Heman and they have opted for ongoing observation rather than pacemaker implantation in an absence of high grade heart block.   Past Medical History:  Diagnosis Date  . Allergic rhinitis   . Anemia    pt unaware  . Anginal pain ([place])    Microvascular angina- followed by Dr Burt Knack  . Arthritis    back  . Cancer (HCC)    Basal cell -   . Coronary artery disease    nonobstructive  . DVT (deep venous thrombosis) ([place]) 03/2011   after prolonged travel  . GERD (gastroesophageal reflux disease)   . Headache    Vision Migraines  . Hiatal hernia    small  . History of rheumatic fever    questionable  . Hyperlipidemia   . Increased prostate specific antigen (PSA) velocity   . Laryngopharyngeal reflux   . Mitral valve prolapse   . Palpitations   . PE (pulmonary embolism) 03/2011   after prolonged travel  . PONV (postoperative nausea and vomiting)    Fentyl- N/V  . Pruritus    resolved  . Shortness of breath dyspnea    with  Exertion  . Sinus bradycardia     Past Surgical History:  Procedure Laterality Date  . COLONOSCOPY    . COLONOSCOPY N/A 02/24/2015   Procedure: COLONOSCOPY;  Surgeon: Ronald Lobo, MD;  Location: [facility] ENDOSCOPY;  Service: Endoscopy;  Laterality: N/A;  . CYSTOSCOPY    . ESOPHAGOGASTRODUODENOSCOPY N/A 02/06/2013   Procedure: ESOPHAGOGASTRODUODENOSCOPY (EGD);  Surgeon: Cleotis Nipper, MD;  Location: [facility] ENDOSCOPY;  Service: Endoscopy;  Laterality: N/A;  . ESOPHAGOGASTRODUODENOSCOPY N/A 02/24/2015   Procedure: ESOPHAGOGASTRODUODENOSCOPY (EGD);  Surgeon: Ronald Lobo, MD;  Location[facility] ENDOSCOPY;  Service: Endoscopy;  Laterality: N/A;  . HUMERUS FRACTURE SURGERY Right 05/2003  . Repair of an AC seperation of the left    . SHOULDER ARTHROSCOPY Left    A/C tear  . TONSILLECTOMY AND ADENOIDECTOMY      Current Medications: Current Meds  Medication Sig  . atorvastatin (LIPITOR) 20 MG tablet Take 1 tablet (20 mg total) by mouth daily.  . cholecalciferol (VITAMIN D) 1000 UNITS tablet Take 1,000 Units by mouth every morning.   . Cyanocobalamin (VITAMIN B-12) 2500 MCG SUBL Place 1 tablet under the tongue every 7 (seven) days. Takes on Wednesday  . esomeprazole (NEXIUM) 40 MG capsule Take 40 mg by mouth at bedtime.  Earney Navy Bicarbonate (ZEGERID PO) Take 40 mg by mouth daily.  . Tamsulosin HCl (FLOMAX) 0.4 MG CAPS Take  0.4 mg by mouth at bedtime.   . [DISCONTINUED] atorvastatin (LIPITOR) 20 MG tablet TAKE 1 TABLET(20 MG) BY MOUTH DAILY. Please keep upcoming appt in November with Dr. Burt Knack for future refills. Thank you     Allergies:   Penicillins, Fentanyl, and Imdur [isosorbide dinitrate]   Social History   Socioeconomic History  . Marital status: Married    Spouse name: Not on file  . Number of children: Not on file  . Years of education: Not on file  . Highest education level: Not on file  Occupational History  . Occupation: retired  Scientific laboratory technician  . Financial resource  strain: Not on file  . Food insecurity    Worry: Not on file    Inability: Not on file  . Transportation needs    Medical: Not on file    Non-medical: Not on file  Tobacco Use  . Smoking status: Never Smoker  . Smokeless tobacco: Never Used  Substance and Sexual Activity  . Alcohol use: Yes    Comment: occasional- special ocassional  . Drug use: No  . Sexual activity: Not on file  Lifestyle  . Physical activity    Days per week: Not on file    Minutes per session: Not on file  . Stress: Not on file  Relationships  . Social Herbalist on phone: Not on file    Gets together: Not on file    Attends religious service: Not on file    Active member of club or organization: Not on file    Attends meetings of clubs or organizations: Not on file    Relationship status: Not on file  Other Topics Concern  . Not on file  Social History Narrative   Pt lives in [place] with wife and daughter (has autism).  Son is an anesthesia resident in [place].   Retired Materials engineer in 2014.     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     Family History: The patient's family history includes CAD (age of onset: 38) in his unknown relative; Cancer in his unknown relative.  ROS:   Please see the history of present illness.    All other systems reviewed and are negative.  EKGs/Labs/Other Studies Reviewed:    The following studies were reviewed today: Cardiac CTA 06/18/2017: IMPRESSION: 1. Coronary artery calcium score 90 Agatston units placing the patient in the 38th percentile for age and gender.  2.  Nonobstructive coronary disease.  3.  Small intramyocardial segment in the proximal LAD.  GXT 05/09/2017: Study Highlights    Blood pressure demonstrated a normal response to exercise.  Horizontal ST segment depression ST segment depression was noted during stress in the III, II, aVF, V5 and V6 leads.   Baseline ECG changes make ETT non diagnostic 1 mm ST depression in infero  lateral leads with stress Patient only achieved 87% of PMHR 120 bpm   Echo 05-01-2019: IMPRESSIONS    1. Left ventricular ejection fraction, by visual estimation, is 50 to 55%. The left ventricle has normal function. There is mildly increased left ventricular hypertrophy.  2. Elevated left atrial pressure.  3. Left ventricular diastolic parameters are consistent with Grade I diastolic dysfunction (impaired relaxation).  4. Global right ventricle has normal systolic function.The right ventricular size is normal.  5. Left atrial size was normal.  6. Right atrial size was normal.  7. The mitral valve is normal in structure. No evidence of mitral valve regurgitation. No evidence of  mitral stenosis.  8. The tricuspid valve is normal in structure. Tricuspid valve regurgitation is trivial.  9. The aortic valve is tricuspid. Aortic valve regurgitation is mild. Mild aortic valve sclerosis without stenosis. 10. The pulmonic valve was normal in structure. Pulmonic valve regurgitation is trivial. 11. Aortic dilatation noted. 12. There is mild dilatation of the ascending aorta measuring 39 mm. 13. Mildly elevated pulmonary artery systolic pressure. 14. The inferior vena cava is normal in size with greater than 50% respiratory variability, suggesting right atrial pressure of 3 mmHg. 15. Normal LV function; grade 1 diastolic dysfunction; mild LVH: mildly dilated ascendinga aorta; mild AI.  EKG:  EKG is ordered today.  The ekg ordered today demonstrates marked sinus bradycardia with marked sinus arrhythmia, HR 37 bpm, first degree AV block 216 ms.  Recent Labs: 12/10/2018: Creatinine, Ser 1.20  Recent Lipid Panel    Component Value Date/Time   CHOL 124 05/15/2013 0926   TRIG 77.0 05/15/2013 0926   HDL 34.70 (L) 05/15/2013 0926   CHOLHDL 4 05/15/2013 0926   VLDL 15.4 05/15/2013 0926   LDLCALC 74 05/15/2013 0926    Physical Exam:    VS:  BP 110/70   Pulse (!) 37   Ht 5\' 8"  (1.727 m)   Wt  175 lb 1.9 oz (79.4 kg)   SpO2 98%   BMI 26.63 kg/m     Wt Readings from Last 3 Encounters:  05/05/19 175 lb 1.9 oz (79.4 kg)  12/16/18 176 lb (79.8 kg)  05/02/18 176 lb 12.8 oz (80.2 kg)     GEN:  Well nourished, well developed in no acute distress HEENT: Normal NECK: No JVD; No carotid bruits LYMPHATICS: No lymphadenopathy CARDIAC: bradycardic and regular, no murmurs, rubs, gallops RESPIRATORY:  Clear to auscultation without rales, wheezing or rhonchi  ABDOMEN: Soft, non-tender, non-distended MUSCULOSKELETAL:  No edema; No deformity  SKIN: Warm and dry NEUROLOGIC:  Alert and oriented x 3 PSYCHIATRIC:  Normal affect   ASSESSMENT:    1. Exertional angina (HCC)   2. Mixed hyperlipidemia   3. Bradycardia    PLAN:    In order of problems listed above:  1. Stable, CCS class II sx's occurring predictably with exertion and relieved with rest. Most recent assessment with coronary CTA as above, demonstrating no obstructive disease. Continue aggressive medical therapy (atorvastatin). Unable to take a beta blocker due to resting bradycardia. Recommend stress echo prior to next year's office evaluation. 2. Treated with atorvastatin. LDL 67 mg/dL. 3. Marked bradycardia on today's EKG. Appears to be asymptomatic. Recommend Zio patch monitor.  Medication Adjustments/Labs and Tests Ordered: Current medicines are reviewed at length with the patient today.  Concerns regarding medicines are outlined above.  Orders Placed This Encounter  Procedures  . LONG TERM MONITOR (3-14 DAYS)  . EKG 12-Lead  . ECHO STRESS   Meds ordered this encounter  Medications  . atorvastatin (LIPITOR) 20 MG tablet    Sig: Take 1 tablet (20 mg total) by mouth daily.    Dispense:  90 tablet    Refill:  3    Patient Instructions  Medication Instructions:  Your provider recommends that you continue on your current medications as directed. Please refer to the Current Medication list given to you today.     Testing/Procedures: Dr. Burt Knack recommends you wear a ZIO patch for 2 weeks.  Your physician has requested that you have a stress echocardiogram in 1 year. For further information please visit [email or web address]. Please follow instruction sheet as given.  Follow-Up: You will be called to arrange your 1 year echo and office visit.    Signed, Sherren Mocha, MD  05/05/2019 5:00 PM    Nathaniel [name]

## 2019-06-12 ENCOUNTER — Telehealth: Payer: Self-pay | Admitting: Radiology

## 2019-06-12 ENCOUNTER — Telehealth: Payer: Self-pay | Admitting: Cardiovascular Disease

## 2019-06-12 NOTE — Telephone Encounter (Signed)
Informed patient the Elwyn Reach will be mailed to his home. Briefly reviewed instructions. He understands he will get the monitor in about a week and there are picture instructions inside. He understands he will be called when the results are complete.

## 2019-06-12 NOTE — Telephone Encounter (Signed)
New Message  Pt called and stated that Dr. Burt Knack wanted him to get a zio monitor and he was called to see if he could set that up.  Please call to discuss

## 2019-06-12 NOTE — Telephone Encounter (Signed)
Enrolled patient for a 14 day Zio monitor to be mailed to patients home.  

## 2019-06-20 ENCOUNTER — Other Ambulatory Visit (INDEPENDENT_AMBULATORY_CARE_PROVIDER_SITE_OTHER): Payer: Medicare Other

## 2019-06-20 DIAGNOSIS — R001 Bradycardia, unspecified: Secondary | ICD-10-CM | POA: Diagnosis not present

## 2019-06-24 ENCOUNTER — Ambulatory Visit: Payer: Medicare Other | Attending: Internal Medicine

## 2019-06-24 DIAGNOSIS — Z20822 Contact with and (suspected) exposure to covid-19: Secondary | ICD-10-CM

## 2019-06-26 LAB — NOVEL CORONAVIRUS, NAA: SARS-CoV-2, NAA: NOT DETECTED

## 2019-07-17 ENCOUNTER — Telehealth: Payer: Self-pay | Admitting: Cardiovascular Disease

## 2019-07-17 NOTE — Telephone Encounter (Signed)
Patient is calling requesting his Zio results. Patient states he would like Lenice Llamas to call him back in regards to this.

## 2019-07-18 NOTE — Telephone Encounter (Signed)
Reviewed monitor results in detail with patient. He will monitor symptoms and call if dizziness, presyncope occurs. He was grateful for assistance.

## 2019-07-25 ENCOUNTER — Ambulatory Visit: Payer: Medicare Other

## 2019-08-28 ENCOUNTER — Ambulatory Visit: Payer: Medicare Other | Attending: Internal Medicine

## 2019-08-28 DIAGNOSIS — Z23 Encounter for immunization: Secondary | ICD-10-CM

## 2019-08-28 NOTE — Progress Notes (Signed)
   Covid-19 Vaccination Clinic  Name:  Nathaniel [name]    MRN: [id number] DOB: April 29, 1946  08/28/2019  Nathaniel [name] was observed post Covid-19 immunization for 15 minutes without incident. He was provided with Vaccine Information Sheet and instruction to access the V-Safe system.   Nathaniel [name] was instructed to call 911 with any severe reactions post vaccine: Marland Kitchen Difficulty breathing  . Swelling of face and throat  . A fast heartbeat  . A bad rash all over body  . Dizziness and weakness   Immunizations Administered    Name Date Dose VIS Date Route   Pfizer COVID-19 Vaccine 08/28/2019 11:45 AM 0.3 mL 05/23/2019 Intramuscular   Manufacturer: [place]   Lot: [id number]   [place]: [id number]

## 2019-09-22 ENCOUNTER — Ambulatory Visit: Payer: Medicare Other | Attending: Internal Medicine

## 2019-09-22 DIAGNOSIS — Z23 Encounter for immunization: Secondary | ICD-10-CM

## 2019-09-22 NOTE — Progress Notes (Signed)
   Covid-19 Vaccination Clinic  Name:  Nathaniel [name]    MRN: [id number] DOB: 1946-01-14  09/22/2019  [name] was observed post Covid-19 immunization for 15 minutes without incident. He was provided with Vaccine Information Sheet and instruction to access the V-Safe system.   [name] was instructed to call 911 with any severe reactions post vaccine: Marland Kitchen Difficulty breathing  . Swelling of face and throat  . A fast heartbeat  . A bad rash all over body  . Dizziness and weakness   Immunizations Administered    Name Date Dose VIS Date Route   Pfizer COVID-19 Vaccine 09/22/2019 11:26 AM 0.3 mL 05/23/2019 Intramuscular   Manufacturer: [place]   Lot: [id number]   [place]: [id number]

## 2019-10-21 ENCOUNTER — Encounter: Payer: Self-pay | Admitting: Sports Medicine

## 2019-10-21 ENCOUNTER — Ambulatory Visit: Payer: Medicare Other | Admitting: Sports Medicine

## 2019-10-21 ENCOUNTER — Other Ambulatory Visit: Payer: Self-pay

## 2019-10-21 VITALS — BP 100/60 | Ht 68.0 in | Wt 170.0 lb

## 2019-10-21 DIAGNOSIS — I739 Peripheral vascular disease, unspecified: Secondary | ICD-10-CM

## 2019-10-21 DIAGNOSIS — M6281 Muscle weakness (generalized): Secondary | ICD-10-CM

## 2019-10-21 NOTE — Patient Instructions (Signed)
We are referring you to [facility] for hip adductor physical therapy.   We are referring you for ABIs of your legs to assess for vascular claudication.

## 2019-10-21 NOTE — Progress Notes (Signed)
    SUBJECTIVE:   CHIEF COMPLAINT / HPI:   Patient presents with 3 months of bilateral hip pain.  Right is greater than left.  Hurts to sleep on his side.  Describes it as an aching pain.  Does not bother him during the day.  Denies any injury to the area.  Pain does not radiate anywhere.  It localizes over the trochanteric and lateral buttock.  Patient also endorses a bilateral leg "heaviness" and "stiffness" after working out.  He typically rose about 3 miles.  The stiffness will occur the following day after working out.  Does not localize to any specific region of the leg.  Recent work-up included MRI lumbar spine that showed L2-L5 minimal foraminal stenosis, also some bulging disc.  Recent lab work includes ESR and CRP are within normal limits.  PERTINENT  PMH / PSH: Microvascular angina   OBJECTIVE:   BP 100/60   Ht '5\' 8"'$  (1.727 m)   Wt 170 lb (77.1 kg)   BMI 25.85 kg/m   General: Well-appearing  Hip Inspection: no bruising or effusion Palpation: Minimally tender on the right trochanteric region ROM: Normal flexion extension, lower back Strength: Weakness of right hip abductors Stability: Trendelenburg (hip drop on right side) Special tests: neg FADIR, FABER Vascular studies: 2+ distal pulses bilaterally   ASSESSMENT/PLAN:   Weakness in right hip abductors As evidenced on exam.  Symptoms should improve with strengthening exercises.  -If no improvement, consider rheumatologic type disease with steroid burst and referral to rheumatology -Physical therapy with [facility]  Bilateral leg stiffness Neurogenic versus ?Vascular claudication -ABIs to rule out vascular claudication  Marny Lowenstein, MD [place]   Patient seen and evaluated with the resident.  I agree with the above plan of care.  Patient has definite weakness of his pelvic stabilizers and hip abductors.  Recommend physical therapy consultation with [facility]. The etiology of  his bilateral lower extremity discomfort is not clear.  Review of his MRI of his lumbar spine does not suggest neurogenic claudication.  I would like to get some ABIs to rule out vascular claudication.  Phone follow-up with those results when available.  If unremarkable, I may have Dr. Ralene Ok try a short course of prednisone with a possible rheumatology referral if he has a favorable response.

## 2019-10-23 ENCOUNTER — Other Ambulatory Visit: Payer: Self-pay

## 2019-10-23 ENCOUNTER — Ambulatory Visit: Payer: Medicare Other | Attending: Sports Medicine | Admitting: Physical Therapy

## 2019-10-23 ENCOUNTER — Encounter: Payer: Self-pay | Admitting: Physical Therapy

## 2019-10-23 DIAGNOSIS — M25551 Pain in right hip: Secondary | ICD-10-CM

## 2019-10-23 DIAGNOSIS — M6281 Muscle weakness (generalized): Secondary | ICD-10-CM

## 2019-10-23 DIAGNOSIS — M25552 Pain in left hip: Secondary | ICD-10-CM | POA: Diagnosis present

## 2019-10-23 NOTE — Therapy (Signed)
[place], [place], [address] Phone: [phone]   Fax:  [phone]  Physical Therapy Evaluation  Patient Details  Name: Nathaniel [name] MRN: [id number] Date of Birth: 1946/03/18 Referring Provider (PT): Dr. Lilia Argue    Encounter Date: 10/23/2019  PT End of Session - 10/23/19 1430    Visit Number  1    Number of Visits  12    Date for PT Re-Evaluation  12/04/19    PT Start Time  1400    PT Stop Time  1458    PT Time Calculation (min)  58 min    Activity Tolerance  Patient tolerated treatment well    Behavior During Therapy  [facility] for tasks assessed/performed       Past Medical History:  Diagnosis Date  . Allergic rhinitis   . Anemia    pt unaware  . Anginal pain ([place])    Microvascular angina- followed by Dr Burt Knack  . Arthritis    back  . Cancer (HCC)    Basal cell -   . Coronary artery disease    nonobstructive  . DVT (deep venous thrombosis) ([place]) 03/2011   after prolonged travel  . GERD (gastroesophageal reflux disease)   . Headache    Vision Migraines  . Hiatal hernia    small  . History of rheumatic fever    questionable  . Hyperlipidemia   . Increased prostate specific antigen (PSA) velocity   . Laryngopharyngeal reflux   . Mitral valve prolapse   . Palpitations   . PE (pulmonary embolism) 03/2011   after prolonged travel  . PONV (postoperative nausea and vomiting)    Fentyl- N/V  . Pruritus    resolved  . Shortness of breath dyspnea    with Exertion  . Sinus bradycardia     Past Surgical History:  Procedure Laterality Date  . COLONOSCOPY    . COLONOSCOPY N/A 02/24/2015   Procedure: COLONOSCOPY;  Surgeon: Ronald Lobo, MD;  Location: [facility] ENDOSCOPY;  Service: Endoscopy;  Laterality: N/A;  . CYSTOSCOPY    . ESOPHAGOGASTRODUODENOSCOPY N/A 02/06/2013   Procedure: ESOPHAGOGASTRODUODENOSCOPY (EGD);  Surgeon: Cleotis Nipper, MD;  Location[facility] ENDOSCOPY;  Service:  Endoscopy;  Laterality: N/A;  . ESOPHAGOGASTRODUODENOSCOPY N/A 02/24/2015   Procedure: ESOPHAGOGASTRODUODENOSCOPY (EGD);  Surgeon: Ronald Lobo, MD;  Location: [facility] ENDOSCOPY;  Service: Endoscopy;  Laterality: N/A;  . HUMERUS FRACTURE SURGERY Right 05/2003  . Repair of an AC seperation of the left    . SHOULDER ARTHROSCOPY Left    A/C tear  . TONSILLECTOMY AND ADENOIDECTOMY      There were no vitals filed for this visit.   Subjective Assessment - 10/23/19 1415    Subjective  Patient presents to PT with intermittent chronic pain in hips as well as LE fatigue and vague discomfort.  He does not really know what the problem is.  He says his hips began to hurt back in February when he began to do more walking.  He also has noticced an onset of bilateral LE weakness and heaviness with AM activites. He reports he can do most of the things he needs to do but he is concerned as to what the cause is.    Pertinent History  hamstring tear, chronic cramping in LEs, R knee pain    Limitations  Walking;Lifting;Standing;House hold activities   sleeping (hips)   Diagnostic tests  MRI in 2015 : Mild lumbar spondylosis for age. Degenerative disc disease mostpronounced  at L4-L5 and L5-S1,    Patient Stated Goals  figure out what is wrong, stop worrying    Currently in Pain?  Yes    Pain Score  1    Feb   Pain Location  Hip    Pain Orientation  Right;Left;Posterior;Lateral    Pain Descriptors / Indicators  Sore;Aching;Pressure    Pain Type  Chronic pain    Pain Radiating Towards  buttock R>L , improving    Pain Onset  More than a month ago    Pain Frequency  Intermittent    Aggravating Factors   nothing in particular, feels 10-15 min after getting up , walking seems to increase as well. Hip: was difficult laying on side in bed    Pain Relieving Factors  pillows, props, sitting    Effect of Pain on Daily Activities  worries alot, sleep impacted    Multiple Pain Sites  Yes    Pain Score  1    Pain  Location  Leg    Pain Orientation  Right;Left   diffuse   Pain Descriptors / Indicators  Aching    Pain Type  Chronic pain    Pain Onset  More than a month ago    Pain Frequency  Intermittent    Aggravating Factors   AM    Pain Relieving Factors  as the day goes on.    Effect of Pain on Daily Activities  its wears me down         [facility] PT Assessment - 10/23/19 0001      Assessment   Medical Diagnosis  weakness , LE    Referring Provider (PT)  Dr. Lilia Argue     Onset Date/Surgical Date  --   chronic   Prior Therapy  Yes, 6 yrs ago       Precautions   Precautions  None      Restrictions   Weight Bearing Restrictions  No      Balance Screen   Has the patient fallen in the past 6 months  No    Has the patient had a decrease in activity level because of a fear of falling?   No    Is the patient reluctant to leave their home because of a fear of falling?   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 No Trendelenburg       Posture/Postural Control   Posture/Postural Control  Postural limitations    Postural Limitations  Rounded Shoulders;Forward head;Right pelvic obliquity;Flexed trunk    Posture Comments  high R hip and high L shoulder      AROM   Lumbar Flexion  touches toes , no pain  Lumbar Extension  25% pain L glute and thigh     Lumbar - Right Side Bend  dec 50% due to pelvic asymmetry      Lumbar - Left Side Bend  WNL    Lumbar - Right Rotation  25%    Lumbar - Left Rotation  25%       PROM   Overall PROM Comments  mild tightness in hip ER/IR      Strength   Right Hip Flexion  5/5    Right Hip Extension  4/5    Right Hip ABduction  4-/5    Left Hip Flexion  5/5    Left Hip Extension  4/5    Left Hip ABduction  4/5    Right Knee Flexion  4+/5    Right Knee Extension  5/5    Left Knee Flexion  4+/5    Left Knee Extension  5/5    Right/Left Ankle  --   DF WNL      Flexibility   Hamstrings  45 deg     Quadriceps  tight, tested prone     Piriformis  tight bilaterally       Palpation   Palpation comment  no pain in lumbar spine, mild tenderness in bilateral lateral hips, posterior to Gr Trochanter       Transfers   Five time sit to stand comments   9 sec     Comments  Can do x 20 with normal amount of LE fatigue, ,mild                   Objective measurements completed on examination: See above findings.              PT Education - 10/23/19 1609    Education Details  PT/POC, differential diagnosis, dry needling    Person(s) Educated  Patient    Methods  Explanation;Demonstration    Comprehension  Returned demonstration;Verbalized understanding          PT Long Term Goals - 10/23/19 1433      PT LONG TERM GOAL #1   Title  Pt will be able to report decreased LE fatigue, discomfort during midday hours,  minimal and </=3 days per week    Baseline  daily, varies 3/10-5/10    Time  6    Period  Weeks    Status  New    Target Date  12/04/19      PT LONG TERM GOAL #2   Title  Pt will be I with HEP for core, hip flexibility and strength    Time  6    Period  Weeks    Status  New    Target Date  12/04/19      PT LONG TERM GOAL #3   Title  Pt will be able to demonstrate corrected posture and maintain while lifting from the floor, squatting to preserve spine    Time  6    Period  Weeks    Status  New    Target Date  12/04/19       PT LONG TERM GOAL #4   Title  Pt will be able to increase hip abduction strength to 5/5 in for maximal stability with gait and LE/core support    Time  6    Period  Weeks    Status  New    Target Date  12/04/19  Plan - 10/23/19 1431    Clinical Impression Statement  Patient presents with mod complexity eval of LE discomfort, weakness which has been intermittent and varied for many years.  Symptoms are consistent with claudication vs spinal stenosis without signifcant pain in low back. He is able to do what he needs to do but is concerned about the length of time and often the severity of leg symptoms.  He is farily active but without a structured HEP.  It is worth trying to PT see if his LE symptoms can be decreased with flexion based core exercises and other modalities to allow him to return to full function.  Showed no Trendelenburg today but his pain was very minimal 1/10.    Personal Factors and Comorbidities  Age;Comorbidity 1;Past/Current Experience    Comorbidities  chronic hip pain, knee OA, angina, HTN, hyperlipidemia, DVT    Examination-Activity Limitations  Lift;Stand;Locomotion Level;Bed Mobility;Squat;Stairs;Sleep;Transfers    Examination-Participation Restrictions  Community Activity    Stability/Clinical Decision Making  Evolving/Moderate complexity    Clinical Decision Making  Moderate    Rehab Potential  Excellent    PT Frequency  2x / week    PT Duration  6 weeks    PT Treatment/Interventions  ADLs/Self Care Home Management;Cryotherapy;Electrical Stimulation;Moist Heat;Iontophoresis 4mg /ml Dexamethasone;Therapeutic activities;Functional mobility training;Therapeutic exercise;Balance training;Patient/family education;Manual techniques;Dry needling;Passive range of motion    PT Next Visit Plan  develop HEP. 6 min walk test, check LE symptoms, closed chain    PT Home Exercise Plan  none at this time    Consulted and Agree with Plan of Care  Patient        Patient will benefit from skilled therapeutic intervention in order to improve the following deficits and impairments:  Pain, Postural dysfunction, Decreased mobility, Decreased strength, Decreased range of motion, Impaired flexibility  Visit Diagnosis: Muscle weakness (generalized)  Pain in left hip  Pain in right hip     Problem List Patient Active Problem List   Diagnosis Date Noted  . Hoarseness 05/07/2018  . Globus pharyngeus 10/05/2017  . Hamstring strain, subsequent encounter 06/22/2016  . Benign prostatic hyperplasia with urinary obstruction 11/15/2015  . Sick sinus syndrome ([place]) 08/30/2015  . Palpitations 08/30/2015  . Plantar fasciitis of right foot 06/16/2015  . Pes planus of both feet 06/16/2015  . Lateral epicondylitis of right elbow 04/21/2014  . Chronic pelvic pain in male 12/08/2013  . Chronic prostatitis 12/08/2013  . Aortic insufficiency 01/31/2012  . Other pulmonary embolism and infarction 04/05/2011  . Long term (current) use of anticoagulants 04/05/2011  . Elevated prostate specific antigen (PSA) 03/13/2011  . OTHER AND UNSPECIFIED ANGINA PECTORIS 11/10/2008  . CAD, NATIVE VESSEL 11/10/2008  . HYPERLIPIDEMIA 11/07/2008  . MITRAL VALVE PROLAPSE 11/07/2008  . ALLERGIC RHINITIS 11/07/2008  . GERD 11/07/2008  . ANEMIA, HX OF 11/07/2008  . PALPITATIONS, HX OF 11/07/2008  . TONSILLECTOMY AND ADENOIDECTOMY, HX OF 11/07/2008    Dewey Neukam 10/23/2019, 9:05 PM  [place], [place], [address] Phone: [phone]   Fax:  [phone]  Name: KYLYNN SWARTZENTRUBER MRN: [id number] Date of Birth: April 06, 1946   Raeford Razor, PT 10/23/19 9:06 PM Phone: [phone] Fax: [phone]

## 2019-10-28 ENCOUNTER — Ambulatory Visit (HOSPITAL_COMMUNITY)
Admission: RE | Admit: 2019-10-28 | Discharge: 2019-10-28 | Disposition: A | Discharge status: Home / Self Care | Payer: Medicare Other | Source: Ambulatory Visit | Attending: Sports Medicine | Admitting: Sports Medicine

## 2019-10-28 ENCOUNTER — Other Ambulatory Visit: Payer: Self-pay

## 2019-10-28 DIAGNOSIS — I739 Peripheral vascular disease, unspecified: Secondary | ICD-10-CM

## 2019-10-28 NOTE — Progress Notes (Signed)
ABI's have been completed. Preliminary results can be found in CV Proc through chart review.   10/28/19 10:42 AM Nathaniel [name] RVT

## 2019-10-31 ENCOUNTER — Telehealth: Payer: Self-pay | Admitting: Sports Medicine

## 2019-10-31 NOTE — Telephone Encounter (Signed)
  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  They are normal.  We had previously discussed a short trial of steroids to see if that would improve his symptoms but he states that his symptoms are somewhat improved.  At this point he would like to take a watchful waiting approach and I think that that is perfectly acceptable.  I did encourage him to continue with some limited physical therapy to address the weakness found on his physical exam but otherwise we will leave things open-ended for him to follow-up as needed.

## 2019-11-05 ENCOUNTER — Encounter: Payer: Self-pay | Admitting: Physical Therapy

## 2019-11-05 ENCOUNTER — Other Ambulatory Visit: Payer: Self-pay

## 2019-11-05 ENCOUNTER — Ambulatory Visit: Payer: Medicare Other | Admitting: Physical Therapy

## 2019-11-05 DIAGNOSIS — M6281 Muscle weakness (generalized): Secondary | ICD-10-CM

## 2019-11-05 DIAGNOSIS — M25551 Pain in right hip: Secondary | ICD-10-CM

## 2019-11-05 DIAGNOSIS — M25552 Pain in left hip: Secondary | ICD-10-CM

## 2019-11-05 NOTE — Therapy (Signed)
[place], [place], [address] Phone: [phone]   Fax:  [phone]  Physical Therapy Treatment  Patient Details  Name: Nathaniel [name] MRN: [id number] Date of Birth: Oct 30, 1945 Referring Provider (PT): Dr. Lilia Argue    Encounter Date: 11/05/2019  PT End of Session - 11/05/19 1422    Visit Number  2    Number of Visits  12    Date for PT Re-Evaluation  12/04/19    Authorization Type  [employer] Medicare    PT Start Time  [id number]    PT Stop Time  1505    PT Time Calculation (min)  43 min    Activity Tolerance  Patient tolerated treatment well    Behavior During Therapy  [facility] for tasks assessed/performed       Past Medical History:  Diagnosis Date  . Allergic rhinitis   . Anemia    pt unaware  . Anginal pain ([place])    Microvascular angina- followed by Dr Burt Knack  . Arthritis    back  . Cancer (HCC)    Basal cell -   . Coronary artery disease    nonobstructive  . DVT (deep venous thrombosis) ([place]) 03/2011   after prolonged travel  . GERD (gastroesophageal reflux disease)   . Headache    Vision Migraines  . Hiatal hernia    small  . History of rheumatic fever    questionable  . Hyperlipidemia   . Increased prostate specific antigen (PSA) velocity   . Laryngopharyngeal reflux   . Mitral valve prolapse   . Palpitations   . PE (pulmonary embolism) 03/2011   after prolonged travel  . PONV (postoperative nausea and vomiting)    Fentyl- N/V  . Pruritus    resolved  . Shortness of breath dyspnea    with Exertion  . Sinus bradycardia     Past Surgical History:  Procedure Laterality Date  . COLONOSCOPY    . COLONOSCOPY N/A 02/24/2015   Procedure: COLONOSCOPY;  Surgeon: Ronald Lobo, MD;  Location: [facility] ENDOSCOPY;  Service: Endoscopy;  Laterality: N/A;  . CYSTOSCOPY    . ESOPHAGOGASTRODUODENOSCOPY N/A 02/06/2013   Procedure: ESOPHAGOGASTRODUODENOSCOPY (EGD);  Surgeon: Cleotis Nipper, MD;   Location: [facility] ENDOSCOPY;  Service: Endoscopy;  Laterality: N/A;  . ESOPHAGOGASTRODUODENOSCOPY N/A 02/24/2015   Procedure: ESOPHAGOGASTRODUODENOSCOPY (EGD);  Surgeon: Ronald Lobo, MD;  Location: [facility] ENDOSCOPY;  Service: Endoscopy;  Laterality: N/A;  . HUMERUS FRACTURE SURGERY Right 05/2003  . Repair of an AC seperation of the left    . SHOULDER ARTHROSCOPY Left    A/C tear  . TONSILLECTOMY AND ADENOIDECTOMY      There were no vitals filed for this visit.  Subjective Assessment - 11/05/19 1440    Subjective  Pt. returns for first follow up session since initial eval. He had ABI testing for potential LE vascular etiology which came back (-). He reports that his hip pain has improved with use of softer mattress/mattress pad since last visit but still noting some diffuse LE symptoms with weakness and fatigue. See copy of updated MRI report with findings of moderate stenosis. He also notes some recent left hamstring cramping/tightness as well as some adductor pain as well noted with doing rowing exercise.    Pertinent History  hamstring tear, chronic cramping in LEs, R knee pain    Limitations  Walking;Lifting;Standing;House hold activities    Diagnostic tests  MRI 09/26/19-see chart copy of report    Currently in  Pain?  No/denies         [facility] PT Assessment - 11/05/19 0001      6 Minute Walk- Baseline   6 Minute Walk- Baseline  no      6 minute walk test results    Aerobic Endurance Distance Walked  1275                    [facility] Adult PT Treatment/Exercise - 11/05/19 0001      Exercises   Exercises  Lumbar;Knee/Hip      Lumbar Exercises: Stretches   Single Knee to Chest Stretch  Right;Left;5 reps    Single Knee to Chest Stretch Limitations  5-10 sec holds    Double Knee to Chest Stretch  5 reps    Double Knee to Chest Stretch Limitations  HEP alternative to [facility]      Lumbar Exercises: Supine   Pelvic Tilt  10 reps      Knee/Hip Exercises: Stretches   Other  Knee/Hip Stretches  HEP instruction and practice glut stretch      Knee/Hip Exercises: Standing   Functional Squat Limitations  HEP demo and practice squat at counter with bilat. UE support      Knee/Hip Exercises: Supine   Bridges Limitations  5 reps for HEP practice    Other Supine Knee/Hip Exercises  clamshell 10 reps with blue band             PT Education - 11/05/19 1512    Education Details  HEP, potential symptom etiology/differential diagnosis    Person(s) Educated  Patient    Methods  Explanation;Demonstration;Verbal cues;Tactile cues;Handout    Comprehension  Returned demonstration;Verbalized understanding          PT Long Term Goals - 10/23/19 1433      PT LONG TERM GOAL #1   Title  Pt will be able to report decreased LE fatigue, discomfort during midday hours,  minimal and </=3 days per week    Baseline  daily, varies 3/10-5/10    Time  6    Period  Weeks    Status  New    Target Date  12/04/19      PT LONG TERM GOAL #2   Title  Pt will be I with HEP for core, hip flexibility and strength    Time  6    Period  Weeks    Status  New    Target Date  12/04/19      PT LONG TERM GOAL #3   Title  Pt will be able to demonstrate corrected posture and maintain while lifting from the floor, squatting to preserve spine    Time  6    Period  Weeks    Status  New    Target Date  12/04/19      PT LONG TERM GOAL #4   Title  Pt will be able to increase hip abduction strength to 5/5 in for maximal stability with gait and LE/core support    Time  6    Period  Weeks    Status  New    Target Date  12/04/19            Plan - 11/05/19 1513    Clinical Impression Statement  Hip improved from status at eval with minimal current symptoms so held direct tx. emphasis to this area/no dry needling today per pt. request. Tx. focus establishing HEP for flexion bias lumbar ROM and LE/core stabilization and  strengthening with (flexion bias ROM) for potential contribution  of stenosis to leg symptoms. Pt. returned demos correctly with min-mod cues and will await further tx. response/status by next visit re: symptoms.    Personal Factors and Comorbidities  Age;Comorbidity 1;Past/Current Experience    Comorbidities  chronic hip pain, knee OA, angina, HTN, hyperlipidemia, DVT    Examination-Activity Limitations  Lift;Stand;Locomotion Level;Bed Mobility;Squat;Stairs;Sleep;Transfers    Examination-Participation Restrictions  Community Activity    Stability/Clinical Decision Making  Evolving/Moderate complexity    Clinical Decision Making  Moderate    Rehab Potential  Excellent    PT Frequency  2x / week    PT Duration  6 weeks    PT Treatment/Interventions  ADLs/Self Care Home Management;Cryotherapy;Electrical Stimulation;Moist Heat;Iontophoresis 4mg /ml Dexamethasone;Therapeutic activities;Functional mobility training;Therapeutic exercise;Balance training;Patient/family education;Manual techniques;Dry needling;Passive range of motion    PT Next Visit Plan  review HEP as needed, continue LE strengthening, stretches, flexion bias ROM/core stabilization progression, add hamstring stretch and potential foam roll left hamstring, address lateral hip region prn pending symptoms    PT Home Exercise Plan  [id number]    Consulted and Agree with Plan of Care  Patient       Patient will benefit from skilled therapeutic intervention in order to improve the following deficits and impairments:  Pain, Postural dysfunction, Decreased mobility, Decreased strength, Decreased range of motion, Impaired flexibility  Visit Diagnosis: Muscle weakness (generalized)  Pain in left hip  Pain in right hip     Problem List Patient Active Problem List   Diagnosis Date Noted  . Hoarseness 05/07/2018  . Globus pharyngeus 10/05/2017  . Hamstring strain, subsequent encounter 06/22/2016  . Benign prostatic hyperplasia with urinary obstruction 11/15/2015  . Sick sinus syndrome ([place])  08/30/2015  . Palpitations 08/30/2015  . Plantar fasciitis of right foot 06/16/2015  . Pes planus of both feet 06/16/2015  . Lateral epicondylitis of right elbow 04/21/2014  . Chronic pelvic pain in male 12/08/2013  . Chronic prostatitis 12/08/2013  . Aortic insufficiency 01/31/2012  . Other pulmonary embolism and infarction 04/05/2011  . Long term (current) use of anticoagulants 04/05/2011  . Elevated prostate specific antigen (PSA) 03/13/2011  . OTHER AND UNSPECIFIED ANGINA PECTORIS 11/10/2008  . CAD, NATIVE VESSEL 11/10/2008  . HYPERLIPIDEMIA 11/07/2008  . MITRAL VALVE PROLAPSE 11/07/2008  . ALLERGIC RHINITIS 11/07/2008  . GERD 11/07/2008  . ANEMIA, HX OF 11/07/2008  . PALPITATIONS, HX OF 11/07/2008  . TONSILLECTOMY AND ADENOIDECTOMY, HX OF 11/07/2008    Beaulah Dinning, PT, DPT 11/05/19 3:18 PM  [facility] [address] [place], [place], [address] Phone: [phone]   Fax:  [phone]  Name: Nathaniel [name] MRN: [id number] Date of Birth: 11-04-1945

## 2019-11-12 ENCOUNTER — Other Ambulatory Visit: Payer: Self-pay

## 2019-11-12 ENCOUNTER — Encounter: Payer: Self-pay | Admitting: Physical Therapy

## 2019-11-12 ENCOUNTER — Ambulatory Visit: Payer: Medicare Other | Attending: Sports Medicine | Admitting: Physical Therapy

## 2019-11-12 DIAGNOSIS — M6281 Muscle weakness (generalized): Secondary | ICD-10-CM | POA: Diagnosis present

## 2019-11-12 DIAGNOSIS — M25552 Pain in left hip: Secondary | ICD-10-CM | POA: Diagnosis present

## 2019-11-12 DIAGNOSIS — M25551 Pain in right hip: Secondary | ICD-10-CM | POA: Diagnosis present

## 2019-11-12 NOTE — Therapy (Signed)
[place], [place], [address] Phone: [phone]   Fax:  [phone]  Physical Therapy Treatment  Patient Details  Name: Nathaniel [name] MRN: [id number] Date of Birth: 02/15/1946 Referring Provider (PT): Dr. Lilia Argue    Encounter Date: 11/12/2019  PT End of Session - 11/12/19 1240    Visit Number  3    Number of Visits  12    Date for PT Re-Evaluation  12/04/19    Authorization Type  UHC Medicare    PT Start Time  1022    PT Stop Time  1101    PT Time Calculation (min)  39 min    Activity Tolerance  Patient tolerated treatment well    Behavior During Therapy  [facility] for tasks assessed/performed       Past Medical History:  Diagnosis Date  . Allergic rhinitis   . Anemia    pt unaware  . Anginal pain ([place])    Microvascular angina- followed by Dr Burt Knack  . Arthritis    back  . Cancer (HCC)    Basal cell -   . Coronary artery disease    nonobstructive  . DVT (deep venous thrombosis) ([place]) 03/2011   after prolonged travel  . GERD (gastroesophageal reflux disease)   . Headache    Vision Migraines  . Hiatal hernia    small  . History of rheumatic fever    questionable  . Hyperlipidemia   . Increased prostate specific antigen (PSA) velocity   . Laryngopharyngeal reflux   . Mitral valve prolapse   . Palpitations   . PE (pulmonary embolism) 03/2011   after prolonged travel  . PONV (postoperative nausea and vomiting)    Fentyl- N/V  . Pruritus    resolved  . Shortness of breath dyspnea    with Exertion  . Sinus bradycardia     Past Surgical History:  Procedure Laterality Date  . COLONOSCOPY    . COLONOSCOPY N/A 02/24/2015   Procedure: COLONOSCOPY;  Surgeon: Ronald Lobo, MD;  Location[facility] ENDOSCOPY;  Service: Endoscopy;  Laterality: N/A;  . CYSTOSCOPY    . ESOPHAGOGASTRODUODENOSCOPY N/A 02/06/2013   Procedure: ESOPHAGOGASTRODUODENOSCOPY (EGD);  Surgeon: Cleotis Nipper, MD;   Location: [facility] ENDOSCOPY;  Service: Endoscopy;  Laterality: N/A;  . ESOPHAGOGASTRODUODENOSCOPY N/A 02/24/2015   Procedure: ESOPHAGOGASTRODUODENOSCOPY (EGD);  Surgeon: Ronald Lobo, MD;  Location[facility] ENDOSCOPY;  Service: Endoscopy;  Laterality: N/A;  . HUMERUS FRACTURE SURGERY Right 05/2003  . Repair of an AC seperation of the left    . SHOULDER ARTHROSCOPY Left    A/C tear  . TONSILLECTOMY AND ADENOIDECTOMY      There were no vitals filed for this visit.  Subjective Assessment - 11/12/19 1028    Subjective  Mild hip soreness but overall doing better with both hips and LE radiating pain-unclear if benefit associated with exercises/tx. or just symptom resolution with more time.    Pertinent History  hamstring tear, chronic cramping in LEs, R knee pain    Currently in Pain?  No/denies                        [facility] Adult PT Treatment/Exercise - 11/12/19 0001      Lumbar Exercises: Stretches   Active Hamstring Stretch  Right;Left;2 reps;30 seconds      Knee/Hip Exercises: Standing   Hip Abduction  AROM;Stengthening;Both;2 sets;10 reps;Knee straight    Abduction Limitations  green band proximal to  knees    Forward Step Up  Right;Left;2 sets;10 reps;Hand Hold: 1;Step Height: 8"    Other Standing Knee Exercises  hip hike from 4 in. step 2x10 ea. bilat.-cues to keep stance knee extended      Manual Therapy   Manual Therapy  Soft tissue mobilization    Soft tissue mobilization  IASTM/foam roll bilateral hamstrings and gluts in prone over pillow             PT Education - 11/12/19 1208    Education Details  HEP review/update, POC          PT Long Term Goals - 10/23/19 1433      PT LONG TERM GOAL #1   Title  Pt will be able to report decreased LE fatigue, discomfort during midday hours,  minimal and </=3 days per week    Baseline  daily, varies 3/10-5/10    Time  6    Period  Weeks    Status  New    Target Date  12/04/19      PT LONG TERM GOAL #2   Title   Pt will be I with HEP for core, hip flexibility and strength    Time  6    Period  Weeks    Status  New    Target Date  12/04/19      PT LONG TERM GOAL #3   Title  Pt will be able to demonstrate corrected posture and maintain while lifting from the floor, squatting to preserve spine    Time  6    Period  Weeks    Status  New    Target Date  12/04/19      PT LONG TERM GOAL #4   Title  Pt will be able to increase hip abduction strength to 5/5 in for maximal stability with gait and LE/core support    Time  6    Period  Weeks    Status  New    Target Date  12/04/19            Plan - 11/12/19 1240    Clinical Impression Statement  Symptoms for both bilateral hip pain and LE radiating symptoms improving from prevoius status with functional gains including for positional tolerance for sidelying. Focused more on standing exercises today with closed chain exercise progress with some muscle fatigue but overall well-tolerated with minimal symptoms. Added manual work with foam roll to posterior chain muscles due to pt. c/o tightness with limited tolerance for stretches at times. Plan continue POC for now but if progress continues with symptom resolution potential early d/c in the next several visits.    Personal Factors and Comorbidities  Age;Comorbidity 1;Past/Current Experience    Comorbidities  chronic hip pain, knee OA, angina, HTN, hyperlipidemia, DVT    Examination-Activity Limitations  Lift;Stand;Locomotion Level;Bed Mobility;Squat;Stairs;Sleep;Transfers    Examination-Participation Restrictions  Community Activity    Stability/Clinical Decision Making  Evolving/Moderate complexity    Clinical Decision Making  Moderate    Rehab Potential  Excellent    PT Frequency  2x / week    PT Duration  6 weeks    PT Treatment/Interventions  ADLs/Self Care Home Management;Cryotherapy;Electrical Stimulation;Moist Heat;Iontophoresis 4mg /ml Dexamethasone;Therapeutic activities;Functional mobility  training;Therapeutic exercise;Balance training;Patient/family education;Manual techniques;Dry needling;Passive range of motion    PT Next Visit Plan  review HEP as needed, continue LE strengthening, stretches, flexion bias ROM/core stabilization and hip strengthening progression, further manual as needed    PT Home Exercise Plan  [email or web address]    Consulted and Agree with Plan of Care  Patient       Patient will benefit from skilled therapeutic intervention in order to improve the following deficits and impairments:  Pain, Postural dysfunction, Decreased mobility, Decreased strength, Decreased range of motion, Impaired flexibility  Visit Diagnosis: Muscle weakness (generalized)  Pain in left hip  Pain in right hip     Problem List Patient Active Problem List   Diagnosis Date Noted  . Hoarseness 05/07/2018  . Globus pharyngeus 10/05/2017  . Hamstring strain, subsequent encounter 06/22/2016  . Benign prostatic hyperplasia with urinary obstruction 11/15/2015  . Sick sinus syndrome ([place]) 08/30/2015  . Palpitations 08/30/2015  . Plantar fasciitis of right foot 06/16/2015  . Pes planus of both feet 06/16/2015  . Lateral epicondylitis of right elbow 04/21/2014  . Chronic pelvic pain in male 12/08/2013  . Chronic prostatitis 12/08/2013  . Aortic insufficiency 01/31/2012  . Other pulmonary embolism and infarction 04/05/2011  . Long term (current) use of anticoagulants 04/05/2011  . Elevated prostate specific antigen (PSA) 03/13/2011  . OTHER AND UNSPECIFIED ANGINA PECTORIS 11/10/2008  . CAD, NATIVE VESSEL 11/10/2008  . HYPERLIPIDEMIA 11/07/2008  . MITRAL VALVE PROLAPSE 11/07/2008  . ALLERGIC RHINITIS 11/07/2008  . GERD 11/07/2008  . ANEMIA, HX OF 11/07/2008  . PALPITATIONS, HX OF 11/07/2008  . TONSILLECTOMY AND ADENOIDECTOMY, HX OF 11/07/2008    Beaulah Dinning, PT, DPT 11/12/19 12:45 PM  [facility] [address] [place], [place], [address] Phone: [phone]   Fax:  [phone]  Name: Nathaniel [name] MRN: [id number] Date of Birth: 05/22/1946

## 2019-11-17 ENCOUNTER — Ambulatory Visit: Payer: Medicare Other | Admitting: Physical Therapy

## 2019-11-17 ENCOUNTER — Encounter: Payer: Self-pay | Admitting: Physical Therapy

## 2019-11-17 ENCOUNTER — Other Ambulatory Visit: Payer: Self-pay

## 2019-11-17 DIAGNOSIS — M6281 Muscle weakness (generalized): Secondary | ICD-10-CM | POA: Diagnosis not present

## 2019-11-17 DIAGNOSIS — M25552 Pain in left hip: Secondary | ICD-10-CM

## 2019-11-17 DIAGNOSIS — M25551 Pain in right hip: Secondary | ICD-10-CM

## 2019-11-17 NOTE — Therapy (Signed)
[place], [place], [address] Phone: [phone]   Fax:  [phone]  Physical Therapy Treatment  Patient Details  Name: Nathaniel [name] MRN: [id number] Date of Birth: 03/14/1946 Referring Provider (PT): Dr. Lilia Argue    Encounter Date: 11/17/2019  PT End of Session - 11/17/19 1417    Visit Number  4    Number of Visits  12    Date for PT Re-Evaluation  12/04/19    Authorization Type  UHC Medicare    PT Start Time  1135    PT Stop Time  1220    PT Time Calculation (min)  45 min    Activity Tolerance  Patient tolerated treatment well    Behavior During Therapy  [facility] for tasks assessed/performed       Past Medical History:  Diagnosis Date  . Allergic rhinitis   . Anemia    pt unaware  . Anginal pain ([place])    Microvascular angina- followed by Dr Burt Knack  . Arthritis    back  . Cancer (HCC)    Basal cell -   . Coronary artery disease    nonobstructive  . DVT (deep venous thrombosis) ([place]) 03/2011   after prolonged travel  . GERD (gastroesophageal reflux disease)   . Headache    Vision Migraines  . Hiatal hernia    small  . History of rheumatic fever    questionable  . Hyperlipidemia   . Increased prostate specific antigen (PSA) velocity   . Laryngopharyngeal reflux   . Mitral valve prolapse   . Palpitations   . PE (pulmonary embolism) 03/2011   after prolonged travel  . PONV (postoperative nausea and vomiting)    Fentyl- N/V  . Pruritus    resolved  . Shortness of breath dyspnea    with Exertion  . Sinus bradycardia     Past Surgical History:  Procedure Laterality Date  . COLONOSCOPY    . COLONOSCOPY N/A 02/24/2015   Procedure: COLONOSCOPY;  Surgeon: Ronald Lobo, MD;  Location: [facility] ENDOSCOPY;  Service: Endoscopy;  Laterality: N/A;  . CYSTOSCOPY    . ESOPHAGOGASTRODUODENOSCOPY N/A 02/06/2013   Procedure: ESOPHAGOGASTRODUODENOSCOPY (EGD);  Surgeon: Cleotis Nipper, MD;   Location[facility] ENDOSCOPY;  Service: Endoscopy;  Laterality: N/A;  . ESOPHAGOGASTRODUODENOSCOPY N/A 02/24/2015   Procedure: ESOPHAGOGASTRODUODENOSCOPY (EGD);  Surgeon: Ronald Lobo, MD;  Location: [facility] ENDOSCOPY;  Service: Endoscopy;  Laterality: N/A;  . HUMERUS FRACTURE SURGERY Right 05/2003  . Repair of an AC seperation of the left    . SHOULDER ARTHROSCOPY Left    A/C tear  . TONSILLECTOMY AND ADENOIDECTOMY      There were no vitals filed for this visit.  Subjective Assessment - 11/17/19 1140    Subjective  Has not had pain in >1 week.  He is not aware of any weakness.  Has begun to swing the golf club and cont to row.         [facility] PT Assessment - 11/17/19 0001      Strength   Right Hip Flexion  5/5    Left Hip Flexion  5/5    Right Knee Flexion  5/5    Right Knee Extension  5/5    Left Knee Flexion  5/5    Left Knee Extension  5/5                    [facility] Adult PT Treatment/Exercise - 11/17/19 0001  Self-Care   Self-Care  Other Self-Care Comments    Other Self-Care Comments   see education       Lumbar Exercises: Stretches   Active Hamstring Stretch  Right;Left;2 reps;30 seconds    Single Knee to Chest Stretch  2 reps;30 seconds    Single Knee to Chest Stretch Limitations  add rotation for glute med.     Pelvic Tilt  10 reps      Lumbar Exercises: Standing   Functional Squats  15 reps    Functional Squats Limitations  mod to max cues for form       Lumbar Exercises: Supine   Bridge  10 reps    Bridge Limitations  articulating with max cues       Lumbar Exercises: Quadruped   Plank  primal push up x 20 sec x 3, standing plank with shoulder taps              PT Education - 11/17/19 1416    Education Details  hip hinge, squatting, form and exercise corrections, breathing/core    Person(s) Educated  Patient    Methods  Explanation;Demonstration    Comprehension  Verbalized understanding;Need further instruction;Verbal cues required;Tactile  cues required          PT Long Term Goals - 10/23/19 1433      PT LONG TERM GOAL #1   Title  Pt will be able to report decreased LE fatigue, discomfort during midday hours,  minimal and </=3 days per week    Baseline  daily, varies 3/10-5/10    Time  6    Period  Weeks    Status  New    Target Date  12/04/19      PT LONG TERM GOAL #2   Title  Pt will be I with HEP for core, hip flexibility and strength    Time  6    Period  Weeks    Status  New    Target Date  12/04/19      PT LONG TERM GOAL #3   Title  Pt will be able to demonstrate corrected posture and maintain while lifting from the floor, squatting to preserve spine    Time  6    Period  Weeks    Status  New    Target Date  12/04/19      PT LONG TERM GOAL #4   Title  Pt will be able to increase hip abduction strength to 5/5 in for maximal stability with gait and LE/core support    Time  6    Period  Weeks    Status  New    Target Date  12/04/19            Plan - 11/17/19 1412    Clinical Impression Statement  Patient has essentially been pain free and returning to normal activities. He has 5/5 strength in LE today measured in sitting.  In reviewing exercises he still had some questions and needed cues for many of the exercises, including squats and rationale for the exercises.  He wil come to 1 more session and then be discharged.    PT Treatment/Interventions  ADLs/Self Care Home Management;Cryotherapy;Electrical Stimulation;Moist Heat;Iontophoresis 4mg /ml Dexamethasone;Therapeutic activities;Functional mobility training;Therapeutic exercise;Balance training;Patient/family education;Manual techniques;Dry needling;Passive range of motion    PT Next Visit Plan  test hip abd , review post pelvic tilt, bridge and plank. Finalize and DC, [place].    PT Home Exercise Plan  [email or web address]  Consulted and Agree with Plan of Care  Patient       Patient will benefit from skilled therapeutic intervention in order to improve  the following deficits and impairments:  Pain, Postural dysfunction, Decreased mobility, Decreased strength, Decreased range of motion, Impaired flexibility  Visit Diagnosis: Muscle weakness (generalized)  Pain in left hip  Pain in right hip     Problem List Patient Active Problem List   Diagnosis Date Noted  . Hoarseness 05/07/2018  . Globus pharyngeus 10/05/2017  . Hamstring strain, subsequent encounter 06/22/2016  . Benign prostatic hyperplasia with urinary obstruction 11/15/2015  . Sick sinus syndrome ([place]) 08/30/2015  . Palpitations 08/30/2015  . Plantar fasciitis of right foot 06/16/2015  . Pes planus of both feet 06/16/2015  . Lateral epicondylitis of right elbow 04/21/2014  . Chronic pelvic pain in male 12/08/2013  . Chronic prostatitis 12/08/2013  . Aortic insufficiency 01/31/2012  . Other pulmonary embolism and infarction 04/05/2011  . Long term (current) use of anticoagulants 04/05/2011  . Elevated prostate specific antigen (PSA) 03/13/2011  . OTHER AND UNSPECIFIED ANGINA PECTORIS 11/10/2008  . CAD, NATIVE VESSEL 11/10/2008  . HYPERLIPIDEMIA 11/07/2008  . MITRAL VALVE PROLAPSE 11/07/2008  . ALLERGIC RHINITIS 11/07/2008  . GERD 11/07/2008  . ANEMIA, HX OF 11/07/2008  . PALPITATIONS, HX OF 11/07/2008  . TONSILLECTOMY AND ADENOIDECTOMY, HX OF 11/07/2008    Monnica Saltsman 11/17/2019, 2:28 PM  [facility] [address] [place], [place], [address] Phone: [phone]   Fax:  [phone]  Name: Nathaniel [name] MRN: [id number] Date of Birth: 1946/04/01  Raeford Razor, PT 11/17/19 2:29 PM Phone: [phone] Fax: [phone]

## 2019-11-17 NOTE — Patient Instructions (Signed)
Access Code: [email or web address]: [email or web address]: 06/07/2021Prepared by: Anderson Malta PaaExercises  Supine Hamstring Stretch with Strap - 2 x daily - 7 x weekly - 1 sets - 5 reps - 30 hold  Hooklying Single Knee to Chest Stretch - 2 x daily - 7 x weekly - 1-2 sets - 10 reps - 5-10 seconds hold  Supine Gluteus Stretch - 2 x daily - 7 x weekly - 1 sets - 2-3 reps - 20-30 seconds hold  Supine Posterior Pelvic Tilt - 2 x daily - 7 x weekly - 2 sets - 10 reps - 5 seconds hold  Pilates Bridge - 2 x daily - 7 x weekly - 2 sets - 10 reps - 5 hold  Hooklying Clamshell with Resistance - 1 x daily - 7 x weekly - 2-3 sets - 10 reps  Mini Squat with Counter Support - 1 x daily - 7 x weekly - 2-3 sets - 10 reps  Seated Flexion Stretch - 2 x daily - 7 x weekly - 1-2 sets - 10 reps - 5-10 seconds hold  Standing Hip Abduction with Resistance at Ankles and Counter Support - 1 x daily - 7 x weekly - 2-3 sets - 10 reps  Hip Hiking on Step - 1 x daily - 7 x weekly - 2-3 sets - 10 reps  Runner's Step Up/Down - 1 x daily - 7 x weekly - 2-3 sets - 10 reps  Primal Push Up - 7 x weekly - 1 sets - 5 reps - 30 hold

## 2019-11-18 ENCOUNTER — Ambulatory Visit: Payer: Medicare Other | Admitting: Sports Medicine

## 2019-11-19 ENCOUNTER — Ambulatory Visit: Payer: Medicare Other | Admitting: Physical Therapy

## 2019-11-24 ENCOUNTER — Encounter: Payer: Medicare Other | Admitting: Physical Therapy

## 2019-11-27 ENCOUNTER — Ambulatory Visit: Payer: Medicare Other | Admitting: Physical Therapy

## 2019-12-02 ENCOUNTER — Ambulatory Visit: Payer: Medicare Other | Admitting: Physical Therapy

## 2019-12-23 ENCOUNTER — Other Ambulatory Visit: Payer: Self-pay

## 2019-12-23 ENCOUNTER — Encounter: Payer: Self-pay | Admitting: Physical Therapy

## 2019-12-23 ENCOUNTER — Ambulatory Visit: Payer: Medicare Other | Attending: Sports Medicine | Admitting: Physical Therapy

## 2019-12-23 DIAGNOSIS — M6281 Muscle weakness (generalized): Secondary | ICD-10-CM

## 2019-12-23 DIAGNOSIS — M25551 Pain in right hip: Secondary | ICD-10-CM

## 2019-12-23 DIAGNOSIS — M25552 Pain in left hip: Secondary | ICD-10-CM

## 2019-12-23 NOTE — Therapy (Signed)
[place], [place], [address] Phone: [phone]   Fax:  [phone]  Physical Therapy Treatment/Discharge  Patient Details  Name: Nathaniel [name] MRN: [id number] Date of Birth: 1946-04-08 Referring Provider (PT): Dr. Lilia Argue    Encounter Date: 12/23/2019   PT End of Session - 12/23/19 1731    Visit Number 5    Number of Visits 12    Authorization Type UHC Medicare    PT Start Time 1450    PT Stop Time 1530    PT Time Calculation (min) 40 min    Activity Tolerance Patient tolerated treatment well    Behavior During Therapy [facility] for tasks assessed/performed           Past Medical History:  Diagnosis Date  . Allergic rhinitis   . Anemia    pt unaware  . Anginal pain ([place])    Microvascular angina- followed by Dr Burt Knack  . Arthritis    back  . Cancer (HCC)    Basal cell -   . Coronary artery disease    nonobstructive  . DVT (deep venous thrombosis) ([place]) 03/2011   after prolonged travel  . GERD (gastroesophageal reflux disease)   . Headache    Vision Migraines  . Hiatal hernia    small  . History of rheumatic fever    questionable  . Hyperlipidemia   . Increased prostate specific antigen (PSA) velocity   . Laryngopharyngeal reflux   . Mitral valve prolapse   . Palpitations   . PE (pulmonary embolism) 03/2011   after prolonged travel  . PONV (postoperative nausea and vomiting)    Fentyl- N/V  . Pruritus    resolved  . Shortness of breath dyspnea    with Exertion  . Sinus bradycardia     Past Surgical History:  Procedure Laterality Date  . COLONOSCOPY    . COLONOSCOPY N/A 02/24/2015   Procedure: COLONOSCOPY;  Surgeon: Ronald Lobo, MD;  Location: [facility] ENDOSCOPY;  Service: Endoscopy;  Laterality: N/A;  . CYSTOSCOPY    . ESOPHAGOGASTRODUODENOSCOPY N/A 02/06/2013   Procedure: ESOPHAGOGASTRODUODENOSCOPY (EGD);  Surgeon: Cleotis Nipper, MD;  Location: [facility] ENDOSCOPY;  Service:  Endoscopy;  Laterality: N/A;  . ESOPHAGOGASTRODUODENOSCOPY N/A 02/24/2015   Procedure: ESOPHAGOGASTRODUODENOSCOPY (EGD);  Surgeon: Ronald Lobo, MD;  Location: [facility] ENDOSCOPY;  Service: Endoscopy;  Laterality: N/A;  . HUMERUS FRACTURE SURGERY Right 05/2003  . Repair of an AC seperation of the left    . SHOULDER ARTHROSCOPY Left    A/C tear  . TONSILLECTOMY AND ADENOIDECTOMY      There were no vitals filed for this visit.   Subjective Assessment - 12/23/19 1455    Subjective He hasn't had a problem with his Rt hip.  The achiness in my legs is also intermittent. Rowed a week ago.  Swung the golf club a week ago.    Currently in Pain? No/denies              [facility] PT Assessment - 12/23/19 0001      AROM   Lumbar Flexion distal shin     Lumbar Extension 25%    Lumbar - Right Side Bend stiff    Lumbar - Left Side Bend stiff    Lumbar - Right Rotation 25%    Lumbar - Left Rotation 25%       Strength   Right Hip ABduction 4+/5    Left Hip ABduction 4+/5  [facility] Adult PT Treatment/Exercise - 12/23/19 0001      Self-Care   Other Self-Care Comments  verbal review of HEP, general squat mechanics, dead lift       Lumbar Exercises: Stretches   Passive Hamstring Stretch 2 reps;30 seconds    Single Knee to Chest Stretch 2 reps;30 seconds    Lower Trunk Rotation Limitations 10 x 10     Pelvic Tilt 10 reps      Lumbar Exercises: Supine   Bridge 10 reps    Bridge Limitations articulating with max cues    green band    Bridge with clamshell 10 reps      Knee/Hip Exercises: Seated   Sit to [employer] 20 reps;without UE support   banded                  PT Education - 12/23/19 1731    Education Details discharge, HEP    Person(s) Educated Patient    Methods Explanation    Comprehension Verbalized understanding               PT Long Term Goals - 12/23/19 1457      PT LONG TERM GOAL #1   Title Pt will be able to report decreased LE fatigue,  discomfort during midday hours,  minimal and </=3 days per week    Baseline varies, but seems less    Status Achieved      PT LONG TERM GOAL #2   Title Pt will be I with HEP for core, hip flexibility and strength    Status Achieved      PT LONG TERM GOAL #3   Title Pt will be able to demonstrate corrected posture and maintain while lifting from the floor, squatting to preserve spine    Status Achieved      PT LONG TERM GOAL #4   Title Pt will be able to increase hip abduction strength to 5/5 in for maximal stability with gait and LE/core support    Baseline 4+/5    Status Not Met                 Plan - 12/23/19 1732    Clinical Impression Statement Timmothy Sours has been doing fine since last visit.  Pain in hip is no longer and issue. He complains of vague fatigue on some days in legs not related to activity that he can tell.  He had essentially no limitations per FOTO.  Recommend he continue working on flexibility and continue his normal daily activities.    PT Treatment/Interventions ADLs/Self Care Home Management;Cryotherapy;Electrical Stimulation;Moist Heat;Iontophoresis 76m/ml Dexamethasone;Therapeutic activities;Functional mobility training;Therapeutic exercise;Balance training;Patient/family education;Manual techniques;Dry needling;Passive range of motion    PT Next Visit Plan DC    PT Home Exercise Plan [email or web address]    Consulted and Agree with Plan of Care Patient           Patient will benefit from skilled therapeutic intervention in order to improve the following deficits and impairments:  Pain, Postural dysfunction, Decreased mobility, Decreased strength, Decreased range of motion, Impaired flexibility  Visit Diagnosis: Muscle weakness (generalized)  Pain in left hip  Pain in right hip     Problem List Patient Active Problem List   Diagnosis Date Noted  . Hoarseness 05/07/2018  . Globus pharyngeus 10/05/2017  . Hamstring strain, subsequent encounter 06/22/2016  .  Benign prostatic hyperplasia with urinary obstruction 11/15/2015  . Sick sinus syndrome ([place] 08/30/2015  . Palpitations 08/30/2015  . Plantar fasciitis of  right foot 06/16/2015  . Pes planus of both feet 06/16/2015  . Lateral epicondylitis of right elbow 04/21/2014  . Chronic pelvic pain in male 12/08/2013  . Chronic prostatitis 12/08/2013  . Aortic insufficiency 01/31/2012  . Other pulmonary embolism and infarction 04/05/2011  . Long term (current) use of anticoagulants 04/05/2011  . Elevated prostate specific antigen (PSA) 03/13/2011  . OTHER AND UNSPECIFIED ANGINA PECTORIS 11/10/2008  . CAD, NATIVE VESSEL 11/10/2008  . HYPERLIPIDEMIA 11/07/2008  . MITRAL VALVE PROLAPSE 11/07/2008  . ALLERGIC RHINITIS 11/07/2008  . GERD 11/07/2008  . ANEMIA, HX OF 11/07/2008  . PALPITATIONS, HX OF 11/07/2008  . TONSILLECTOMY AND ADENOIDECTOMY, HX OF 11/07/2008    Kia Stavros 12/23/2019, 5:41 PM  [facility] [address] [place], [place], [address] Phone: [phone]   Fax:  [phone]  Name: KOSTON HENNES MRN: [id number] Date of Birth: July 29, 1945    PHYSICAL THERAPY DISCHARGE SUMMARY  Visits from Start of Care: 5  Current functional level related to goals / functional outcomes: See above    Remaining deficits: Hip ROM, spine stiffness    Education / Equipment: HEP, posture, lifting, body mechanics  Plan: Patient agrees to discharge.  Patient goals were partially met. Patient is being discharged due to meeting the stated rehab goals.  ?????     Raeford Razor, PT 12/23/19 5:41 PM Phone: [phone] Fax: [phone]

## 2020-02-20 ENCOUNTER — Telehealth: Payer: Self-pay

## 2020-02-20 NOTE — Telephone Encounter (Signed)
Called patient back. His service is terrible and only few words could be understood. He is out of town and was told he will be called next week to arrange follow-up.

## 2020-02-20 NOTE — Telephone Encounter (Signed)
Called to arrange 1 year visit with Dr. Burt Knack (due 05/04/2020) with stress echo prior to visit. Will arrange visit and have [facility] call to arrange stress echo and Covid test prior.   Left message to call back.

## 2020-02-20 NOTE — Telephone Encounter (Signed)
Nathaniel [name] is returning [name] call.

## 2020-02-25 NOTE — Telephone Encounter (Signed)
Scheduled patient for visit with Dr. Burt Knack 11/19. He understands he will be called to schedule stress echo and covid test prior to visit. Will send letter with instructions once arranged. The patient was grateful for assistance.

## 2020-02-26 NOTE — Telephone Encounter (Signed)
Stress echo has been scheduled 11/2. Letter mailed to patient with upcoming appointment information and instructions.

## 2020-03-09 ENCOUNTER — Other Ambulatory Visit (HOSPITAL_COMMUNITY): Payer: Medicare Other

## 2020-03-11 ENCOUNTER — Other Ambulatory Visit (HOSPITAL_COMMUNITY): Payer: Medicare Other

## 2020-04-06 ENCOUNTER — Telehealth: Payer: Self-pay | Admitting: Cardiovascular Disease

## 2020-04-06 NOTE — Telephone Encounter (Signed)
Scheduled the patient 11/17 with Dr. Burt Knack. He was grateful for assistance.

## 2020-04-06 NOTE — Telephone Encounter (Signed)
Patient is returning call from Dr. Antionette Char nurse to reschedule 04/30/20 appointment. He states in the message he was told to reschedule for 04/28/20, but there are holds on that day. Please call back to assist with scheduling.

## 2020-04-09 ENCOUNTER — Other Ambulatory Visit (HOSPITAL_COMMUNITY)
Admission: RE | Admit: 2020-04-09 | Discharge: 2020-04-09 | Disposition: A | Discharge status: Home / Self Care | Payer: Medicare Other | Source: Ambulatory Visit | Attending: Cardiovascular Disease | Admitting: Cardiovascular Disease

## 2020-04-09 DIAGNOSIS — Z20822 Contact with and (suspected) exposure to covid-19: Secondary | ICD-10-CM | POA: Insufficient documentation

## 2020-04-09 DIAGNOSIS — Z01812 Encounter for preprocedural laboratory examination: Secondary | ICD-10-CM | POA: Insufficient documentation

## 2020-04-09 LAB — SARS CORONAVIRUS 2 (TAT 6-24 HRS): SARS Coronavirus 2: NEGATIVE

## 2020-04-12 ENCOUNTER — Telehealth (HOSPITAL_COMMUNITY): Payer: Self-pay

## 2020-04-12 NOTE — Telephone Encounter (Signed)
Spoke with the patient, detailed instructions were given. He stated that he would be here for his test. Asked to call back with any questions. Nathaniel [name] EMTP 

## 2020-04-13 ENCOUNTER — Other Ambulatory Visit: Payer: Self-pay

## 2020-04-13 ENCOUNTER — Ambulatory Visit (HOSPITAL_COMMUNITY): Payer: Medicare Other

## 2020-04-13 ENCOUNTER — Ambulatory Visit (HOSPITAL_COMMUNITY): Payer: Medicare Other | Attending: Cardiovascular Disease

## 2020-04-13 DIAGNOSIS — I208 Other forms of angina pectoris: Secondary | ICD-10-CM | POA: Insufficient documentation

## 2020-04-13 LAB — ECHOCARDIOGRAM STRESS TEST
Area-P 1/2: 1.72 cm2
P 1/2 time: 889 msec
S' Lateral: 2.6 cm

## 2020-04-19 ENCOUNTER — Ambulatory Visit: Payer: Medicare Other | Attending: Internal Medicine

## 2020-04-19 ENCOUNTER — Other Ambulatory Visit (HOSPITAL_BASED_OUTPATIENT_CLINIC_OR_DEPARTMENT_OTHER): Payer: Self-pay | Admitting: Internal Medicine

## 2020-04-19 DIAGNOSIS — Z23 Encounter for immunization: Secondary | ICD-10-CM

## 2020-04-19 NOTE — Progress Notes (Signed)
   Covid-19 Vaccination Clinic  Name:  DARRYON BASTIN    MRN: [id number] DOB: May 12, 1946  04/19/2020  Mr. Sobel was observed post Covid-19 immunization for 15 minutes without incident. He was provided with Vaccine Information Sheet and instruction to access the V-Safe system.   [name] was instructed to call 911 with any severe reactions post vaccine: Marland Kitchen Difficulty breathing  . Swelling of face and throat  . A fast heartbeat  . A bad rash all over body  . Dizziness and weakness

## 2020-04-28 ENCOUNTER — Ambulatory Visit: Payer: Medicare Other | Admitting: Cardiovascular Disease

## 2020-04-28 ENCOUNTER — Other Ambulatory Visit: Payer: Self-pay

## 2020-04-28 ENCOUNTER — Encounter: Payer: Self-pay | Admitting: Cardiovascular Disease

## 2020-04-28 VITALS — BP 120/80 | HR 44 | Ht 68.0 in | Wt 171.2 lb

## 2020-04-28 DIAGNOSIS — E782 Mixed hyperlipidemia: Secondary | ICD-10-CM

## 2020-04-28 DIAGNOSIS — R001 Bradycardia, unspecified: Secondary | ICD-10-CM | POA: Diagnosis not present

## 2020-04-28 DIAGNOSIS — I25119 Atherosclerotic heart disease of native coronary artery with unspecified angina pectoris: Secondary | ICD-10-CM

## 2020-04-28 MED ORDER — ATORVASTATIN CALCIUM 20 MG PO TABS: 20.0000 mg | ORAL_TABLET | Freq: Every day | ORAL | 3 refills | Status: DC

## 2020-04-28 NOTE — Patient Instructions (Signed)
Medication Instructions:  Your provider recommends that you continue on your current medications as directed. Please refer to the Current Medication list given to you today.   *If you need a refill on your cardiac medications before your next appointment, please call your pharmacy*   Follow-Up: You will be called to arrange your 1 year echo and office visit with Dr. Antionette Char November 2022 schedule is available.

## 2020-04-28 NOTE — Progress Notes (Signed)
Cardiology Office Note:    Date:  04/28/2020   ID:  JAYSEAN MANVILLE, DOB 03-11-1946, MRN [id number]  PCP:  Lajean Manes, MD  [facility] Cardiologist:  Sherren Mocha, MD  [facility] Electrophysiologist:  None   Referring MD: Lajean Manes, MD   Chief Complaint  Patient presents with  . Coronary Artery Disease    History of Present Illness:    Nathaniel [name] is a 74 y.o. male with a hx of nonobstructive coronary artery disease, chronic microvascular angina, and bradycardia, presenting for follow-up evaluation.  The patient is here alone today. He has been getting his exercise by rowing on [facility]. He rows for about an hour without angina or exertional symptoms. He has noticed over the past 6 months that his angina has really improved and he's not sure why.  His exercise capacity has been good.  He denies shortness of breath, edema, orthopnea, or PND.  Historically, he has had increased heart palpitations after vigorous exercise.  He has not noticed this recently except for the few days following his stress test.  He also had some symptoms of angina during the recovery phase after his stress test.  Past Medical History:  Diagnosis Date  . Allergic rhinitis   . Anemia    pt unaware  . Anginal pain ([place])    Microvascular angina- followed by Dr Burt Knack  . Arthritis    back  . Cancer (HCC)    Basal cell -   . Coronary artery disease    nonobstructive  . DVT (deep venous thrombosis) ([place]) 03/2011   after prolonged travel  . GERD (gastroesophageal reflux disease)   . Headache    Vision Migraines  . Hiatal hernia    small  . History of rheumatic fever    questionable  . Hyperlipidemia   . Increased prostate specific antigen (PSA) velocity   . Laryngopharyngeal reflux   . Mitral valve prolapse   . Palpitations   . PE (pulmonary embolism) 03/2011   after prolonged travel  . PONV (postoperative nausea and vomiting)    Fentyl- N/V  . Pruritus    resolved    . Shortness of breath dyspnea    with Exertion  . Sinus bradycardia     Past Surgical History:  Procedure Laterality Date  . COLONOSCOPY    . COLONOSCOPY N/A 02/24/2015   Procedure: COLONOSCOPY;  Surgeon: Ronald Lobo, MD;  Location[facility] ENDOSCOPY;  Service: Endoscopy;  Laterality: N/A;  . CYSTOSCOPY    . ESOPHAGOGASTRODUODENOSCOPY N/A 02/06/2013   Procedure: ESOPHAGOGASTRODUODENOSCOPY (EGD);  Surgeon: Cleotis Nipper, MD;  Location: [facility] ENDOSCOPY;  Service: Endoscopy;  Laterality: N/A;  . ESOPHAGOGASTRODUODENOSCOPY N/A 02/24/2015   Procedure: ESOPHAGOGASTRODUODENOSCOPY (EGD);  Surgeon: Ronald Lobo, MD;  Location: [facility] ENDOSCOPY;  Service: Endoscopy;  Laterality: N/A;  . HUMERUS FRACTURE SURGERY Right 05/2003  . Repair of an AC seperation of the left    . SHOULDER ARTHROSCOPY Left    A/C tear  . TONSILLECTOMY AND ADENOIDECTOMY      Current Medications: Current Meds  Medication Sig  . atorvastatin (LIPITOR) 20 MG tablet Take 1 tablet (20 mg total) by mouth daily.  Marland Kitchen azelastine (ASTELIN) 0.1 % nasal spray Place into both nostrils.  . chlorpheniramine (CHLOR-TRIMETON) 2 MG/5ML syrup Take by mouth.  . cholecalciferol (VITAMIN D) 1000 UNITS tablet Take 1,000 Units by mouth every morning.   . Cyanocobalamin (VITAMIN B-12) 2500 MCG SUBL Place 1 tablet under the tongue every 7 (seven) days. Takes  on Wednesday  . esomeprazole (NEXIUM) 40 MG capsule Take 40 mg by mouth at bedtime.  Earney Navy Bicarbonate (ZEGERID PO) Take 20 mg by mouth daily.   . sildenafil (REVATIO) 20 MG tablet Take by mouth.  . Tamsulosin HCl (FLOMAX) 0.4 MG CAPS Take 0.4 mg by mouth at bedtime.   . [DISCONTINUED] atorvastatin (LIPITOR) 20 MG tablet Take 1 tablet (20 mg total) by mouth daily.     Allergies:   Penicillins, Fentanyl, and Imdur [isosorbide dinitrate]   Social History   Socioeconomic History  . Marital status: Married    Spouse name: Not on file  . Number of children: Not on file  . Years  of education: Not on file  . Highest education level: Not on file  Occupational History  . Occupation: retired  Tobacco Use  . Smoking status: Never Smoker  . Smokeless tobacco: Never Used  Substance and Sexual Activity  . Alcohol use: Yes    Comment: occasional- special ocassional  . Drug use: No  . Sexual activity: Not on file  Other Topics Concern  . Not on file  Social History Narrative   Pt lives in [place] with wife and daughter (has autism).  Son is an anesthesia resident in [place].   Retired Materials engineer in 2014.     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   Social Determinants of Health   Financial Resource Strain:   . Difficulty of Paying Living Expenses: Not on file  Food Insecurity:   . Worried About Charity fundraiser in the Last Year: Not on file  . Ran Out of Food in the Last Year: Not on file  Transportation Needs:   . Lack of Transportation (Medical): Not on file  . Lack of Transportation (Non-Medical): Not on file  Physical Activity:   . Days of Exercise per Week: Not on file  . Minutes of Exercise per Session: Not on file  Stress:   . Feeling of Stress : Not on file  Social Connections:   . Frequency of Communication with Friends and Family: Not on file  . Frequency of Social Gatherings with Friends and Family: Not on file  . Attends Religious Services: Not on file  . Active Member of Clubs or Organizations: Not on file  . Attends Archivist Meetings: Not on file  . Marital Status: Not on file     Family History: The patient's family history includes CAD (age of onset: 55) in an other family member; Cancer in an other family member.  ROS:   Please see the history of present illness.    All other systems reviewed and are negative.  EKGs/Labs/Other Studies Reviewed:    The following studies were reviewed today: Echo 05-01-2019: IMPRESSIONS    1. Left ventricular ejection fraction, by visual estimation, is 50 to  55%. The left  ventricle has normal function. There is mildly increased  left ventricular hypertrophy.  2. Elevated left atrial pressure.  3. Left ventricular diastolic parameters are consistent with Grade I  diastolic dysfunction (impaired relaxation).  4. Global right ventricle has normal systolic function.The right  ventricular size is normal.  5. Left atrial size was normal.  6. Right atrial size was normal.  7. The mitral valve is normal in structure. No evidence of mitral valve  regurgitation. No evidence of mitral stenosis.  8. The tricuspid valve is normal in structure. Tricuspid valve  regurgitation is trivial.  9. The aortic valve is tricuspid. Aortic valve  regurgitation is mild.  Mild aortic valve sclerosis without stenosis.  10. The pulmonic valve was normal in structure. Pulmonic valve  regurgitation is trivial.  11. Aortic dilatation noted.  12. There is mild dilatation of the ascending aorta measuring 39 mm.  13. Mildly elevated pulmonary artery systolic pressure.  14. The inferior vena cava is normal in size with greater than 50%  respiratory variability, suggesting right atrial pressure of 3 mmHg.  15. Normal LV function; grade 1 diastolic dysfunction; mild LVH: mildly  dilated ascendinga aorta; mild AI.   Long-term monitor 07/15/2019: Study Highlights  1. The basic rhythm is sinus bradycardia with an average HR of 49 bpm 2. There are rare, short supraventricular runs up to 5-7 beats 3. No atrial fibrillation or flutter 4. Rare, single PVC's 5. No high-grade AV block 6. Few pauses occur. The longest pause is 3.5 seconds. All pauses 3-3.5 seconds occur in the early morning hours 0600-0715 am  Coronary CTA 06/18/2017: IMPRESSION: 1. Coronary artery calcium score 90 Agatston units placing the patient in the 38th percentile for age and gender.  2.  Nonobstructive coronary disease.  3.  Small intramyocardial segment in the proximal LAD.  ABIs 10/28/2019: Summary:    Right: Resting right ankle-brachial index is within normal range. No  evidence of significant right lower extremity arterial disease.   Left: Resting left ankle-brachial index is within normal range. No  evidence of significant left lower extremity arterial disease.   Stress Echo 04/13/2020: IMPRESSIONS    1. This is a negative stress echocardiogram for ischemia.  2. This is a low risk study.   FINDINGS   Exam Protocol: The patient exercised on a treadmill according to a Bruce  protocol.     Patient Performance: The patient exercised for 9 minutes and 45 seconds,  achieving 11 METS. The maximum stage achieved was IV of the Bruce  protocol. The baseline heart rate was 68 bpm. The heart rate at peak  stress was 139 bpm. The target heart rate was  calculated to be 123 bpm. The percentage of maximum predicted heart rate  achieved was 96.0 %. The baseline blood pressure was 134/74 mmHg. The  blood pressure at peak stress was 175/93 mmHg. The blood pressure response  was normal. The patient developed  chest pain, shortness of breath and fatigue during the stress exam.    EKG: Resting EKG showed normal sinus rhythm with no abnormal findings. The  patient developed no abnormal EKG findings during exercise.     2D Echo Findings: The baseline ejection fraction was 60%. The peak  ejection fraction at stress was 75%. Baseline regional wall motion  abnormalities were not present. There were no stress-induced wall motion  abnormalities. This is a negative stress  echocardiogram for ischemia.    Additional Findings: Negative, adequate stress test. Excellent exercise  capacity.    EKG:  EKG is ordered today.  The ekg ordered today demonstrates sinus bradycardia 44 bpm, otherwise within normal limits.  Recent Labs: No results found for requested labs within last 8760 hours.  Recent Lipid Panel    Component Value Date/Time   CHOL 124 05/15/2013 0926   TRIG 77.0 05/15/2013 0926    HDL 34.70 (L) 05/15/2013 0926   CHOLHDL 4 05/15/2013 0926   VLDL 15.4 05/15/2013 0926   LDLCALC 74 05/15/2013 0926     Risk Assessment/Calculations:       Physical Exam:    VS:  BP 120/80   Pulse (!) 44  Ht 5\' 8"  (1.727 m)   Wt 171 lb 3.2 oz (77.7 kg)   SpO2 96%   BMI 26.03 kg/m     Wt Readings from Last 3 Encounters:  04/28/20 171 lb 3.2 oz (77.7 kg)  10/21/19 170 lb (77.1 kg)  05/05/19 175 lb 1.9 oz (79.4 kg)     GEN:  Well nourished, well developed in no acute distress HEENT: Normal NECK: No JVD; No carotid bruits LYMPHATICS: No lymphadenopathy CARDIAC: bradycardic and regular, no murmurs, rubs, gallops RESPIRATORY:  Clear to auscultation without rales, wheezing or rhonchi  ABDOMEN: Soft, non-tender, non-distended MUSCULOSKELETAL:  No edema; No deformity  SKIN: Warm and dry NEUROLOGIC:  Alert and oriented x 3 PSYCHIATRIC:  Normal affect   ASSESSMENT:    1. Bradycardia   2. Mixed hyperlipidemia   3. Coronary artery disease involving native coronary artery of native heart with angina pectoris ([place])    PLAN:    In order of problems listed above:  1. Stable findings, resting heart rate 44 bpm today with no signs of advanced degree heart block.  Continue observation at this time.  Also important to note that he had normal chronotropic response to exercise on his stress echocardiogram. 2. Treated with atorvastatin.  Recent lipids and LFTs reviewed.  LDL cholesterol 74 mg/dL, HDL 38, total cholesterol 134, ALT 15. 3. The patient is doing very well with good functional capacity and no lifestyle limiting angina at present.  I have reviewed his coronary CT angiogram from 2019, recent stress echocardiogram, and medical program which will be continued without changes.  I am going to check an echocardiogram prior to his office visit next year.  We will discuss further functional testing next year if he is having any change in his anginal symptoms.  Otherwise I would  anticipate ongoing medical therapy.    Shared Decision Making/Informed Consent        Medication Adjustments/Labs and Tests Ordered: Current medicines are reviewed at length with the patient today.  Concerns regarding medicines are outlined above.  Orders Placed This Encounter  Procedures  . EKG 12-Lead   Meds ordered this encounter  Medications  . atorvastatin (LIPITOR) 20 MG tablet    Sig: Take 1 tablet (20 mg total) by mouth daily.    Dispense:  90 tablet    Refill:  3    Patient Instructions  Medication Instructions:  Your provider recommends that you continue on your current medications as directed. Please refer to the Current Medication list given to you today.   *If you need a refill on your cardiac medications before your next appointment, please call your pharmacy*   Follow-Up: You will be called to arrange your 1 year echo and office visit with Dr. Antionette Char November 2022 schedule is available.    Signed, Sherren Mocha, MD  04/28/2020 4:33 PM    [place]

## 2020-04-30 ENCOUNTER — Ambulatory Visit: Payer: Medicare Other | Admitting: Cardiovascular Disease

## 2020-06-17 ENCOUNTER — Other Ambulatory Visit: Payer: Self-pay

## 2020-06-17 ENCOUNTER — Ambulatory Visit: Payer: Medicare Other | Admitting: Sports Medicine

## 2020-06-17 VITALS — BP 98/62 | Ht 67.0 in | Wt 170.0 lb

## 2020-06-17 DIAGNOSIS — M533 Sacrococcygeal disorders, not elsewhere classified: Secondary | ICD-10-CM

## 2020-06-17 DIAGNOSIS — M25559 Pain in unspecified hip: Secondary | ICD-10-CM

## 2020-06-17 NOTE — Progress Notes (Addendum)
   Subjective:    Patient ID: Nathaniel [name], male    DOB: June 04, 1946, 75 y.o.   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  His main complaint is lateral right hip pain that he localizes just superior to the iliac crest.  He is able to put his finger on the point of maximum pain.  Symptoms began acutely 1 day upon awakening.  He does not recall any specific injury.  He denies any increase in activity leading up to his pain but he has recently resumed some light golfing.  Pain is most noticeable with hip flexion.  It does not radiate. He is also getting some left-sided low back pain that he localizes around the SI joint.  This also has been present for couple of weeks.  Again no trauma but he does admit to some mild discomfort in this area occasionally when swinging a golf club leading up to this more acute episode.  Again, pain does not radiate.  He has tried heat on his right hip which has been helpful.  No recent trauma.  Past medical history reviewed Medications reviewed Allergies reviewed    Review of Systems As above    Objective:   Physical Exam  Well-developed, well-nourished.  No acute distress  Right hip: There is point tenderness to palpation along the lateral hip just superior to the iliac crest.  No palpable defect.  Pain is reproducible with hip flexion.  No significant pain with twisting.  Left hip: There is reproducible pain with FABER testing which does localize to the SI joint.  Mild tenderness to palpation here as well.      Assessment & Plan:   Left hip SI joint dysfunction Lateral right hip pain likely secondary to transverse abdominis or oblique strain  I think the left SI joint dysfunction is straightforward.  His lateral right hip pain is less straightforward but does appear to be soft tissue related.  Since he has recently resumed golfing, I  think he would benefit greatly from a referral to [facility] for physical therapy and analysis of his golf swing.  He will continue with heat as needed for pain.  He does not tolerate NSAIDs but does well with prednisone.  We briefly discussed a prescription today but we will hold on that for the time being.  I am optimistic that [facility] will be able to resolve his current issues and [name] will follow up with me as needed.  Total time spent with the patient was 30 minutes with 100% of the time spent in face-to-face consultation.

## 2020-06-22 DIAGNOSIS — J342 Deviated nasal septum: Secondary | ICD-10-CM | POA: Diagnosis not present

## 2020-06-22 DIAGNOSIS — J3489 Other specified disorders of nose and nasal sinuses: Secondary | ICD-10-CM | POA: Diagnosis not present

## 2020-06-22 DIAGNOSIS — J310 Chronic rhinitis: Secondary | ICD-10-CM | POA: Diagnosis not present

## 2020-07-02 ENCOUNTER — Ambulatory Visit: Payer: Medicare Other | Admitting: Cardiovascular Disease

## 2020-08-18 ENCOUNTER — Other Ambulatory Visit: Payer: Self-pay | Admitting: Surgery

## 2020-08-18 DIAGNOSIS — K4090 Unilateral inguinal hernia, without obstruction or gangrene, not specified as recurrent: Secondary | ICD-10-CM

## 2020-08-24 DIAGNOSIS — Z885 Allergy status to narcotic agent status: Secondary | ICD-10-CM | POA: Diagnosis not present

## 2020-08-24 DIAGNOSIS — J302 Other seasonal allergic rhinitis: Secondary | ICD-10-CM | POA: Diagnosis not present

## 2020-08-24 DIAGNOSIS — Z880 Allergy status to penicillin: Secondary | ICD-10-CM | POA: Diagnosis not present

## 2020-08-24 DIAGNOSIS — Z79899 Other long term (current) drug therapy: Secondary | ICD-10-CM | POA: Diagnosis not present

## 2020-08-24 DIAGNOSIS — J343 Hypertrophy of nasal turbinates: Secondary | ICD-10-CM | POA: Diagnosis not present

## 2020-08-24 DIAGNOSIS — J301 Allergic rhinitis due to pollen: Secondary | ICD-10-CM | POA: Diagnosis not present

## 2020-08-24 DIAGNOSIS — J342 Deviated nasal septum: Secondary | ICD-10-CM | POA: Diagnosis not present

## 2020-08-24 DIAGNOSIS — Z888 Allergy status to other drugs, medicaments and biological substances status: Secondary | ICD-10-CM | POA: Diagnosis not present

## 2020-08-24 DIAGNOSIS — J3489 Other specified disorders of nose and nasal sinuses: Secondary | ICD-10-CM | POA: Diagnosis not present

## 2020-09-22 DIAGNOSIS — K4090 Unilateral inguinal hernia, without obstruction or gangrene, not specified as recurrent: Secondary | ICD-10-CM | POA: Diagnosis not present

## 2020-10-08 DIAGNOSIS — H903 Sensorineural hearing loss, bilateral: Secondary | ICD-10-CM | POA: Diagnosis not present

## 2020-10-08 DIAGNOSIS — H6123 Impacted cerumen, bilateral: Secondary | ICD-10-CM | POA: Diagnosis not present

## 2020-10-08 DIAGNOSIS — H9313 Tinnitus, bilateral: Secondary | ICD-10-CM | POA: Diagnosis not present

## 2020-10-13 DIAGNOSIS — R030 Elevated blood-pressure reading, without diagnosis of hypertension: Secondary | ICD-10-CM | POA: Diagnosis not present

## 2020-10-21 DIAGNOSIS — N138 Other obstructive and reflux uropathy: Secondary | ICD-10-CM | POA: Diagnosis not present

## 2020-10-21 DIAGNOSIS — G8929 Other chronic pain: Secondary | ICD-10-CM | POA: Diagnosis not present

## 2020-11-02 DIAGNOSIS — J3489 Other specified disorders of nose and nasal sinuses: Secondary | ICD-10-CM | POA: Diagnosis not present

## 2020-11-02 DIAGNOSIS — J342 Deviated nasal septum: Secondary | ICD-10-CM | POA: Diagnosis not present

## 2020-11-02 DIAGNOSIS — J343 Hypertrophy of nasal turbinates: Secondary | ICD-10-CM | POA: Diagnosis not present

## 2020-11-02 DIAGNOSIS — J301 Allergic rhinitis due to pollen: Secondary | ICD-10-CM | POA: Diagnosis not present

## 2020-11-02 DIAGNOSIS — Z885 Allergy status to narcotic agent status: Secondary | ICD-10-CM | POA: Diagnosis not present

## 2020-11-02 DIAGNOSIS — M792 Neuralgia and neuritis, unspecified: Secondary | ICD-10-CM | POA: Diagnosis not present

## 2020-11-02 DIAGNOSIS — J302 Other seasonal allergic rhinitis: Secondary | ICD-10-CM | POA: Diagnosis not present

## 2020-11-02 DIAGNOSIS — Z888 Allergy status to other drugs, medicaments and biological substances status: Secondary | ICD-10-CM | POA: Diagnosis not present

## 2020-11-02 DIAGNOSIS — Z880 Allergy status to penicillin: Secondary | ICD-10-CM | POA: Diagnosis not present

## 2020-11-09 DIAGNOSIS — K219 Gastro-esophageal reflux disease without esophagitis: Secondary | ICD-10-CM | POA: Diagnosis not present

## 2020-11-09 DIAGNOSIS — I872 Venous insufficiency (chronic) (peripheral): Secondary | ICD-10-CM | POA: Diagnosis not present

## 2020-11-09 DIAGNOSIS — Z79899 Other long term (current) drug therapy: Secondary | ICD-10-CM | POA: Diagnosis not present

## 2020-11-09 DIAGNOSIS — R030 Elevated blood-pressure reading, without diagnosis of hypertension: Secondary | ICD-10-CM | POA: Diagnosis not present

## 2020-11-12 ENCOUNTER — Other Ambulatory Visit: Payer: Self-pay | Admitting: Geriatric Medicine

## 2020-11-12 DIAGNOSIS — Z79899 Other long term (current) drug therapy: Secondary | ICD-10-CM

## 2020-11-19 ENCOUNTER — Telehealth: Payer: Self-pay

## 2020-11-19 DIAGNOSIS — I25119 Atherosclerotic heart disease of native coronary artery with unspecified angina pectoris: Secondary | ICD-10-CM

## 2020-11-19 NOTE — Telephone Encounter (Signed)
Called to schedule 1 year visit with Dr. Burt Knack in November 2022. Left message to call back.  Will arrange echo a couple days prior to visit.

## 2020-11-19 NOTE — Telephone Encounter (Signed)
Scheduled the patient for echo 04/25/21 and visit with Dr. Burt Knack 04/27/21. He was grateful for call and agrees with plan.

## 2020-11-29 ENCOUNTER — Ambulatory Visit: Payer: Medicare Other | Admitting: Sports Medicine

## 2020-12-01 ENCOUNTER — Ambulatory Visit
Admission: RE | Admit: 2020-12-01 | Discharge: 2020-12-01 | Disposition: A | Discharge status: Home / Self Care | Payer: Medicare Other | Source: Ambulatory Visit | Attending: Geriatric Medicine | Admitting: Geriatric Medicine

## 2020-12-01 ENCOUNTER — Other Ambulatory Visit: Payer: Self-pay

## 2020-12-01 DIAGNOSIS — Z79899 Other long term (current) drug therapy: Secondary | ICD-10-CM

## 2020-12-01 DIAGNOSIS — M85851 Other specified disorders of bone density and structure, right thigh: Secondary | ICD-10-CM | POA: Diagnosis not present

## 2020-12-06 DIAGNOSIS — M76891 Other specified enthesopathies of right lower limb, excluding foot: Secondary | ICD-10-CM | POA: Diagnosis not present

## 2020-12-06 DIAGNOSIS — M659 Synovitis and tenosynovitis, unspecified: Secondary | ICD-10-CM | POA: Diagnosis not present

## 2021-01-14 DIAGNOSIS — S66197A Other injury of flexor muscle, fascia and tendon of left little finger at wrist and hand level, initial encounter: Secondary | ICD-10-CM | POA: Diagnosis not present

## 2021-02-08 ENCOUNTER — Other Ambulatory Visit: Payer: Self-pay | Admitting: Cardiovascular Disease

## 2021-02-08 DIAGNOSIS — E782 Mixed hyperlipidemia: Secondary | ICD-10-CM

## 2021-02-09 DIAGNOSIS — K219 Gastro-esophageal reflux disease without esophagitis: Secondary | ICD-10-CM | POA: Diagnosis not present

## 2021-02-09 DIAGNOSIS — R198 Other specified symptoms and signs involving the digestive system and abdomen: Secondary | ICD-10-CM | POA: Diagnosis not present

## 2021-02-09 DIAGNOSIS — K6289 Other specified diseases of anus and rectum: Secondary | ICD-10-CM | POA: Diagnosis not present

## 2021-02-16 DIAGNOSIS — M79642 Pain in left hand: Secondary | ICD-10-CM | POA: Diagnosis not present

## 2021-02-16 DIAGNOSIS — M79641 Pain in right hand: Secondary | ICD-10-CM | POA: Diagnosis not present

## 2021-02-16 DIAGNOSIS — S63697A Other sprain of left little finger, initial encounter: Secondary | ICD-10-CM | POA: Diagnosis not present

## 2021-02-18 DIAGNOSIS — R198 Other specified symptoms and signs involving the digestive system and abdomen: Secondary | ICD-10-CM | POA: Diagnosis not present

## 2021-02-18 DIAGNOSIS — S63697A Other sprain of left little finger, initial encounter: Secondary | ICD-10-CM | POA: Diagnosis not present

## 2021-03-01 DIAGNOSIS — H5213 Myopia, bilateral: Secondary | ICD-10-CM | POA: Diagnosis not present

## 2021-03-01 DIAGNOSIS — E559 Vitamin D deficiency, unspecified: Secondary | ICD-10-CM | POA: Diagnosis not present

## 2021-03-01 DIAGNOSIS — Z0000 Encounter for general adult medical examination without abnormal findings: Secondary | ICD-10-CM | POA: Diagnosis not present

## 2021-03-01 DIAGNOSIS — H2513 Age-related nuclear cataract, bilateral: Secondary | ICD-10-CM | POA: Diagnosis not present

## 2021-03-01 DIAGNOSIS — H52203 Unspecified astigmatism, bilateral: Secondary | ICD-10-CM | POA: Diagnosis not present

## 2021-03-01 DIAGNOSIS — E7800 Pure hypercholesterolemia, unspecified: Secondary | ICD-10-CM | POA: Diagnosis not present

## 2021-03-01 DIAGNOSIS — G43109 Migraine with aura, not intractable, without status migrainosus: Secondary | ICD-10-CM | POA: Diagnosis not present

## 2021-03-01 DIAGNOSIS — Z79899 Other long term (current) drug therapy: Secondary | ICD-10-CM | POA: Diagnosis not present

## 2021-03-01 DIAGNOSIS — R350 Frequency of micturition: Secondary | ICD-10-CM | POA: Diagnosis not present

## 2021-03-01 DIAGNOSIS — K9089 Other intestinal malabsorption: Secondary | ICD-10-CM | POA: Diagnosis not present

## 2021-03-01 DIAGNOSIS — K219 Gastro-esophageal reflux disease without esophagitis: Secondary | ICD-10-CM | POA: Diagnosis not present

## 2021-03-14 DIAGNOSIS — D2262 Melanocytic nevi of left upper limb, including shoulder: Secondary | ICD-10-CM | POA: Diagnosis not present

## 2021-03-14 DIAGNOSIS — I788 Other diseases of capillaries: Secondary | ICD-10-CM | POA: Diagnosis not present

## 2021-03-14 DIAGNOSIS — L570 Actinic keratosis: Secondary | ICD-10-CM | POA: Diagnosis not present

## 2021-03-14 DIAGNOSIS — D2261 Melanocytic nevi of right upper limb, including shoulder: Secondary | ICD-10-CM | POA: Diagnosis not present

## 2021-03-14 DIAGNOSIS — D485 Neoplasm of uncertain behavior of skin: Secondary | ICD-10-CM | POA: Diagnosis not present

## 2021-03-14 DIAGNOSIS — Z85828 Personal history of other malignant neoplasm of skin: Secondary | ICD-10-CM | POA: Diagnosis not present

## 2021-03-14 DIAGNOSIS — L821 Other seborrheic keratosis: Secondary | ICD-10-CM | POA: Diagnosis not present

## 2021-03-14 DIAGNOSIS — D225 Melanocytic nevi of trunk: Secondary | ICD-10-CM | POA: Diagnosis not present

## 2021-03-14 DIAGNOSIS — L820 Inflamed seborrheic keratosis: Secondary | ICD-10-CM | POA: Diagnosis not present

## 2021-03-14 DIAGNOSIS — D0472 Carcinoma in situ of skin of left lower limb, including hip: Secondary | ICD-10-CM | POA: Diagnosis not present

## 2021-04-05 DIAGNOSIS — S83281A Other tear of lateral meniscus, current injury, right knee, initial encounter: Secondary | ICD-10-CM | POA: Diagnosis not present

## 2021-04-07 DIAGNOSIS — H903 Sensorineural hearing loss, bilateral: Secondary | ICD-10-CM | POA: Diagnosis not present

## 2021-04-07 DIAGNOSIS — H6123 Impacted cerumen, bilateral: Secondary | ICD-10-CM | POA: Diagnosis not present

## 2021-04-07 DIAGNOSIS — H9313 Tinnitus, bilateral: Secondary | ICD-10-CM | POA: Diagnosis not present

## 2021-04-15 DIAGNOSIS — J342 Deviated nasal septum: Secondary | ICD-10-CM | POA: Diagnosis not present

## 2021-04-15 DIAGNOSIS — G629 Polyneuropathy, unspecified: Secondary | ICD-10-CM | POA: Diagnosis not present

## 2021-04-15 DIAGNOSIS — J301 Allergic rhinitis due to pollen: Secondary | ICD-10-CM | POA: Diagnosis not present

## 2021-04-15 DIAGNOSIS — J302 Other seasonal allergic rhinitis: Secondary | ICD-10-CM | POA: Diagnosis not present

## 2021-04-15 DIAGNOSIS — J3489 Other specified disorders of nose and nasal sinuses: Secondary | ICD-10-CM | POA: Diagnosis not present

## 2021-04-15 DIAGNOSIS — J343 Hypertrophy of nasal turbinates: Secondary | ICD-10-CM | POA: Diagnosis not present

## 2021-04-25 ENCOUNTER — Ambulatory Visit (HOSPITAL_COMMUNITY): Payer: Medicare Other | Attending: Internal Medicine

## 2021-04-25 ENCOUNTER — Other Ambulatory Visit: Payer: Self-pay

## 2021-04-25 DIAGNOSIS — I25119 Atherosclerotic heart disease of native coronary artery with unspecified angina pectoris: Secondary | ICD-10-CM | POA: Diagnosis not present

## 2021-04-25 LAB — ECHOCARDIOGRAM COMPLETE
Area-P 1/2: 2.06 cm2
P 1/2 time: 843 msec
S' Lateral: 3.3 cm

## 2021-04-27 ENCOUNTER — Other Ambulatory Visit: Payer: Self-pay

## 2021-04-27 ENCOUNTER — Encounter: Payer: Self-pay | Admitting: Cardiovascular Disease

## 2021-04-27 ENCOUNTER — Ambulatory Visit: Payer: Medicare Other | Admitting: Cardiovascular Disease

## 2021-04-27 VITALS — BP 118/70 | HR 45 | Ht 66.0 in | Wt 166.8 lb

## 2021-04-27 DIAGNOSIS — R001 Bradycardia, unspecified: Secondary | ICD-10-CM

## 2021-04-27 DIAGNOSIS — E782 Mixed hyperlipidemia: Secondary | ICD-10-CM | POA: Diagnosis not present

## 2021-04-27 DIAGNOSIS — I25119 Atherosclerotic heart disease of native coronary artery with unspecified angina pectoris: Secondary | ICD-10-CM

## 2021-04-27 MED ORDER — ATORVASTATIN CALCIUM 20 MG PO TABS: ORAL_TABLET | ORAL | 3 refills | Status: DC

## 2021-04-27 NOTE — Patient Instructions (Addendum)
Medication Instructions:   *If you need a refill on your cardiac medications before your next appointment, please call your pharmacy*  Lab Work: If you have labs (blood work) drawn today and your tests are completely normal, you will receive your results only by: [place] (if you have MyChart) OR A paper copy in the mail If you have any lab test that is abnormal or we need to change your treatment, we will call you to review the results.  Testing/Procedures: None ordered today.  Follow-Up: At [facility], you and your health needs are our priority.  As part of our continuing mission to provide you with exceptional heart care, we have created designated Provider Care Teams.  These Care Teams include your primary Cardiologist (physician) and Advanced Practice Providers (APPs -  Physician Assistants and Nurse Practitioners) who all work together to provide you with the care you need, when you need it.  We recommend signing up for the patient portal called "MyChart".  Sign up information is provided on this After Visit Summary.  MyChart is used to connect with patients for Virtual Visits (Telemedicine).  Patients are able to view lab/test results, encounter notes, upcoming appointments, etc.  Non-urgent messages can be sent to your provider as well.   To learn more about what you can do with MyChart, go to [email or web address].    Your next appointment:   12 month(s)  The format for your next appointment:   In Person  Provider:   Sherren Mocha, MD

## 2021-04-27 NOTE — Progress Notes (Signed)
Cardiology Office Note:    Date:  04/27/2021   ID:  SEANPAUL [name], DOB 28-Dec-1945, MRN [id number]  PCP:  Lajean Manes, MD   [facility] Providers Cardiologist:  Sherren Mocha, MD     Referring MD: Lajean Manes, MD   Chief Complaint  Patient presents with   Chest Pain    History of Present Illness:    Nathaniel [name] is a 75 y.o. male with a hx of nonobstructive coronary artery disease and chronic microvascular angina, presenting for follow-up evaluation.  The patient is also developed chronic sinus bradycardia.  He is here alone today.  He has been out playing more golf of late.  He generally does quite well, but recently has had a few episodes of brief chest discomfort that feel like a pressure sensation in the upper chest.  This is been typical of his past angina.  He has not required nitroglycerin.  Symptoms have resolved quickly with slowing down.  He also had an episode of near syncope at the grocery store.  He sneezed and then developed fairly marked lightheadedness.  He kneeled down on the ground and his symptoms slowly improved.  He did not have frank syncope.  He has not had exertional dyspnea, orthopnea, or PND.  He has had no recent problems with heart palpitations.  Past Medical History:  Diagnosis Date   Allergic rhinitis    Anemia    pt unaware   Anginal pain ([place])    Microvascular angina- followed by Dr Burt Knack   Arthritis    back   Cancer [facility])    Basal cell -    Coronary artery disease    nonobstructive   DVT (deep venous thrombosis) ([place]) 03/2011   after prolonged travel   GERD (gastroesophageal reflux disease)    Headache    Vision Migraines   Hiatal hernia    small   History of rheumatic fever    questionable   Hyperlipidemia    Increased prostate specific antigen (PSA) velocity    Laryngopharyngeal reflux    Mitral valve prolapse    Palpitations    PE (pulmonary embolism) 03/2011   after prolonged travel   PONV (postoperative nausea  and vomiting)    Fentyl- N/V   Pruritus    resolved   Shortness of breath dyspnea    with Exertion   Sinus bradycardia     Past Surgical History:  Procedure Laterality Date   COLONOSCOPY     COLONOSCOPY N/A 02/24/2015   Procedure: COLONOSCOPY;  Surgeon: Ronald Lobo, MD;  Location: [facility] ENDOSCOPY;  Service: Endoscopy;  Laterality: N/A;   CYSTOSCOPY     ESOPHAGOGASTRODUODENOSCOPY N/A 02/06/2013   Procedure: ESOPHAGOGASTRODUODENOSCOPY (EGD);  Surgeon: Cleotis Nipper, MD;  Location[facility] ENDOSCOPY;  Service: Endoscopy;  Laterality: N/A;   ESOPHAGOGASTRODUODENOSCOPY N/A 02/24/2015   Procedure: ESOPHAGOGASTRODUODENOSCOPY (EGD);  Surgeon: Ronald Lobo, MD;  Location: [facility] ENDOSCOPY;  Service: Endoscopy;  Laterality: N/A;   HUMERUS FRACTURE SURGERY Right 05/2003   Repair of an AC seperation of the left     SHOULDER ARTHROSCOPY Left    A/C tear   TONSILLECTOMY AND ADENOIDECTOMY      Current Medications: Current Meds  Medication Sig   azelastine (ASTELIN) 0.1 % nasal spray Place into both nostrils.   chlorpheniramine (CHLOR-TRIMETON) 2 MG/5ML syrup Take by mouth.   cholecalciferol (VITAMIN D) 1000 UNITS tablet Take 1,000 Units by mouth every morning.    ciclopirox (LOPROX) 8.34 % cream 1 application to affected area  Cyanocobalamin (VITAMIN B-12) 2500 MCG SUBL Place 1 tablet under the tongue every 7 (seven) days. Takes on Wednesday   desoximetasone (TOPICORT) 2.67 % cream 1 application to affected area   mupirocin cream (BACTROBAN) 2 % Apply to each nostril bid   Omeprazole-Sodium Bicarbonate (ZEGERID PO) Take 20 mg by mouth daily.    senna-docusate (SENOKOT-S) 8.6-50 MG tablet 2 tablet   sildenafil (REVATIO) 20 MG tablet Take by mouth.   tamsulosin (FLOMAX) 0.4 MG CAPS capsule Take 0.4 mg by mouth at bedtime.    [DISCONTINUED] atorvastatin (LIPITOR) 20 MG tablet TAKE 1 TABLET(20 MG) BY MOUTH DAILY     Allergies:   Penicillins, Fentanyl, and Imdur [isosorbide dinitrate]   Social  History   Socioeconomic History   Marital status: Married    Spouse name: Not on file   Number of children: Not on file   Years of education: Not on file   Highest education level: Not on file  Occupational History   Occupation: retired  Tobacco Use   Smoking status: Never   Smokeless tobacco: Never  Substance and Sexual Activity   Alcohol use: Yes    Comment: occasional- special ocassional   Drug use: No   Sexual activity: Not on file  Other Topics Concern   Not on file  Social History Narrative   Pt lives in [place] with wife and daughter (has autism).  Son is an anesthesia resident in [place].   Retired Materials engineer in 2014.     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   Social Determinants of Health   Financial Resource Strain: Not on file  Food Insecurity: Not on file  Transportation Needs: Not on file  Physical Activity: Not on file  Stress: Not on file  Social Connections: Not on file     Family History: The patient's family history includes CAD (age of onset: 1) in an other family member; Cancer in an other family member.  ROS:   Please see the history of present illness.    Positive for generalized fatigue, napping more often in the afternoons.  All other systems reviewed and are negative.  EKGs/Labs/Other Studies Reviewed:    The following studies were reviewed today: Echo 04/25/21:   1. Left ventricular ejection fraction, by estimation, is 55 to 60%. The  left ventricle has normal function. The left ventricle has no regional  wall motion abnormalities. Left ventricular diastolic parameters are  consistent with Grade I diastolic  dysfunction (impaired relaxation).   2. Right ventricular systolic function is normal. The right ventricular  size is normal. There is normal pulmonary artery systolic pressure. The  estimated right ventricular systolic pressure is 12.4 mmHg.   3. Moderate elongation of the mitral anterior and posterior leaflets. No  prolapse  per current criteria noted.   4. The mitral valve is abnormal. trivial to mild mitral valve  regurgitation.   5. The aortic valve is tricuspid. Aortic valve regurgitation is mild.   6. The inferior vena cava is normal in size with greater than 50%  respiratory variability, suggesting right atrial pressure of 3 mmHg.   Comparison(s): Changes from prior study are noted. 05/01/2019: LVEF  50-55%, mild AI.   Stress Echo 04/13/2020: IMPRESSIONS     1. This is a negative stress echocardiogram for ischemia.   2. This is a low risk study.   FINDINGS   Exam Protocol: The patient exercised on a treadmill according to a Bruce  protocol.      Patient  Performance: The patient exercised for 9 minutes and 45 seconds,  achieving 11 METS. The maximum stage achieved was IV of the Bruce  protocol. The baseline heart rate was 68 bpm. The heart rate at peak  stress was 139 bpm. The target heart rate was  calculated to be 123 bpm. The percentage of maximum predicted heart rate  achieved was 96.0 %. The baseline blood pressure was 134/74 mmHg. The  blood pressure at peak stress was 175/93 mmHg. The blood pressure response  was normal. The patient developed  chest pain, shortness of breath and fatigue during the stress exam.     EKG: Resting EKG showed normal sinus rhythm with no abnormal findings. The  patient developed no abnormal EKG findings during exercise.      2D Echo Findings: The baseline ejection fraction was 60%. The peak  ejection fraction at stress was 75%. Baseline regional wall motion  abnormalities were not present. There were no stress-induced wall motion  abnormalities. This is a negative stress  echocardiogram for ischemia.     Additional Findings: Negative, adequate stress test. Excellent exercise  capacity.     Coronary CTA 06/18/2017: Non-cardiac: See separate report from [facility].   Calcium Score:  90 Agatston units   Coronary Arteries: Right dominant with  no anomalies   LM:  Calcified plaque with mild stenosis.   LAD system: Moderate D1 without significant disease. Mixed plaque in the proximal LAD with mild stenosis. The proximal LAD then passes intramyocardially (bridging) for a short segment.   Circumflex system: Mixed plaque with mild stenosis in the proximal vessel.   RCA system:  No significant disease.   IMPRESSION: 1. Coronary artery calcium score 90 Agatston units placing the patient in the 38th percentile for age and gender.   2.  Nonobstructive coronary disease.   3.  Small intramyocardial segment in the proximal LAD.  EKG:  EKG is ordered today.  The ekg ordered today demonstrates marked sinus bradycardia 45 bpm, first degree AV block PR 238 ms.   Recent Labs: No results found for requested labs within last 8760 hours.  Recent Lipid Panel    Component Value Date/Time   CHOL 124 05/15/2013 0926   TRIG 77.0 05/15/2013 0926   HDL 34.70 (L) 05/15/2013 0926   CHOLHDL 4 05/15/2013 0926   VLDL 15.4 05/15/2013 0926   LDLCALC 74 05/15/2013 0926     Risk Assessment/Calculations:           Physical Exam:    VS:  BP 118/70   Pulse (!) 45   Ht 5\' 6"  (1.676 m)   Wt 166 lb 12.8 oz (75.7 kg)   SpO2 95%   BMI 26.92 kg/m     Wt Readings from Last 3 Encounters:  04/27/21 166 lb 12.8 oz (75.7 kg)  06/17/20 170 lb (77.1 kg)  04/28/20 171 lb 3.2 oz (77.7 kg)     GEN:  Well nourished, well developed in no acute distress HEENT: Normal NECK: No JVD; No carotid bruits LYMPHATICS: No lymphadenopathy CARDIAC: RRR, no murmurs, rubs, gallops RESPIRATORY:  Clear to auscultation without rales, wheezing or rhonchi  ABDOMEN: Soft, non-tender, non-distended MUSCULOSKELETAL:  No edema; No deformity  SKIN: Warm and dry NEUROLOGIC:  Alert and oriented x 3 PSYCHIATRIC:  Normal affect   ASSESSMENT:    1. Coronary artery disease involving native coronary artery of native heart with angina pectoris ([place])   2. Mixed  hyperlipidemia   3. Bradycardia    PLAN:  In order of problems listed above:  The patient will continue his current medical program.  He has not required specific antianginal therapy.  He is not a candidate for a beta-blocker because of resting bradycardia.  We discussed consideration of a gated coronary CTA.  I reviewed his 2019 CTA and we discussed this today.  He prefers a strategy of watchful waiting since his symptoms have been very mild.  I think this is appropriate and he will notify me if symptoms worsen.  His stress test from last year is reviewed and showed no ischemia. Treated with atorvastatin 20 mg daily.  Lipids show a cholesterol of 122, HDL 36, LDL 68, triglycerides 94.  He follows a healthy lifestyle. Stable findings noted with resting heart rate 45 bpm, normal sinus with first-degree AV block.  Lightheaded spell seems consistent with a vasovagal event.  Continue to monitor.  Avoid any AV nodal blocking agents.  The patient's echo is also reviewed with him today.  The study is very reassuring with normal LV and RV function, no significant valvular disease, and grade 1 diastolic dysfunction.   Medication Adjustments/Labs and Tests Ordered: Current medicines are reviewed at length with the patient today.  Concerns regarding medicines are outlined above.  Orders Placed This Encounter  Procedures   EKG 12-Lead   Meds ordered this encounter  Medications   atorvastatin (LIPITOR) 20 MG tablet    Sig: TAKE 1 TABLET(20 MG) BY MOUTH DAILY    Dispense:  90 tablet    Refill:  3    Patient Instructions  Medication Instructions:   *If you need a refill on your cardiac medications before your next appointment, please call your pharmacy*  Lab Work: If you have labs (blood work) drawn today and your tests are completely normal, you will receive your results only by: [place] (if you have MyChart) OR A paper copy in the mail If you have any lab test that is abnormal or we  need to change your treatment, we will call you to review the results.  Testing/Procedures: None ordered today.  Follow-Up: At [facility], you and your health needs are our priority.  As part of our continuing mission to provide you with exceptional heart care, we have created designated Provider Care Teams.  These Care Teams include your primary Cardiologist (physician) and Advanced Practice Providers (APPs -  Physician Assistants and Nurse Practitioners) who all work together to provide you with the care you need, when you need it.  We recommend signing up for the patient portal called "MyChart".  Sign up information is provided on this After Visit Summary.  MyChart is used to connect with patients for Virtual Visits (Telemedicine).  Patients are able to view lab/test results, encounter notes, upcoming appointments, etc.  Non-urgent messages can be sent to your provider as well.   To learn more about what you can do with MyChart, go to [email or web address].    Your next appointment:   12 month(s)  The format for your next appointment:   In Person  Provider:   Sherren Mocha, MD     Signed, Sherren Mocha, MD  04/27/2021 6:23 PM    [place]

## 2021-04-28 DIAGNOSIS — G8929 Other chronic pain: Secondary | ICD-10-CM | POA: Diagnosis not present

## 2021-04-28 DIAGNOSIS — N138 Other obstructive and reflux uropathy: Secondary | ICD-10-CM | POA: Diagnosis not present

## 2021-06-02 DIAGNOSIS — R251 Tremor, unspecified: Secondary | ICD-10-CM | POA: Diagnosis not present

## 2021-06-02 DIAGNOSIS — R253 Fasciculation: Secondary | ICD-10-CM | POA: Diagnosis not present

## 2021-06-02 DIAGNOSIS — M2560 Stiffness of unspecified joint, not elsewhere classified: Secondary | ICD-10-CM | POA: Diagnosis not present

## 2021-06-02 DIAGNOSIS — R252 Cramp and spasm: Secondary | ICD-10-CM | POA: Diagnosis not present

## 2021-06-10 ENCOUNTER — Encounter: Payer: Self-pay | Admitting: Neurology

## 2021-06-14 ENCOUNTER — Encounter: Payer: Self-pay | Admitting: Cardiovascular Disease

## 2021-06-14 NOTE — Progress Notes (Signed)
NEUROLOGY FOLLOW UP OFFICE NOTE  Marshal Eskew Tant [id number]  Assessment/Plan:   Muscle cramps Fasciculations There does not appear to be an electrolyte imbalance.  Nonspecific inflammatory markers as well as CK are negative.  This may represent a benign process such as cramp-fasciculation syndrome.  He is concerned about motor neuron disease.  We will check aldolase We will also check NCV-EMG of one upper and one lower extremity I have advised to increase tizanidine to 10m and he may take it up to every 6 hours as needed.  Other medication options include gabapentin and carbamazepine. Continue hydration, stretching and heating pad. Follow up after testing.  Further recommendations pending above results. 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     He reports that as a child he experienced some cramps in the calves but nothing severe.  They subsequently resolved.  He took up rowing in 2010.  He noticed that sometimes after rowing, he would experience cramping in the legs, particularly in the hamstrings and the thigh adductors.  In 2016, [place]  In the middle of the long flight, he woke up in his seat from a sleep with severe cramps in the legs.  He had to get up and walk it off up and down the aisle but it lasted 45 minutes before it finally resolved.  In the summer of 2022, the cramps became significantly worse and have since progressed.  They occur more frequently and are severely painful.  In addition to the thighs, he now experiences cramps in the lower leg muscles, ankles and feet.  Any pressure on the legs will trigger the pain, so he tried elevating his legs in bed with a pillow to prevent pressure on the hamstrings but it was ineffective.  He now cannot sleep in his bed and has started sleeping on the couch.  Therefore, the cramps primarily  occur at night.  Any physical activity with greatly trigger the severe cramps.  He is now unable to perform all of the physical activities he enjoys, such as rowing, kayaking and golfing.  He also endorses more severe tightness in his legs.  He has noticed intermittent muscle twitching involving his arms, legs and torso.  Legs feel more fatigued and reports difficulty with climbing stairs.  He notes a little bit of fatigue in the upper extremities but nothing significant.  He has tightness in the back of his neck.  Denies double vision and dysphagia.  No radicular pain or numbness.  He does report tremors in his hands.  If he is scrolling on his phone, his finger may jerk.  He tried stopping atorvastatin, which he has taken for many years, which was ineffective.  He has tried several treatment modalities which have been ineffective, such as stretching, leg cramp supplements (including Mg), mustard, heating pad and increased hydration. He did not try tonic water because he is concerned about ingesting any possible quantity of quinine.  He was started on tizanidine for which he takes 264mprimarily at bedtime.  It initially was helpful but he thinks it is steadily becoming less effective.    Labs:  CK 276, B12 757, TSH 1.54, BUN 23, Cr 1.30, Ca 9.3, sed rate <1, CRP 2, Mg 2.1.  Na and K were reportedly normal.  He has no family history of similar symptoms.  Crohn's disease runs in his family.  PAST MEDICAL HISTORY: Past Medical History:  Diagnosis Date   Allergic rhinitis    Anemia    pt unaware   Anginal pain ([place])    Microvascular angina- followed by Dr Burt Knack   Arthritis    back   Cancer [facility]    Basal cell -    Coronary artery disease    nonobstructive   DVT (deep venous thrombosis) ([place]) 03/2011   after prolonged travel   GERD (gastroesophageal reflux disease)    Headache    Vision Migraines   Hiatal hernia    small   History of rheumatic fever    questionable   Hyperlipidemia     Increased prostate specific antigen (PSA) velocity    Laryngopharyngeal reflux    Mitral valve prolapse    Palpitations    PE (pulmonary embolism) 03/2011   after prolonged travel   PONV (postoperative nausea and vomiting)    Fentyl- N/V   Pruritus    resolved   Shortness of breath dyspnea    with Exertion   Sinus bradycardia     MEDICATIONS: Current Outpatient Medications on File Prior to Visit  Medication Sig Dispense Refill   atorvastatin (LIPITOR) 20 MG tablet TAKE 1 TABLET(20 MG) BY MOUTH DAILY 90 tablet 3   azelastine (ASTELIN) 0.1 % nasal spray Place into both nostrils.     chlorpheniramine (CHLOR-TRIMETON) 2 MG/5ML syrup Take by mouth.     cholecalciferol (VITAMIN D) 1000 UNITS tablet Take 1,000 Units by mouth every morning.      ciclopirox (LOPROX) 5.85 % cream 1 application to affected area     Cyanocobalamin (VITAMIN B-12) 2500 MCG SUBL Place 1 tablet under the tongue every 7 (seven) days. Takes on Wednesday     desoximetasone (TOPICORT) 9.29 % cream 1 application to affected area     esomeprazole (NEXIUM) 40 MG capsule Take 40 mg by mouth at bedtime. (Patient not taking: Reported on 04/27/2021)     mupirocin cream (BACTROBAN) 2 % Apply to each nostril bid     Omeprazole-Sodium Bicarbonate (ZEGERID PO) Take 20 mg by mouth daily.      senna-docusate (SENOKOT-S) 8.6-50 MG tablet 2 tablet     sildenafil (REVATIO) 20 MG tablet Take by mouth.     tamsulosin (FLOMAX) 0.4 MG CAPS capsule Take 0.4 mg by mouth at bedtime.      No current facility-administered medications on file prior to visit.    ALLERGIES: Allergies  Allergen Reactions   Penicillins Anaphylaxis, Other (See Comments) and Hives    Hands swell, throat swells Hands swell, throat swells No reaction listed Hives (pt clarified at office visit)   Fentanyl Nausea And Vomiting and Other (See Comments)   Imdur [Isosorbide Dinitrate] Other (See Comments)    Headache    FAMILY HISTORY: Family History  Problem  Relation Age of Onset   Cancer Other        mother (BrCa and glioblastoma) and brother (GE junction)   CAD Other 30       father died suddenly in the setting of anginal symptoms      Objective:  Blood pressure 140/61, pulse (!) 54, height '5\' 6"'  (1.676 m), weight 169 lb 12.8 oz (77 kg), SpO2 92 %. General: No acute distress.  Patient appears well-groomed.   Head:  Normocephalic/atraumatic Eyes:  Fundi examined but not visualized Neck: supple, no paraspinal tenderness, full range of motion Heart:  Regular rate and rhythm Lungs:  Clear to auscultation bilaterally Back: No paraspinal tenderness Neurological Exam: alert and oriented to person, place, and time.  Speech fluent and not dysarthric, language intact.  CN II-XII intact. Bulk and tone normal, fasciculations not seen, muscle strength 5/5 throughout.  Sensation to pinprick and vibration intact.  Deep tendon reflexes 2+ throughout, toes downgoing.  Finger to nose testing intact.  Gait normal, able to walk on toes, heels and in tandem.  Romberg negative.   Metta Clines, DO  CC: Lajean Manes, MD

## 2021-06-15 ENCOUNTER — Encounter: Payer: Self-pay | Admitting: Neurology

## 2021-06-15 ENCOUNTER — Other Ambulatory Visit: Payer: Self-pay

## 2021-06-15 ENCOUNTER — Ambulatory Visit: Payer: Medicare Other | Admitting: Neurology

## 2021-06-15 ENCOUNTER — Other Ambulatory Visit (INDEPENDENT_AMBULATORY_CARE_PROVIDER_SITE_OTHER): Payer: Medicare Other

## 2021-06-15 VITALS — BP 140/61 | HR 54 | Ht 66.0 in | Wt 169.8 lb

## 2021-06-15 DIAGNOSIS — R252 Cramp and spasm: Secondary | ICD-10-CM

## 2021-06-15 DIAGNOSIS — R253 Fasciculation: Secondary | ICD-10-CM

## 2021-06-15 MED ORDER — TIZANIDINE HCL 4 MG PO TABS: 4.0000 mg | ORAL_TABLET | Freq: Four times a day (QID) | ORAL | 2 refills | Status: DC | PRN

## 2021-06-15 NOTE — Patient Instructions (Addendum)
Take tizanidine 4mg  at directed Check aldolase Check nerve conduction study - EMG of right arm and leg. Further recommendations pending results.  Follow up after testing.

## 2021-06-16 ENCOUNTER — Encounter: Payer: Self-pay | Admitting: Neurology

## 2021-06-16 LAB — ALDOLASE: Aldolase: 1.8 U/L (ref <=8.1)

## 2021-06-17 ENCOUNTER — Ambulatory Visit
Admission: RE | Admit: 2021-06-17 | Discharge: 2021-06-17 | Disposition: A | Discharge status: Home / Self Care | Payer: Medicare Other | Source: Ambulatory Visit | Attending: Geriatric Medicine | Admitting: Geriatric Medicine

## 2021-06-17 ENCOUNTER — Other Ambulatory Visit: Payer: Self-pay | Admitting: Geriatric Medicine

## 2021-06-17 DIAGNOSIS — R252 Cramp and spasm: Secondary | ICD-10-CM | POA: Diagnosis not present

## 2021-06-17 DIAGNOSIS — R0609 Other forms of dyspnea: Secondary | ICD-10-CM | POA: Diagnosis not present

## 2021-06-21 ENCOUNTER — Other Ambulatory Visit: Payer: Self-pay

## 2021-06-21 ENCOUNTER — Ambulatory Visit: Payer: Medicare Other | Admitting: Neurology

## 2021-06-21 DIAGNOSIS — R252 Cramp and spasm: Secondary | ICD-10-CM | POA: Diagnosis not present

## 2021-06-21 DIAGNOSIS — R253 Fasciculation: Secondary | ICD-10-CM

## 2021-06-21 NOTE — Procedures (Signed)
[facility]  [address], [address]  [place], [place] [address] Tel: [phone] Fax:  [phone] Test Date:  06/21/2021  Patient: Nathaniel [name] DOB: 1945-07-17 Physician: Narda Amber, DO  Sex: Male Height: 5\' 6"  Ref Phys: Metta Clines, D.O.  ID#: [id number]   Technician:    Patient Complaints: This is a 76 year old man referred for evaluation of muscle cramps and twitches.   NCV & EMG Findings: Extensive electrodiagnostic testing of the right upper and lower extremity shows:  All sensory responses including the right median, ulnar, sural, and superficial peroneal nerves are within normal limits. All motor responses including the right median, ulnar, peroneal, and tibial nerves are within normal limits. Right tibial H reflex studies within normal limits. There is no evidence of active or chronic motor axonal loss changes affecting any of the tested muscles.  Motor unit configuration and recruitment pattern is within normal limits.  Fasciculations were not seen in any of the tested muscles.   Impression: This is a normal study of the right upper and lower extremities.  In particular, there is no evidence of a large fiber sensorimotor polyneuropathy, cervical/lumbosacral radiculopathy, or widespread disorder of anterior horn cells.   ___________________________ Narda Amber, DO    Nerve Conduction Studies Anti Sensory Summary Table   Stim Site NR Peak (ms) Norm Peak (ms) P-T Amp (V) Norm P-T Amp  Right Median Anti Sensory (2nd Digit)  32C  Wrist    3.4 <3.8 15.4 >10  Right Sup Peroneal Anti Sensory (Ant Lat Mall)  32C  12 cm    2.5 <4.6 8.5 >3  Right Sural Anti Sensory (Lat Mall)  32C  Calf    3.5 <4.6 15.7 >3  Right Ulnar Anti Sensory (5th Digit)  32C  Wrist    3.2 <3.2 23.8 >5   Motor Summary Table   Stim Site NR Onset (ms) Norm Onset (ms) O-P Amp (mV) Norm O-P Amp Site1 Site2 Delta-0 (ms) Dist (cm) Vel (m/s) Norm Vel (m/s)  Right Median Motor (Abd  Poll Brev)  32C  Wrist    3.2 <4.0 10.8 >5 Elbow Wrist 5.0 29.0 58 >50  Elbow    8.2  10.8         Right Peroneal Motor (Ext Dig Brev)  32C  Ankle    2.7 <6.0 3.7 >2.5 B Fib Ankle 7.4 35.0 47 >40  B Fib    10.1  3.4  Poplt B Fib 1.4 8.0 57 >40  Poplt    11.5  3.4         Right Tibial Motor (Abd Hall Brev)  32C  Ankle    3.6 <6.0 9.8 >4 Knee Ankle 7.7 43.0 56 >40  Knee    11.3  7.5         Right Ulnar Motor (Abd Dig Minimi)  32C  Wrist    2.7 <3.1 10.7 >7 B Elbow Wrist 4.6 24.0 52 >50  B Elbow    7.3  10.4  A Elbow B Elbow 1.5 10.0 67 >50  A Elbow    8.8  10.3          H Reflex Studies   NR H-Lat (ms) Lat Norm (ms) L-R H-Lat (ms)  Right Tibial (Gastroc)  32C     33.06 <35    EMG   Side Muscle Ins Act Fibs Psw Fasc Number Recrt Dur Dur. Amp Amp. Poly Poly. Comment  Right AntTibialis Nml Nml Nml Nml Nml Nml Nml Nml  Nml Nml Nml Nml N/A  Right Gastroc Nml Nml Nml Nml Nml Nml Nml Nml Nml Nml Nml Nml N/A  Right Flex Dig Long Nml Nml Nml Nml Nml Nml Nml Nml Nml Nml Nml Nml N/A  Right RectFemoris Nml Nml Nml Nml Nml Nml Nml Nml Nml Nml Nml Nml N/A  Right GluteusMed Nml Nml Nml Nml Nml Nml Nml Nml Nml Nml Nml Nml N/A  Right 1stDorInt Nml Nml Nml Nml Nml Nml Nml Nml Nml Nml Nml Nml N/A  Right PronatorTeres Nml Nml Nml Nml Nml Nml Nml Nml Nml Nml Nml Nml N/A  Right Biceps Nml Nml Nml Nml Nml Nml Nml Nml Nml Nml Nml Nml N/A  Right Triceps Nml Nml Nml Nml Nml Nml Nml Nml Nml Nml Nml Nml N/A  Right Deltoid Nml Nml Nml Nml Nml Nml Nml Nml Nml Nml Nml Nml N/A      Waveforms:

## 2021-06-22 DIAGNOSIS — H02401 Unspecified ptosis of right eyelid: Secondary | ICD-10-CM | POA: Diagnosis not present

## 2021-06-22 DIAGNOSIS — R253 Fasciculation: Secondary | ICD-10-CM | POA: Diagnosis not present

## 2021-06-22 DIAGNOSIS — R29898 Other symptoms and signs involving the musculoskeletal system: Secondary | ICD-10-CM | POA: Diagnosis not present

## 2021-06-22 DIAGNOSIS — R531 Weakness: Secondary | ICD-10-CM | POA: Diagnosis not present

## 2021-06-22 DIAGNOSIS — R7309 Other abnormal glucose: Secondary | ICD-10-CM | POA: Diagnosis not present

## 2021-06-22 DIAGNOSIS — R292 Abnormal reflex: Secondary | ICD-10-CM | POA: Diagnosis not present

## 2021-06-22 NOTE — Progress Notes (Signed)
Pt advised of his lab results.

## 2021-06-29 DIAGNOSIS — L438 Other lichen planus: Secondary | ICD-10-CM | POA: Diagnosis not present

## 2021-06-29 DIAGNOSIS — L820 Inflamed seborrheic keratosis: Secondary | ICD-10-CM | POA: Diagnosis not present

## 2021-06-29 DIAGNOSIS — L821 Other seborrheic keratosis: Secondary | ICD-10-CM | POA: Diagnosis not present

## 2021-06-29 DIAGNOSIS — Z85828 Personal history of other malignant neoplasm of skin: Secondary | ICD-10-CM | POA: Diagnosis not present

## 2021-06-29 DIAGNOSIS — L309 Dermatitis, unspecified: Secondary | ICD-10-CM | POA: Diagnosis not present

## 2021-07-03 ENCOUNTER — Encounter: Payer: Self-pay | Admitting: Cardiovascular Disease

## 2021-07-05 ENCOUNTER — Encounter: Payer: Self-pay | Admitting: Cardiovascular Disease

## 2021-07-05 DIAGNOSIS — R531 Weakness: Secondary | ICD-10-CM | POA: Diagnosis not present

## 2021-07-05 DIAGNOSIS — R079 Chest pain, unspecified: Secondary | ICD-10-CM

## 2021-07-05 DIAGNOSIS — E782 Mixed hyperlipidemia: Secondary | ICD-10-CM

## 2021-07-05 DIAGNOSIS — Z01810 Encounter for preprocedural cardiovascular examination: Secondary | ICD-10-CM

## 2021-07-05 DIAGNOSIS — R292 Abnormal reflex: Secondary | ICD-10-CM | POA: Diagnosis not present

## 2021-07-05 DIAGNOSIS — R252 Cramp and spasm: Secondary | ICD-10-CM | POA: Diagnosis not present

## 2021-07-05 DIAGNOSIS — M62838 Other muscle spasm: Secondary | ICD-10-CM | POA: Diagnosis not present

## 2021-07-05 DIAGNOSIS — R253 Fasciculation: Secondary | ICD-10-CM | POA: Diagnosis not present

## 2021-07-05 DIAGNOSIS — I25119 Atherosclerotic heart disease of native coronary artery with unspecified angina pectoris: Secondary | ICD-10-CM

## 2021-07-06 DIAGNOSIS — M47812 Spondylosis without myelopathy or radiculopathy, cervical region: Secondary | ICD-10-CM | POA: Diagnosis not present

## 2021-07-06 DIAGNOSIS — R292 Abnormal reflex: Secondary | ICD-10-CM | POA: Diagnosis not present

## 2021-07-06 DIAGNOSIS — M4802 Spinal stenosis, cervical region: Secondary | ICD-10-CM | POA: Diagnosis not present

## 2021-07-06 DIAGNOSIS — M542 Cervicalgia: Secondary | ICD-10-CM | POA: Diagnosis not present

## 2021-07-13 ENCOUNTER — Ambulatory Visit: Payer: Medicare Other | Admitting: Neurology

## 2021-07-14 ENCOUNTER — Telehealth: Payer: Self-pay | Admitting: Cardiovascular Disease

## 2021-07-14 NOTE — Telephone Encounter (Signed)
Spoke with the patient who is following up in regards to his MyChart message sent earlier this week. Patient would like to know what his next steps are in regards to his cholesterol. He has been off of Lipitor for 4 weeks. He states symptoms have improved although he still has some neuromuscular issues going on that he is seeing neurology for.  He would like to know whether he needs to try another medication for his cholesterol - he does not want to go back on Lipitor. Advised that I would check with Dr. Burt Knack and that he may benefit from seeing PharmD in the lipid clinic.  Patient also states that there are several drugs that his neurologist mentioned that he could try, however the neurologist suggested that the patient talk with cardiology to see what would be best for him and would not cause any further cardiac issues. Patient has had concerns of bradycardia recently and does not want to take anything that would drop his HR more. The medications that his neurologist mentioned were Baclofen, Lyrica, Flexeril, and Tegretol.  Advised patient that I would let Dr. Burt Knack and our pharmacy team know about his concerns about would get back with him with any recommendations.

## 2021-07-14 NOTE — Telephone Encounter (Signed)
See MyChart note. Please refer to [facility]. Thank you!

## 2021-07-14 NOTE — Telephone Encounter (Signed)
Patient is calling about his message he sent via [place]. He states he hasn't received a reply back. He has several questions that needs to be address.

## 2021-07-15 DIAGNOSIS — M4316 Spondylolisthesis, lumbar region: Secondary | ICD-10-CM | POA: Diagnosis not present

## 2021-07-15 DIAGNOSIS — R531 Weakness: Secondary | ICD-10-CM | POA: Diagnosis not present

## 2021-07-15 DIAGNOSIS — M48061 Spinal stenosis, lumbar region without neurogenic claudication: Secondary | ICD-10-CM | POA: Diagnosis not present

## 2021-07-15 MED ORDER — ROSUVASTATIN CALCIUM 10 MG PO TABS: 10.0000 mg | ORAL_TABLET | Freq: Every day | ORAL | 3 refills | Status: DC

## 2021-07-15 NOTE — Telephone Encounter (Signed)
Rx for crestor 10 mg has been sent in. Referral to PharmD has been placed.

## 2021-07-15 NOTE — Telephone Encounter (Signed)
Please call to schedule patient at [facility] or [facility]

## 2021-07-18 NOTE — Telephone Encounter (Signed)
Already scheduled for: Next Appt With Cardiology ([facility]) 08/10/2021 at 2:30 PM

## 2021-07-25 DIAGNOSIS — M4802 Spinal stenosis, cervical region: Secondary | ICD-10-CM | POA: Diagnosis not present

## 2021-07-25 DIAGNOSIS — M40202 Unspecified kyphosis, cervical region: Secondary | ICD-10-CM | POA: Diagnosis not present

## 2021-08-02 ENCOUNTER — Ambulatory Visit: Payer: Medicare Other | Admitting: Neurology

## 2021-08-10 ENCOUNTER — Ambulatory Visit: Payer: Medicare Other

## 2021-08-10 DIAGNOSIS — M791 Myalgia, unspecified site: Secondary | ICD-10-CM | POA: Diagnosis not present

## 2021-08-10 DIAGNOSIS — R7303 Prediabetes: Secondary | ICD-10-CM | POA: Diagnosis not present

## 2021-08-11 ENCOUNTER — Telehealth: Payer: Self-pay | Admitting: Cardiovascular Disease

## 2021-08-11 NOTE — Telephone Encounter (Signed)
Already addressed via MyChart encounter-see documentation. ?MD aware and CTA likely to be ordered. ?

## 2021-08-11 NOTE — Telephone Encounter (Signed)
08/10/21 ?Nathaniel [name], ?I've been having more frequent and severe angina with relatively mild exertion over the past 2 weeks.  Tonight while practicing my short golf game I had an episode which lasted 10-15 minutes and was more severe than what I have experienced during the past few years despite my rowing and fairly active life style.  You may recall that my symptoms started about 15-20 years ago.  I had a CT angiogram in January 2019 which showed a mild stenosis of the left main and proximal circumflex arteries.  I will try to contact you in the morning (08/11/21) in the hope I can get an appointment with you fairly soon and to get an appropriate study carried out soon.  Thanks. ?  ?[name] ?

## 2021-08-11 NOTE — Telephone Encounter (Signed)
Called and spoke to patient regarding symptoms of angina. Pt states that his symptoms are occurring more frequently, lasting longer in duration, and occur with even mild exertion. Pt denies SOB or radiation of pain, states that while he is concerned, he doesn't feel it merits a visit to the ED. Pt wishes to move forward with proposed repeat CTA that he discussed with Dr Burt Knack at [facility] on 11/22. See Cooper's note below: ? ?We discussed consideration of a gated coronary CTA.  I reviewed his 2019 CTA and we discussed this today.  He prefers a strategy of watchful waiting since his symptoms have been very mild.  I think this is appropriate and he will notify me if symptoms worsen.  His stress test from last year is reviewed and showed no ischemia. ? ?Will route to MD for approval. Pt aware and awaits instruction. ?

## 2021-08-12 ENCOUNTER — Other Ambulatory Visit: Payer: Self-pay | Admitting: Cardiovascular Disease

## 2021-08-12 ENCOUNTER — Other Ambulatory Visit: Payer: Medicare Other | Admitting: *Deleted

## 2021-08-12 ENCOUNTER — Other Ambulatory Visit: Payer: Self-pay

## 2021-08-12 DIAGNOSIS — Z01810 Encounter for preprocedural cardiovascular examination: Secondary | ICD-10-CM | POA: Diagnosis not present

## 2021-08-12 DIAGNOSIS — I25119 Atherosclerotic heart disease of native coronary artery with unspecified angina pectoris: Secondary | ICD-10-CM | POA: Diagnosis not present

## 2021-08-12 NOTE — Telephone Encounter (Signed)
Order placed for CT at this time and instructions sent via [place]. Pre-procedural labs ordered and pt intends to come today for them. Called and spoke to patient to make him aware. He is very eager and anxiously awaiting call to schedule. Understands directions. Message sent to Marchia Bond to relay request for additional FFR.  ?

## 2021-08-12 NOTE — Addendum Note (Signed)
Addended by: Ma Hillock on: 08/12/2021 10:02 AM ? ? Modules accepted: Orders ? ?

## 2021-08-13 LAB — BASIC METABOLIC PANEL
BUN/Creatinine Ratio: 21 (ref 10–24)
BUN: 23 mg/dL (ref 8–27)
CO2: 24 mmol/L (ref 20–29)
Calcium: 9 mg/dL (ref 8.6–10.2)
Chloride: 105 mmol/L (ref 96–106)
Creatinine, Ser: 1.11 mg/dL (ref 0.76–1.27)
Glucose: 84 mg/dL (ref 70–99)
Potassium: 4.4 mmol/L (ref 3.5–5.2)
Sodium: 141 mmol/L (ref 134–144)
eGFR: 69 mL/min/{1.73_m2} (ref >59)

## 2021-08-22 ENCOUNTER — Telehealth (HOSPITAL_COMMUNITY): Payer: Self-pay | Admitting: Emergency Medicine

## 2021-08-22 NOTE — Telephone Encounter (Signed)
Attempted to call patient regarding upcoming cardiac CT appointment. °Left message on voicemail with name and callback number °Jordell Outten RN Navigator Cardiac Imaging °[facility] °[phone] Office °[phone] Cell ° °

## 2021-08-22 NOTE — Telephone Encounter (Signed)
Reaching out to patient to offer assistance regarding upcoming cardiac imaging study; pt verbalizes understanding of appt date/time, parking situation and where to check in, pre-test NPO status and medications ordered, and verified current allergies; name and call back number provided for further questions should they arise ?Marchia Bond RN Navigator Cardiac Imaging ?[facility] ?[phone] office ?[phone] cell ? ?Daily meds, hold sildenafil ?Denies iv issues ?Arrival 400 ? ?

## 2021-08-23 ENCOUNTER — Ambulatory Visit (HOSPITAL_COMMUNITY)
Admission: RE | Admit: 2021-08-23 | Discharge: 2021-08-23 | Disposition: A | Discharge status: Home / Self Care | Payer: Medicare Other | Source: Ambulatory Visit | Attending: Cardiovascular Disease | Admitting: Cardiovascular Disease

## 2021-08-23 ENCOUNTER — Other Ambulatory Visit: Payer: Self-pay

## 2021-08-23 ENCOUNTER — Encounter: Payer: Self-pay | Admitting: Cardiovascular Disease

## 2021-08-23 DIAGNOSIS — I25119 Atherosclerotic heart disease of native coronary artery with unspecified angina pectoris: Secondary | ICD-10-CM | POA: Insufficient documentation

## 2021-08-23 DIAGNOSIS — R079 Chest pain, unspecified: Secondary | ICD-10-CM | POA: Insufficient documentation

## 2021-08-23 MED ORDER — NITROGLYCERIN 0.4 MG SL SUBL
SUBLINGUAL_TABLET | SUBLINGUAL | Status: AC
  Filled 2021-08-23: qty 2

## 2021-08-23 MED ORDER — NITROGLYCERIN 0.4 MG SL SUBL
0.8000 mg | SUBLINGUAL_TABLET | Freq: Once | SUBLINGUAL | Status: AC
  Administered 2021-08-23: 0.8 mg via SUBLINGUAL

## 2021-08-23 MED ORDER — IOHEXOL 350 MG/ML SOLN
95.0000 mL | Freq: Once | INTRAVENOUS | Status: AC | PRN
  Administered 2021-08-23: 95 mL via INTRAVENOUS

## 2021-09-06 ENCOUNTER — Ambulatory Visit: Payer: Medicare Other | Admitting: Neurology

## 2021-09-27 DIAGNOSIS — Z85828 Personal history of other malignant neoplasm of skin: Secondary | ICD-10-CM | POA: Diagnosis not present

## 2021-09-27 DIAGNOSIS — L821 Other seborrheic keratosis: Secondary | ICD-10-CM | POA: Diagnosis not present

## 2021-09-27 DIAGNOSIS — D2261 Melanocytic nevi of right upper limb, including shoulder: Secondary | ICD-10-CM | POA: Diagnosis not present

## 2021-09-27 DIAGNOSIS — D225 Melanocytic nevi of trunk: Secondary | ICD-10-CM | POA: Diagnosis not present

## 2021-09-27 DIAGNOSIS — L570 Actinic keratosis: Secondary | ICD-10-CM | POA: Diagnosis not present

## 2021-10-07 DIAGNOSIS — H6123 Impacted cerumen, bilateral: Secondary | ICD-10-CM | POA: Diagnosis not present

## 2021-10-07 DIAGNOSIS — H9313 Tinnitus, bilateral: Secondary | ICD-10-CM | POA: Diagnosis not present

## 2021-10-07 DIAGNOSIS — H903 Sensorineural hearing loss, bilateral: Secondary | ICD-10-CM | POA: Diagnosis not present

## 2021-10-07 DIAGNOSIS — R252 Cramp and spasm: Secondary | ICD-10-CM | POA: Diagnosis not present

## 2021-10-12 DIAGNOSIS — G8929 Other chronic pain: Secondary | ICD-10-CM | POA: Diagnosis not present

## 2021-10-12 DIAGNOSIS — K219 Gastro-esophageal reflux disease without esophagitis: Secondary | ICD-10-CM | POA: Diagnosis not present

## 2021-10-12 DIAGNOSIS — R253 Fasciculation: Secondary | ICD-10-CM | POA: Diagnosis not present

## 2021-10-12 DIAGNOSIS — R252 Cramp and spasm: Secondary | ICD-10-CM | POA: Diagnosis not present

## 2021-10-12 DIAGNOSIS — N138 Other obstructive and reflux uropathy: Secondary | ICD-10-CM | POA: Diagnosis not present

## 2021-10-12 DIAGNOSIS — R531 Weakness: Secondary | ICD-10-CM | POA: Diagnosis not present

## 2021-10-12 DIAGNOSIS — R292 Abnormal reflex: Secondary | ICD-10-CM | POA: Diagnosis not present

## 2021-10-12 DIAGNOSIS — M791 Myalgia, unspecified site: Secondary | ICD-10-CM | POA: Diagnosis not present

## 2021-10-12 DIAGNOSIS — Z79899 Other long term (current) drug therapy: Secondary | ICD-10-CM | POA: Diagnosis not present

## 2021-10-14 DIAGNOSIS — E559 Vitamin D deficiency, unspecified: Secondary | ICD-10-CM | POA: Diagnosis not present

## 2021-10-14 DIAGNOSIS — R252 Cramp and spasm: Secondary | ICD-10-CM | POA: Diagnosis not present

## 2021-10-17 ENCOUNTER — Ambulatory Visit: Payer: Medicare Other | Admitting: Cardiovascular Disease

## 2021-10-17 ENCOUNTER — Ambulatory Visit (INDEPENDENT_AMBULATORY_CARE_PROVIDER_SITE_OTHER): Payer: Medicare Other

## 2021-10-17 ENCOUNTER — Encounter: Payer: Self-pay | Admitting: Cardiovascular Disease

## 2021-10-17 VITALS — BP 110/70 | HR 45 | Ht 66.0 in | Wt 167.0 lb

## 2021-10-17 DIAGNOSIS — I25119 Atherosclerotic heart disease of native coronary artery with unspecified angina pectoris: Secondary | ICD-10-CM

## 2021-10-17 DIAGNOSIS — R002 Palpitations: Secondary | ICD-10-CM | POA: Diagnosis not present

## 2021-10-17 DIAGNOSIS — E782 Mixed hyperlipidemia: Secondary | ICD-10-CM

## 2021-10-17 DIAGNOSIS — R001 Bradycardia, unspecified: Secondary | ICD-10-CM | POA: Diagnosis not present

## 2021-10-17 NOTE — Progress Notes (Unsigned)
Enrolled for [employer] to mail a ZIO XT long term holter monitor to the patients address on file.  

## 2021-10-17 NOTE — Progress Notes (Signed)
?Cardiology Office Note:   ? ?Date:  10/26/2021  ? ?ID:  BLU LORI, DOB 07-21-1945, MRN [id number] ? ?PCP:  Lajean Manes, MD ?  ?[facility] Providers ?Cardiologist:  Sherren Mocha, MD    ? ?Referring MD: Lajean Manes, MD  ? ?Chief Complaint  ?Patient presents with  ? Coronary Artery Disease  ? ? ?History of Present Illness:   ? ?Nathaniel [name] is a 76 y.o. male with a hx of nonobstructive coronary artery disease, microvascular angina, mixed hyperlipidemia, and chronic bradycardia, presenting for follow-up evaluation.  He was last seen in November 2022. ? ?The patient is here alone today.  He has had recent problems with heart palpitations.  He has occasional lightheadedness.  No recent episodes of presyncope or frank syncope.  He has not had any recent problems with angina.  He denies orthopnea, PND, or leg swelling.  The patient is compliant with his medications.  He remains physically active but is not getting as much exercise as he has in the past. ? ?Past Medical History:  ?Diagnosis Date  ? Allergic rhinitis   ? Anemia   ? pt unaware  ? Anginal pain ([place])   ? Microvascular angina- followed by Dr Burt Knack  ? Arthritis   ? back  ? Cancer [facility])   ? Basal cell -   ? Coronary artery disease   ? nonobstructive  ? DVT (deep venous thrombosis) ([place]) 03/2011  ? after prolonged travel  ? GERD (gastroesophageal reflux disease)   ? Headache   ? Vision Migraines  ? Hiatal hernia   ? small  ? History of rheumatic fever   ? questionable  ? Hyperlipidemia   ? Increased prostate specific antigen (PSA) velocity   ? Laryngopharyngeal reflux   ? Mitral valve prolapse   ? Palpitations   ? PE (pulmonary embolism) 03/2011  ? after prolonged travel  ? PONV (postoperative nausea and vomiting)   ? Fentyl- N/V  ? Pruritus   ? resolved  ? Shortness of breath dyspnea   ? with Exertion  ? Sinus bradycardia   ? ? ?Past Surgical History:  ?Procedure Laterality Date  ? COLONOSCOPY    ? COLONOSCOPY N/A 02/24/2015  ? Procedure:  COLONOSCOPY;  Surgeon: Ronald Lobo, MD;  Location: [facility] ENDOSCOPY;  Service: Endoscopy;  Laterality: N/A;  ? CYSTOSCOPY    ? ESOPHAGOGASTRODUODENOSCOPY N/A 02/06/2013  ? Procedure: ESOPHAGOGASTRODUODENOSCOPY (EGD);  Surgeon: Cleotis Nipper, MD;  Location: [facility] ENDOSCOPY;  Service: Endoscopy;  Laterality: N/A;  ? ESOPHAGOGASTRODUODENOSCOPY N/A 02/24/2015  ? Procedure: ESOPHAGOGASTRODUODENOSCOPY (EGD);  Surgeon: Ronald Lobo, MD;  Location: [facility] ENDOSCOPY;  Service: Endoscopy;  Laterality: N/A;  ? HUMERUS FRACTURE SURGERY Right 05/2003  ? Repair of an AC seperation of the left    ? SHOULDER ARTHROSCOPY Left   ? A/C tear  ? TONSILLECTOMY AND ADENOIDECTOMY    ? ? ?Current Medications: ?Current Meds  ?Medication Sig  ? azelastine (ASTELIN) 0.1 % nasal spray Place into both nostrils as needed.  ? chlorpheniramine (CHLOR-TRIMETON) 2 MG/5ML syrup Take by mouth 2 (two) times daily.  ? cholecalciferol (VITAMIN D) 1000 UNITS tablet Take 1,000 Units by mouth every morning.   ? ciclopirox (LOPROX) 0.77 % cream as needed.  ? Cyanocobalamin (VITAMIN B-12) 2500 MCG SUBL Place 1 tablet under the tongue 3 (three) times a week. Takes on Wednesday  ? desoximetasone (TOPICORT) 0.25 % cream as needed.  ? esomeprazole (NEXIUM) 40 MG capsule Take 40 mg by mouth at bedtime.  ?  mupirocin cream (BACTROBAN) 2 % as needed.  ? Omeprazole-Sodium Bicarbonate (ZEGERID PO) Take 20 mg by mouth daily.   ? senna-docusate (SENOKOT-S) 8.6-50 MG tablet 1 tablet 2 (two) times daily.  ? sildenafil (REVATIO) 20 MG tablet Take by mouth as needed.  ? tamsulosin (FLOMAX) 0.4 MG CAPS capsule Take 0.4 mg by mouth at bedtime.   ? triamcinolone cream (KENALOG) 0.1 % SMARTSIG:1 Sparingly Topical Twice Daily  ?  ? ?Allergies:   Penicillins, Fentanyl, and Imdur [isosorbide dinitrate]  ? ?Social History  ? ?Socioeconomic History  ? Marital status: Married  ?  Spouse name: Not on file  ? Number of children: Not on file  ? Years of education: Not on file  ? Highest  education level: Not on file  ?Occupational History  ? Occupation: retired  ?Tobacco Use  ? Smoking status: Never  ? Smokeless tobacco: Never  ?Substance and Sexual Activity  ? Alcohol use: Yes  ?  Comment: occasional- special ocassional  ? Drug use: No  ? Sexual activity: Not on file  ?Other Topics Concern  ? Not on file  ?Social History Narrative  ? Pt lives in [place] with wife and daughter (has autism).  Son is an anesthesia resident in [place].  ? Retired Materials engineer in 2014.    ? 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  ? ?Social Determinants of Health  ? ?Financial Resource Strain: Not on file  ?Food Insecurity: Not on file  ?Transportation Needs: Not on file  ?Physical Activity: Not on file  ?Stress: Not on file  ?Social Connections: Not on file  ?  ? ?Family History: ?The patient's family history includes CAD (age of onset: 62) in an other family member; Cancer in an other family member; Migraines in his mother. ? ?ROS:   ?Please see the history of present illness.    ?All other systems reviewed and are negative. ? ?EKGs/Labs/Other Studies Reviewed:   ? ?The following studies were reviewed today: ?Coronary CTA: ?Left main: The left main is a large caliber vessel with a normal ?take off from the left coronary cusp that bifurcates to form a left ?anterior descending artery and a left circumflex artery. There is ?mild calcified plaque in the proximal LM with associated stenosis of ?25-49%. ?  ?Left anterior descending artery: The LAD is patent without evidence ?of plaque or stenosis. The LAD gives off 1 large branching diagonal. ?There is mild calcified plaque in the proximal LAD with associated ?stenosis of 25-49%. There is mild soft plaque in the mid LAD with ?associated stenosis of 25-49%. ?  ?Left circumflex artery: The LCX is non-dominant. The LCX gives off 2 ?patent obtuse marginal branches. There is mild calcified plaque in ?the proximal LCx with associated stenosis of 25-49%. ?  ?Right coronary artery:  The RCA is dominant with normal take off from ?the right coronary cusp. There is no evidence of plaque or stenosis. ?The RCA terminates as a PDA and right posterolateral branch without ?evidence of plaque or stenosis. ?  ?Right Atrium: Right atrial size is within normal limits. ?  ?Right Ventricle: The right ventricular cavity is within normal ?limits. ?  ?Left Atrium: Left atrial size is normal in size with no left atrial ?appendage filling defect. ?  ?Left Ventricle: The ventricular cavity size is within normal limits. ?There are no stigmata of prior infarction. There is no abnormal ?filling defect. ?  ?Pulmonary arteries: Normal in size without proximal filling defect. ?  ?Pulmonary veins: Normal pulmonary venous  drainage. ?  ?Pericardium: Normal thickness with no significant effusion or ?calcium present. ?  ?Cardiac valves: The aortic valve is trileaflet without significant ?calcification. The mitral valve is normal structure without ?significant calcification. ?  ?Aorta: Normal caliber with no significant disease. ?  ?Extra-cardiac findings: See attached radiology report for ?non-cardiac structures. ?  ?IMPRESSION: ?1. Coronary calcium score of 149. This was 38th percentile for age-, ?sex, and race-matched controls. ?  ?2.  Normal coronary origin with right dominance. ?  ?3.  Mild atherosclerosis.  CAD RADS 2. ?  ?4.  Recommend preventive therapy and risk factor modification. ?  ?5.  Consider non atherosclerotic causes of chest pain. ?  ? ?EKG:  EKG is ordered today.  The ekg ordered today demonstrates marked sinus bradycardia ? ?Recent Labs: ?08/12/2021: BUN 23; Creatinine, Ser 1.11; Potassium 4.4; Sodium 141  ?Recent Lipid Panel ?   ?Component Value Date/Time  ? CHOL 124 05/15/2013 0926  ? TRIG 77.0 05/15/2013 0926  ? HDL 34.70 (L) 05/15/2013 0926  ? CHOLHDL 4 05/15/2013 0926  ? VLDL 15.4 05/15/2013 0926  ? [place] 74 05/15/2013 0926  ? ? ? ?Risk Assessment/Calculations:   ?  ? ?    ? ?Physical Exam:   ? ?VS:   BP 110/70   Pulse (!) 45   Ht '5\' 6"'$  (1.676 m)   Wt 167 lb (75.8 kg)   SpO2 95%   BMI 26.95 kg/m?    ? ?Wt Readings from Last 3 Encounters:  ?10/25/21 167 lb (75.8 kg)  ?10/17/21 167 lb (75.8 kg)  ?06/15/21 169

## 2021-10-17 NOTE — Patient Instructions (Signed)
Medication Instructions:  ?START taking Crestor daily ?*If you need a refill on your cardiac medications before your next appointment, please call your pharmacy* ? ? ?Lab Work: ?Lipids, LFT's  in 3 months ?If you have labs (blood work) drawn today and your tests are completely normal, you will receive your results only by: ?MyChart Message (if you have MyChart) OR ?A paper copy in the mail ?If you have any lab test that is abnormal or we need to change your treatment, we will call you to review the results. ? ? ?Testing/Procedures: ?14 day Zio monitor ?Your physician has recommended that you wear an event monitor. Event monitors are medical devices that record the heart?s electrical activity. Doctors most often [place] these monitors to diagnose arrhythmias. Arrhythmias are problems with the speed or rhythm of the heartbeat. The monitor is a small, portable device. You can wear one while you do your normal daily activities. This is usually used to diagnose what is causing palpitations/syncope (passing out). ? ? ? ?Follow-Up: ?At [facility], you and your health needs are our priority.  As part of our continuing mission to provide you with exceptional heart care, we have created designated Provider Care Teams.  These Care Teams include your primary Cardiologist (physician) and Advanced Practice Providers (APPs -  Physician Assistants and Nurse Practitioners) who all work together to provide you with the care you need, when you need it. ? ?Your next appointment:   ?6 month(s) ? ?The format for your next appointment:   ?In Person ? ?Provider:   ?Sherren Mocha, MD   ? ? ?Other Instructions ?ZIO XT- Long Term Monitor Instructions ? ?Your physician has requested you wear a ZIO patch monitor for 14 days.  ?This is a single patch monitor. [employer] supplies one patch monitor per enrollment. Additional ?stickers are not available. Please do not apply patch if you will be having a Nuclear Stress Test,  ?Echocardiogram, Cardiac  CT, MRI, or Chest Xray during the period you would be wearing the  ?monitor. The patch cannot be worn during these tests. You cannot remove and re-apply the  ?ZIO XT patch monitor.  ?Your ZIO patch monitor will be mailed 3 day USPS to your address on file. It may take 3-5 days  ?to receive your monitor after you have been enrolled.  ?Once you have received your monitor, please review the enclosed instructions. Your monitor  ?has already been registered assigning a specific monitor serial # to you. ? ?Billing and Patient Assistance Program Information ? ?We have supplied [employer] with any of your insurance information on file for billing purposes. ?[employer] offers a sliding scale Patient Assistance Program for patients that do not have  ?insurance, or whose insurance does not completely cover the cost of the ZIO monitor.  ?You must apply for the Patient Assistance Program to qualify for this discounted rate.  ?To apply, please call [employer] at [phone], select option 4, select option 2, ask to apply for  ?Patient Assistance Program. Theodore Demark will ask your household income, and how many people  ?are in your household. They will quote your out-of-pocket cost based on that information.  ?[employer] will also be able to set up a 90-month interest-free payment plan if needed. ? ?Applying the monitor ?  ?Shave hair from upper left chest.  ?Hold abrader disc by orange tab. Rub abrader in 40 strokes over the upper left chest as  ?indicated in your monitor instructions.  ?Clean area with 4 enclosed alcohol pads. Let dry.  ?  Apply patch as indicated in monitor instructions. Patch will be placed under collarbone on left  ?side of chest with arrow pointing upward.  ?Rub patch adhesive wings for 2 minutes. Remove white label marked "1". Remove the white  ?label marked "2". Rub patch adhesive wings for 2 additional minutes.  ?While looking in a mirror, press and release button in center of patch. A small green light will  ?flash 3-4  times. This will be your only indicator that the monitor has been turned on.  ?Do not shower for the first 24 hours. You may shower after the first 24 hours.  ?Press the button if you feel a symptom. You will hear a small click. Record Date, Time and  ?Symptom in the Patient Logbook.  ?When you are ready to remove the patch, follow instructions on the last 2 pages of Patient  ?Logbook. Stick patch monitor onto the last page of Patient Logbook.  ?Place Patient Logbook in the blue and white box. Use locking tab on box and tape box closed  ?securely. The blue and white box has prepaid postage on it. Please place it in the mailbox as  ?soon as possible. Your physician should have your test results approximately 7 days after the  ?monitor has been mailed back to [facility].  ?Call [facility] at [phone] if you have questions regarding  ?your ZIO XT patch monitor. Call them immediately if you see an orange light blinking on your  ?monitor.  ?If your monitor falls off in less than 4 days, contact our Monitor department at [phone].  ?If your monitor becomes loose or falls off after 4 days call [employer] at [phone] for  ?suggestions on securing your monitor ? ?  ?Important Information About [place] ? ? ? ? ?  ?

## 2021-10-22 DIAGNOSIS — E782 Mixed hyperlipidemia: Secondary | ICD-10-CM | POA: Diagnosis not present

## 2021-10-22 DIAGNOSIS — I25119 Atherosclerotic heart disease of native coronary artery with unspecified angina pectoris: Secondary | ICD-10-CM | POA: Diagnosis not present

## 2021-10-22 DIAGNOSIS — R002 Palpitations: Secondary | ICD-10-CM

## 2021-10-22 DIAGNOSIS — R001 Bradycardia, unspecified: Secondary | ICD-10-CM

## 2021-10-24 ENCOUNTER — Ambulatory Visit: Payer: Medicare Other | Admitting: Cardiovascular Disease

## 2021-10-25 ENCOUNTER — Ambulatory Visit: Payer: Medicare Other | Admitting: Sports Medicine

## 2021-10-25 ENCOUNTER — Encounter: Payer: Self-pay | Admitting: Sports Medicine

## 2021-10-25 DIAGNOSIS — M5136 Other intervertebral disc degeneration, lumbar region: Secondary | ICD-10-CM | POA: Diagnosis not present

## 2021-10-25 NOTE — Assessment & Plan Note (Addendum)
He does not have weakness which is encouraging ?However, I believe the recurrent severe cramping is a residual of his deg. Discs, lumbar stenosis and foraminal stenosis ? ?Cont. Conservative care with stretches and sleep in sweat pants ?We will continue activity ?Avoid roation and lateral bending ? ?I want to get him into a PT/ Pilates routine and see if that can lesesn the cramps ? ?Start therapy but see me in 8 weeks or so unless steady improvement ? ?

## 2021-10-25 NOTE — Progress Notes (Signed)
CC: Bilateral lower leg cramps ? ?Patient has a history of leg cramping over several years at night ?He is a retired Materials engineer ?Stays very active but sometimes too much activity will make cramps worse at night ? ?Starting in December these worsened considerably ?Cramps in quads, hamstrings, adductors, peroneals, foot and arch, calf ?He was concerned with systemic disease and has had full medical workup ? ?Has been evaluated by 2 neurologists ?They have ruled out common and uncommon causes  ?NCV, medical testing all unrevealing ? ?However, MRI shows multilevel lumbar degenerative disease ?He has bulging and some herniated disc material both at anterior and posteior vertebral spines ?Foramina looks very stenotic ?At multiple levels the spinal cord and cauda equina are in contact with bony area of spine ? ?Fortunately he is much improved ?Gabapentin did not help and made him fuzzy at low doses ?Sleeping in sweat pants ?No covers pressing on legs ?Waking up every 2 to 3 hours to do some easy stretches ?If he gets up and walks and stretches the cramps are now going away ?6 mos ago they could last for 15 to 30 mins. ? ?Past Hx ?Herniated Cervical disc C5/6 - remote and has done well ?Now has occasional upper extremity sxs but mostly below waist ? ?ROS ?No real weakness ?No numbness ?Bowel and bladder function is good ? ?PE ?Pleaant older M in NAD ?BP (!) 142/72   Ht '5\' 6"'$  (1.676 m)   Wt 167 lb (75.8 kg)   BMI 26.95 kg/m?  ? ?Functional testing ?Good toe, heel and cross over walk ?Balance on 1 foot is good ?Step up and walk without limp ? ?Strength testing ?Excellent strength hip abductors, flexors, adductors ?Hamstring strength normal but gives cramping sensation ?Quad. Strength is normal ? ?Foot strength -  ?4 was testing normal ?Great toe extension is strong ? ?Lumbar motion ?Good extension and flexion ?Rotation is limited ?Rotation RT causes mild crmaping ?Lateral bend is limited with RT more than  left ? ?MRI ?Reviewed with significant findings as noted above ?

## 2021-10-26 ENCOUNTER — Encounter: Payer: Self-pay | Admitting: Cardiovascular Disease

## 2021-10-27 DIAGNOSIS — N138 Other obstructive and reflux uropathy: Secondary | ICD-10-CM | POA: Diagnosis not present

## 2021-10-27 DIAGNOSIS — G8929 Other chronic pain: Secondary | ICD-10-CM | POA: Diagnosis not present

## 2021-11-03 DIAGNOSIS — M25461 Effusion, right knee: Secondary | ICD-10-CM | POA: Diagnosis not present

## 2021-11-03 DIAGNOSIS — S83281D Other tear of lateral meniscus, current injury, right knee, subsequent encounter: Secondary | ICD-10-CM | POA: Diagnosis not present

## 2021-11-03 DIAGNOSIS — M25561 Pain in right knee: Secondary | ICD-10-CM | POA: Diagnosis not present

## 2021-11-04 ENCOUNTER — Ambulatory Visit: Payer: Medicare Other | Admitting: Cardiovascular Disease

## 2021-11-11 DIAGNOSIS — R001 Bradycardia, unspecified: Secondary | ICD-10-CM | POA: Diagnosis not present

## 2021-11-11 DIAGNOSIS — I25119 Atherosclerotic heart disease of native coronary artery with unspecified angina pectoris: Secondary | ICD-10-CM | POA: Diagnosis not present

## 2021-11-11 DIAGNOSIS — E782 Mixed hyperlipidemia: Secondary | ICD-10-CM | POA: Diagnosis not present

## 2021-11-11 DIAGNOSIS — R002 Palpitations: Secondary | ICD-10-CM | POA: Diagnosis not present

## 2022-01-05 DIAGNOSIS — D485 Neoplasm of uncertain behavior of skin: Secondary | ICD-10-CM | POA: Diagnosis not present

## 2022-01-05 DIAGNOSIS — L720 Epidermal cyst: Secondary | ICD-10-CM | POA: Diagnosis not present

## 2022-01-09 ENCOUNTER — Other Ambulatory Visit: Payer: Self-pay | Admitting: *Deleted

## 2022-01-09 DIAGNOSIS — M79642 Pain in left hand: Secondary | ICD-10-CM | POA: Diagnosis not present

## 2022-01-09 DIAGNOSIS — I8393 Asymptomatic varicose veins of bilateral lower extremities: Secondary | ICD-10-CM

## 2022-01-09 DIAGNOSIS — M79641 Pain in right hand: Secondary | ICD-10-CM | POA: Diagnosis not present

## 2022-01-12 ENCOUNTER — Telehealth: Payer: Self-pay | Admitting: Cardiovascular Disease

## 2022-01-12 DIAGNOSIS — Z79899 Other long term (current) drug therapy: Secondary | ICD-10-CM

## 2022-01-12 DIAGNOSIS — R001 Bradycardia, unspecified: Secondary | ICD-10-CM

## 2022-01-12 DIAGNOSIS — E782 Mixed hyperlipidemia: Secondary | ICD-10-CM

## 2022-01-12 DIAGNOSIS — I25119 Atherosclerotic heart disease of native coronary artery with unspecified angina pectoris: Secondary | ICD-10-CM

## 2022-01-12 NOTE — Telephone Encounter (Signed)
Pt aware that Dr. Burt Knack agreed to add his Vitamin B12 level and Mg level to be checked at his lab appt with our office tomorrow. Pt verbalized understanding and was gracious for all the assistance provided.

## 2022-01-12 NOTE — Telephone Encounter (Signed)
Patient called requesting to have additional tests added to his lab order - Magnesium level and Vitamin B12 level.  Patient is scheduled to draw labs tomorrow. Patient requested a callback to confirm he can have these tests added to his panel.

## 2022-01-12 NOTE — Telephone Encounter (Signed)
Yes fine to add those labs thank you

## 2022-01-12 NOTE — Telephone Encounter (Signed)
Dr. Burt Knack, you ordered 3-4 month lipid and liver on this pt at his last OV in May.  He will be having these drawn  tomorrow.  He is asking if you would agree to add on a magnesium level and Vit B 12 level to be checked at tomorrow's lab appt.   Vit B 12 was done back in May from PCP, and came back normal.  Please advise if ok to add on requested labs and triage will order and schedule accordingly thereafter.  Thanks so much!

## 2022-01-16 ENCOUNTER — Other Ambulatory Visit: Payer: Medicare Other

## 2022-01-16 DIAGNOSIS — Z79899 Other long term (current) drug therapy: Secondary | ICD-10-CM

## 2022-01-16 DIAGNOSIS — R001 Bradycardia, unspecified: Secondary | ICD-10-CM | POA: Diagnosis not present

## 2022-01-16 DIAGNOSIS — R002 Palpitations: Secondary | ICD-10-CM | POA: Diagnosis not present

## 2022-01-16 DIAGNOSIS — I25119 Atherosclerotic heart disease of native coronary artery with unspecified angina pectoris: Secondary | ICD-10-CM

## 2022-01-16 DIAGNOSIS — E782 Mixed hyperlipidemia: Secondary | ICD-10-CM

## 2022-01-17 ENCOUNTER — Ambulatory Visit (HOSPITAL_COMMUNITY)
Admission: RE | Admit: 2022-01-17 | Discharge: 2022-01-17 | Disposition: A | Discharge status: Home / Self Care | Payer: Medicare Other | Source: Ambulatory Visit | Attending: Vascular Surgery | Admitting: Vascular Surgery

## 2022-01-17 DIAGNOSIS — I8393 Asymptomatic varicose veins of bilateral lower extremities: Secondary | ICD-10-CM | POA: Insufficient documentation

## 2022-01-17 LAB — HEPATIC FUNCTION PANEL
ALT: 14 IU/L (ref 0–44)
AST: 17 IU/L (ref 0–40)
Albumin: 4.4 g/dL (ref 3.8–4.8)
Alkaline Phosphatase: 64 IU/L (ref 44–121)
Bilirubin Total: 0.5 mg/dL (ref 0.0–1.2)
Bilirubin, Direct: 0.14 mg/dL (ref 0.00–0.40)
Total Protein: 6.2 g/dL (ref 6.0–8.5)

## 2022-01-17 LAB — LIPID PANEL
Chol/HDL Ratio: 3.5 ratio (ref 0.0–5.0)
Cholesterol, Total: 134 mg/dL (ref 100–199)
HDL: 38 mg/dL — ABNORMAL LOW (ref >39)
LDL Chol Calc (NIH): 80 mg/dL (ref 0–99)
Triglycerides: 83 mg/dL (ref 0–149)
VLDL Cholesterol Cal: 16 mg/dL (ref 5–40)

## 2022-01-17 LAB — VITAMIN B12: Vitamin B-12: 1792 pg/mL — ABNORMAL HIGH (ref 232–1245)

## 2022-01-17 LAB — MAGNESIUM: Magnesium: 2.3 mg/dL (ref 1.6–2.3)

## 2022-01-18 ENCOUNTER — Ambulatory Visit: Payer: Medicare Other | Admitting: Vascular Surgery

## 2022-01-18 ENCOUNTER — Encounter: Payer: Self-pay | Admitting: Vascular Surgery

## 2022-01-18 VITALS — BP 131/57 | HR 42 | Temp 98.2°F | Resp 16 | Ht 66.0 in | Wt 167.0 lb

## 2022-01-18 DIAGNOSIS — I872 Venous insufficiency (chronic) (peripheral): Secondary | ICD-10-CM | POA: Diagnosis not present

## 2022-01-18 DIAGNOSIS — I83813 Varicose veins of bilateral lower extremities with pain: Secondary | ICD-10-CM

## 2022-01-18 NOTE — Progress Notes (Signed)
ASSESSMENT & PLAN   CHRONIC VENOUS INSUFFICIENCY: This patient has CEAP C2 venous disease (varicose veins).  He does not have any significant symptoms from his varicosities.  He did have formal venous reflux testing on the right and had some deep venous reflux with minimal superficial venous reflux.  We have discussed the importance of daily leg elevation and the proper positioning for this.  I have encouraged him to consider knee-high compression stockings with a gradient of 15 to 20 mmHg especially when he is traveling.  I encouraged him to continue to remain active as he is.  We discussed potentially swimming or water aerobics which is very healthy for venous disease.  Fortunately has a healthy weight.  I also encouraged him to avoid prolonged sitting and standing.  If his symptoms progress then certainly we can reevaluate.  However, currently he is not a candidate for laser ablation.  REASON FOR CONSULT:    Painful varicose veins.  The consult is requested by Dr. Lajean Manes.  HPI:   Nathaniel [name] is a 76 y.o. male who was referred with varicose veins of both lower extremities.  I have reviewed the records from the referring office.  The patient was seen on 01/03/2022.  The patient was noted to have varicose veins and was having pain associated with these.  On my history the patient does have a long history of varicose veins which have gradually become more prominent.  His main complaint is some atypical cramps which she has in both legs which have been quite bothersome.  He has had an extensive workup for this and no clear etiology has been found.  He has to get up every couple hours at night to stretch to try to prevent these cramps.  He is very active and I do not really get any history of significant aching pain or heaviness in his legs associated with prolonged sitting or standing.  He has mild swelling at times.  He did have a provoked DVT in the left popliteal vein back in 2012  after a 12-hour flight.  He also had a PE at that time.  He was on Coumadin for 6 months.  Past Medical History:  Diagnosis Date   Allergic rhinitis    Anemia    pt unaware   Anginal pain ([place])    Microvascular angina- followed by Dr Burt Knack   Arthritis    back   Cancer [facility])    Basal cell -    Coronary artery disease    nonobstructive   DVT (deep venous thrombosis) ([place]) 03/2011   after prolonged travel   GERD (gastroesophageal reflux disease)    Headache    Vision Migraines   Hiatal hernia    small   History of rheumatic fever    questionable   Hyperlipidemia    Increased prostate specific antigen (PSA) velocity    Laryngopharyngeal reflux    Mitral valve prolapse    Palpitations    PE (pulmonary embolism) 03/2011   after prolonged travel   PONV (postoperative nausea and vomiting)    Fentyl- N/V   Pruritus    resolved   Shortness of breath dyspnea    with Exertion   Sinus bradycardia     Family History  Problem Relation Age of Onset   Migraines Mother    Cancer Other        mother (BrCa and glioblastoma) and brother (GE junction)   CAD Other 22  father died suddenly in the setting of anginal symptoms    SOCIAL HISTORY: Social History   Tobacco Use   Smoking status: Never   Smokeless tobacco: Never  Substance Use Topics   Alcohol use: Yes    Comment: occasional- special ocassional    Allergies  Allergen Reactions   Penicillins Anaphylaxis, Other (See Comments) and Hives    Hands swell, throat swells Hands swell, throat swells No reaction listed Hives (pt clarified at office visit)   Fentanyl Nausea And Vomiting and Other (See Comments)   Imdur [Isosorbide Dinitrate] Other (See Comments)    Headache    Current Outpatient Medications  Medication Sig Dispense Refill   azelastine (ASTELIN) 0.1 % nasal spray Place into both nostrils as needed.     chlorpheniramine (CHLOR-TRIMETON) 2 MG/5ML syrup Take by mouth 2 (two) times daily.      cholecalciferol (VITAMIN D) 1000 UNITS tablet Take 1,000 Units by mouth every morning.      ciclopirox (LOPROX) 0.77 % cream as needed.     Cyanocobalamin (VITAMIN B-12) 2500 MCG SUBL Place 1 tablet under the tongue 3 (three) times a week. Takes on Wednesday     desoximetasone (TOPICORT) 0.25 % cream as needed.     esomeprazole (NEXIUM) 40 MG capsule Take 40 mg by mouth at bedtime.     mupirocin cream (BACTROBAN) 2 % as needed.     Omeprazole-Sodium Bicarbonate (ZEGERID PO) Take 20 mg by mouth daily.      Omeprazole-Sodium Bicarbonate (ZEGERID PO) Take 20 mg by mouth.     rosuvastatin (CRESTOR) 10 MG tablet Take 1 tablet (10 mg total) by mouth daily. 90 tablet 3   senna-docusate (SENOKOT-S) 8.6-50 MG tablet 1 tablet 2 (two) times daily.     sildenafil (REVATIO) 20 MG tablet Take by mouth as needed.     tamsulosin (FLOMAX) 0.4 MG CAPS capsule Take 0.4 mg by mouth at bedtime.      triamcinolone cream (KENALOG) 0.1 % SMARTSIG:1 Sparingly Topical Twice Daily     tiZANidine (ZANAFLEX) 4 MG tablet Take 1 tablet (4 mg total) by mouth every 6 (six) hours as needed for muscle spasms. (Patient not taking: Reported on 10/17/2021) 120 tablet 2   No current facility-administered medications for this visit.    REVIEW OF SYSTEMS:  '[X]'  denotes positive finding, '[ ]'  denotes negative finding Cardiac  Comments:  Chest pain or chest pressure: x   Shortness of breath upon exertion:    Short of breath when lying flat:    Irregular heart rhythm: x       Vascular    Pain in calf, thigh, or hip brought on by ambulation:    Pain in feet at night that wakes you up from your sleep:     Blood clot in your veins:    Leg swelling:         Pulmonary    Oxygen at home:    Productive cough:     Wheezing:         Neurologic    Sudden weakness in arms or legs:     Sudden numbness in arms or legs:     Sudden onset of difficulty speaking or slurred speech:    Temporary loss of vision in one eye:     Problems with  dizziness:         Gastrointestinal    Blood in stool:     Vomited blood:         Genitourinary  Burning when urinating:     Blood in urine:        Psychiatric    Major depression:         Hematologic    Bleeding problems:    Problems with blood clotting too easily:        Skin    Rashes or ulcers:        Constitutional    Fever or chills:    -  PHYSICAL EXAM:   Vitals:   01/18/22 1615  BP: (!) 131/57  Pulse: (!) 42  Resp: 16  Temp: 98.2 F (36.8 C)  TempSrc: Temporal  SpO2: 100%  Weight: 167 lb (75.8 kg)  Height: '5\' 6"'  (1.676 m)   Body mass index is 26.95 kg/m. GENERAL: The patient is a well-nourished male, in no acute distress. The vital signs are documented above. CARDIAC: There is a regular rate and rhythm.  VASCULAR: I do not detect carotid bruits. He has palpable femoral, popliteal, posterior tibial, and dorsalis pedis pulses bilaterally. He has some small varicose veins bilaterally and some reticular veins and telangiectasias bilaterally.         I did look at both great saphenous veins myself with the SonoSite and on the right side he has some short segment of reflux but the vein is not significantly dilated.  The saphenous vein in the thigh on the left was was slightly larger but he just has not had formal venous reflux testing on that side. PULMONARY: There is good air exchange bilaterally without wheezing or rales. ABDOMEN: Soft and non-tender with normal pitched bowel sounds.  MUSCULOSKELETAL: There are no major deformities. NEUROLOGIC: No focal weakness or paresthesias are detected. SKIN: There are no ulcers or rashes noted. PSYCHIATRIC: The patient has a normal affect.  DATA:    VENOUS DUPLEX: I have independently interpreted his venous duplex scan that was done on 01/17/2022.  This was of the right lower extremity only.  There was no evidence of DVT.  There was deep venous reflux in the common femoral vein.  There were some short  segments of reflux in the right great saphenous vein in the proximal and mid thigh and at the knee but the vein was not dilated.  In the thigh the maximum diameter was 2.7 mm.     Deitra Mayo [facility]

## 2022-02-26 ENCOUNTER — Inpatient Hospital Stay (HOSPITAL_COMMUNITY)
Admission: EM | Admit: 2022-02-26 | Discharge: 2022-02-28 | DRG: 251 | Disposition: A | Discharge status: Home / Self Care | Payer: Medicare Other | Attending: Cardiovascular Disease | Admitting: Cardiovascular Disease

## 2022-02-26 ENCOUNTER — Other Ambulatory Visit: Payer: Self-pay

## 2022-02-26 ENCOUNTER — Emergency Department (HOSPITAL_COMMUNITY): Payer: Medicare Other

## 2022-02-26 ENCOUNTER — Encounter (HOSPITAL_COMMUNITY): Payer: Self-pay | Admitting: Emergency Medicine

## 2022-02-26 DIAGNOSIS — Z880 Allergy status to penicillin: Secondary | ICD-10-CM | POA: Diagnosis not present

## 2022-02-26 DIAGNOSIS — Q245 Malformation of coronary vessels: Secondary | ICD-10-CM | POA: Diagnosis not present

## 2022-02-26 DIAGNOSIS — Z885 Allergy status to narcotic agent status: Secondary | ICD-10-CM

## 2022-02-26 DIAGNOSIS — Z86711 Personal history of pulmonary embolism: Secondary | ICD-10-CM

## 2022-02-26 DIAGNOSIS — N401 Enlarged prostate with lower urinary tract symptoms: Secondary | ICD-10-CM | POA: Diagnosis present

## 2022-02-26 DIAGNOSIS — I214 Non-ST elevation (NSTEMI) myocardial infarction: Secondary | ICD-10-CM | POA: Diagnosis not present

## 2022-02-26 DIAGNOSIS — I083 Combined rheumatic disorders of mitral, aortic and tricuspid valves: Secondary | ICD-10-CM | POA: Diagnosis present

## 2022-02-26 DIAGNOSIS — I493 Ventricular premature depolarization: Secondary | ICD-10-CM | POA: Diagnosis not present

## 2022-02-26 DIAGNOSIS — I440 Atrioventricular block, first degree: Secondary | ICD-10-CM | POA: Diagnosis not present

## 2022-02-26 DIAGNOSIS — Z86718 Personal history of other venous thrombosis and embolism: Secondary | ICD-10-CM | POA: Diagnosis not present

## 2022-02-26 DIAGNOSIS — Z79899 Other long term (current) drug therapy: Secondary | ICD-10-CM

## 2022-02-26 DIAGNOSIS — Z8249 Family history of ischemic heart disease and other diseases of the circulatory system: Secondary | ICD-10-CM

## 2022-02-26 DIAGNOSIS — K219 Gastro-esophageal reflux disease without esophagitis: Secondary | ICD-10-CM | POA: Diagnosis not present

## 2022-02-26 DIAGNOSIS — R350 Frequency of micturition: Secondary | ICD-10-CM | POA: Diagnosis not present

## 2022-02-26 DIAGNOSIS — I2510 Atherosclerotic heart disease of native coronary artery without angina pectoris: Secondary | ICD-10-CM | POA: Diagnosis not present

## 2022-02-26 DIAGNOSIS — R030 Elevated blood-pressure reading, without diagnosis of hypertension: Secondary | ICD-10-CM | POA: Diagnosis present

## 2022-02-26 DIAGNOSIS — I77810 Thoracic aortic ectasia: Secondary | ICD-10-CM | POA: Diagnosis not present

## 2022-02-26 DIAGNOSIS — I25110 Atherosclerotic heart disease of native coronary artery with unstable angina pectoris: Secondary | ICD-10-CM | POA: Diagnosis not present

## 2022-02-26 DIAGNOSIS — G43109 Migraine with aura, not intractable, without status migrainosus: Secondary | ICD-10-CM | POA: Diagnosis not present

## 2022-02-26 DIAGNOSIS — Z85828 Personal history of other malignant neoplasm of skin: Secondary | ICD-10-CM

## 2022-02-26 DIAGNOSIS — R079 Chest pain, unspecified: Secondary | ICD-10-CM | POA: Diagnosis not present

## 2022-02-26 DIAGNOSIS — E785 Hyperlipidemia, unspecified: Secondary | ICD-10-CM | POA: Diagnosis not present

## 2022-02-26 LAB — CBC
HCT: 37.6 % — ABNORMAL LOW (ref 39.0–52.0)
Hemoglobin: 12.6 g/dL — ABNORMAL LOW (ref 13.0–17.0)
MCH: 30.2 pg (ref 26.0–34.0)
MCHC: 33.5 g/dL (ref 30.0–36.0)
MCV: 90.2 fL (ref 80.0–100.0)
Platelets: 189 10*3/uL (ref 150–400)
RBC: 4.17 MIL/uL — ABNORMAL LOW (ref 4.22–5.81)
RDW: 13.4 % (ref 11.5–15.5)
WBC: 5 10*3/uL (ref 4.0–10.5)
nRBC: 0 % (ref 0.0–0.2)

## 2022-02-26 LAB — BASIC METABOLIC PANEL
Anion gap: 7 (ref 5–15)
BUN: 18 mg/dL (ref 8–23)
CO2: 25 mmol/L (ref 22–32)
Calcium: 9.1 mg/dL (ref 8.9–10.3)
Chloride: 107 mmol/L (ref 98–111)
Creatinine, Ser: 1.28 mg/dL — ABNORMAL HIGH (ref 0.61–1.24)
GFR, Estimated: 58 mL/min — ABNORMAL LOW (ref >60)
Glucose, Bld: 107 mg/dL — ABNORMAL HIGH (ref 70–99)
Potassium: 3.7 mmol/L (ref 3.5–5.1)
Sodium: 139 mmol/L (ref 135–145)

## 2022-02-26 LAB — HEPATIC FUNCTION PANEL
ALT: 18 U/L (ref 0–44)
AST: 26 U/L (ref 15–41)
Albumin: 4.1 g/dL (ref 3.5–5.0)
Alkaline Phosphatase: 45 U/L (ref 38–126)
Bilirubin, Direct: 0.1 mg/dL (ref 0.0–0.2)
Indirect Bilirubin: 0.7 mg/dL (ref 0.3–0.9)
Total Bilirubin: 0.8 mg/dL (ref 0.3–1.2)
Total Protein: 6.3 g/dL — ABNORMAL LOW (ref 6.5–8.1)

## 2022-02-26 LAB — TROPONIN I (HIGH SENSITIVITY)
Troponin I (High Sensitivity): 1632 ng/L (ref <18)
Troponin I (High Sensitivity): 3332 ng/L (ref <18)

## 2022-02-26 LAB — TSH: TSH: 1.481 u[IU]/mL (ref 0.350–4.500)

## 2022-02-26 MED ORDER — ASPIRIN 81 MG PO TBEC
81.0000 mg | DELAYED_RELEASE_TABLET | Freq: Every day | ORAL | Status: DC
  Administered 2022-02-27 – 2022-02-28 (×2): 81 mg via ORAL
  Filled 2022-02-26 (×2): qty 1

## 2022-02-26 MED ORDER — ASPIRIN 325 MG PO TABS
325.0000 mg | ORAL_TABLET | Freq: Once | ORAL | Status: AC
  Administered 2022-02-26: 325 mg via ORAL
  Filled 2022-02-26: qty 1

## 2022-02-26 MED ORDER — TAMSULOSIN HCL 0.4 MG PO CAPS: 0.4000 mg | ORAL_CAPSULE | Freq: Every day | ORAL | Status: DC

## 2022-02-26 MED ORDER — AMLODIPINE BESYLATE 5 MG PO TABS
2.5000 mg | ORAL_TABLET | Freq: Every day | ORAL | Status: DC
  Administered 2022-02-27: 2.5 mg via ORAL
  Filled 2022-02-26: qty 1

## 2022-02-26 MED ORDER — HEPARIN (PORCINE) 25000 UT/250ML-% IV SOLN
1000.0000 [IU]/h | INTRAVENOUS | Status: DC
  Administered 2022-02-26: 900 [IU]/h via INTRAVENOUS
  Filled 2022-02-26 (×2): qty 250

## 2022-02-26 MED ORDER — SODIUM CHLORIDE 0.9 % IV SOLN: 250.0000 mL | INTRAVENOUS | Status: DC | PRN

## 2022-02-26 MED ORDER — ROSUVASTATIN CALCIUM 20 MG PO TABS: 20.0000 mg | ORAL_TABLET | Freq: Every day | ORAL | Status: DC

## 2022-02-26 MED ORDER — SODIUM CHLORIDE 0.9% FLUSH
3.0000 mL | Freq: Two times a day (BID) | INTRAVENOUS | Status: DC
  Administered 2022-02-27: 3 mL via INTRAVENOUS

## 2022-02-26 MED ORDER — SODIUM CHLORIDE 0.9 % WEIGHT BASED INFUSION
3.0000 mL/kg/h | INTRAVENOUS | Status: AC
  Administered 2022-02-27: 3 mL/kg/h via INTRAVENOUS

## 2022-02-26 MED ORDER — ACETAMINOPHEN 325 MG PO TABS: 650.0000 mg | ORAL_TABLET | ORAL | Status: DC | PRN

## 2022-02-26 MED ORDER — ONDANSETRON HCL 4 MG/2ML IJ SOLN: 4.0000 mg | Freq: Four times a day (QID) | INTRAMUSCULAR | Status: DC | PRN

## 2022-02-26 MED ORDER — VITAMIN B-12 1000 MCG PO TABS
2500.0000 ug | ORAL_TABLET | ORAL | Status: DC
  Administered 2022-02-27: 2500 ug via ORAL
  Filled 2022-02-26: qty 3

## 2022-02-26 MED ORDER — SODIUM CHLORIDE 0.9% FLUSH: 3.0000 mL | INTRAVENOUS | Status: DC | PRN

## 2022-02-26 MED ORDER — HEPARIN BOLUS VIA INFUSION
4000.0000 [IU] | Freq: Once | INTRAVENOUS | Status: AC
  Administered 2022-02-26: 4000 [IU] via INTRAVENOUS
  Filled 2022-02-26: qty 4000

## 2022-02-26 MED ORDER — NITROGLYCERIN 0.4 MG SL SUBL: 0.4000 mg | SUBLINGUAL_TABLET | SUBLINGUAL | Status: DC | PRN

## 2022-02-26 MED ORDER — VITAMIN B-12 2500 MCG SL SUBL: 1.0000 | SUBLINGUAL_TABLET | SUBLINGUAL | Status: DC

## 2022-02-26 MED ORDER — VITAMIN D 25 MCG (1000 UNIT) PO TABS
1000.0000 [IU] | ORAL_TABLET | Freq: Every morning | ORAL | Status: DC
  Administered 2022-02-27: 1000 [IU] via ORAL
  Filled 2022-02-26: qty 1

## 2022-02-26 MED ORDER — PANTOPRAZOLE SODIUM 40 MG PO TBEC
40.0000 mg | DELAYED_RELEASE_TABLET | Freq: Every day | ORAL | Status: DC
  Filled 2022-02-26: qty 1

## 2022-02-26 MED ORDER — SODIUM CHLORIDE 0.9 % WEIGHT BASED INFUSION
1.0000 mL/kg/h | INTRAVENOUS | Status: DC
  Administered 2022-02-27: 1 mL/kg/h via INTRAVENOUS

## 2022-02-26 NOTE — ED Notes (Signed)
Cardiology at bedside.

## 2022-02-26 NOTE — H&P (Signed)
Cardiology Admission History and Physical   Patient ID: Nathaniel [name] MRN: [id number]; DOB: November 15, 1945   Admission date: 02/26/2022  PCP:  Charlane Ferretti, MD   [facility] Cardiologist:  Sherren Mocha, MD        Chief Complaint: Prolonged angina pectoris at rest  Patient Profile:   Nathaniel [name] is a 76 y.o. male with a history of microvascular angina who is being seen 02/26/2022 for the evaluation of non-STEMI.  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  He had 3 cardiac catheterizations in the early 2000's, including evaluation with intravascular ultrasound, that failed to identify a structural cause for his symptoms.  Despite this he had very typical symptoms of stable angina pectoris with a predictable threshold of exertion that would bring on symptoms such as walking from the parking lot at a brisk pace or climbing stairs fast.  Over the last few months his exertional angina had actually abated, until mid August when he began experiencing very similar complaints at rest.  He describes left sided chest discomfort in the precordial area, a sensation of deep pressure or expansion, sometimes radiating to his left shoulder and left arm.  The symptoms would resolve spontaneously.  He avoids taking nitroglycerin since this causes severe headaches.  Today he had a prolonged episode of similar chest discomfort that occurred while he was standing in [place] for the [employer].  Symptoms waxed and waned for at least 4 hours.  He did not have any significant shortness of breath, dizziness, palpitations presyncope or syncope.  He denies exertional dyspnea, orthopnea or PND.  He has not had any permanent focal neurological events, but had 2 ocular migraines just in the last couple of days.  He is currently asymptomatic.   He is receiving intravenous heparin.  He has chronic sinus bradycardia but has never experienced syncope or met criteria for pacemaker implantation.  Extensive past work-up with echo, stress echo cardiopulmonary exercise tests and coronary CT angiography have generally shown normal or low risk findings.  Most recently underwent a coronary CT angiogram in March 2023 showing minor obstructive lesions (stenosis 25-49%) in the proximal LAD, mid LAD, proximal left circumflex coronary artery as well as a mild calcified plaque in the proximal left main with a stenosis of 25-49%.  His calcium score is actually less than average (149, 38th percentile).  His calcium score has increased from 94 years earlier, but remains in the 38th percentile for age and gender.  He is known to have a small intramyocardial segment of the proximal LAD artery, also confirmed on previous CT.  Most recent assessment of LV function was echocardiogram from November 2022 with EF 55-60% and mitral inflow consistent with "impaired laxation".  There is mild mitral insufficiency and mild aortic insufficiency.  He does not have left ventricular hypertrophy.  He has had some unusual muscular complaints this year and briefly interrupted treatment with atorvastatin without any improvement in his symptoms.  He is currently taking rosuvastatin 10 mg daily for the last several months.  A very recent lipid profile shows an LDL cholesterol of 80 and HDL of 38.  He does not have diabetes mellitus and is a non-smoker.  Although there is a family history of CAD, this was not early onset.  His blood pressure is uncharacteristically high today, typically at home his blood  pressure was in the 120-140/60-70 range.  Current noncardiac problems include worsening symptoms of BPH with urinary frequency, degenerative lumbar spine disease.   Past Medical History:  Diagnosis Date   Allergic rhinitis    Anemia    pt unaware   Anginal pain ([place])     Microvascular angina- followed by Dr Burt Knack   Arthritis    back   Cancer [facility])    Basal cell -    Coronary artery disease    nonobstructive   DVT (deep venous thrombosis) ([place]) 03/2011   after prolonged travel   GERD (gastroesophageal reflux disease)    Headache    Vision Migraines   Hiatal hernia    small   History of rheumatic fever    questionable   Hyperlipidemia    Increased prostate specific antigen (PSA) velocity    Laryngopharyngeal reflux    Mitral valve prolapse    Palpitations    PE (pulmonary embolism) 03/2011   after prolonged travel   PONV (postoperative nausea and vomiting)    Fentyl- N/V   Pruritus    resolved   Shortness of breath dyspnea    with Exertion   Sinus bradycardia     Past Surgical History:  Procedure Laterality Date   COLONOSCOPY     COLONOSCOPY N/A 02/24/2015   Procedure: COLONOSCOPY;  Surgeon: Ronald Lobo, MD;  Location: [facility] ENDOSCOPY;  Service: Endoscopy;  Laterality: N/A;   CYSTOSCOPY     ESOPHAGOGASTRODUODENOSCOPY N/A 02/06/2013   Procedure: ESOPHAGOGASTRODUODENOSCOPY (EGD);  Surgeon: Cleotis Nipper, MD;  Location: [facility] ENDOSCOPY;  Service: Endoscopy;  Laterality: N/A;   ESOPHAGOGASTRODUODENOSCOPY N/A 02/24/2015   Procedure: ESOPHAGOGASTRODUODENOSCOPY (EGD);  Surgeon: Ronald Lobo, MD;  Location: [facility] ENDOSCOPY;  Service: Endoscopy;  Laterality: N/A;   HUMERUS FRACTURE SURGERY Right 05/2003   Repair of an AC seperation of the left     SHOULDER ARTHROSCOPY Left    A/C tear   TONSILLECTOMY AND ADENOIDECTOMY       Medications Prior to Admission: Prior to Admission medications   Medication Sig Start Date End Date Taking? Authorizing Provider  azelastine (ASTELIN) 0.1 % nasal spray Place into both nostrils as needed. 01/13/20   [provider]  chlorpheniramine (CHLOR-TRIMETON) 2 MG/5ML syrup Take by mouth 2 (two) times daily.    [provider]  cholecalciferol (VITAMIN D) 1000 UNITS tablet Take 1,000 Units by mouth  every morning.     [provider]  ciclopirox (LOPROX) 0.77 % cream as needed. 11/03/15   [provider]  Cyanocobalamin (VITAMIN B-12) 2500 MCG SUBL Place 1 tablet under the tongue 3 (three) times a week. Takes on Wednesday    [provider]  desoximetasone (TOPICORT) 0.25 % cream as needed. 11/03/15   [provider]  esomeprazole (NEXIUM) 40 MG capsule Take 40 mg by mouth at bedtime.    [provider]  mupirocin cream (BACTROBAN) 2 % as needed. 06/22/20   [provider]  Omeprazole-Sodium Bicarbonate (ZEGERID PO) Take 20 mg by mouth daily.     [provider]  Omeprazole-Sodium Bicarbonate (ZEGERID PO) Take 20 mg by mouth.    [provider]  rosuvastatin (CRESTOR) 10 MG tablet Take 1 tablet (10 mg total) by mouth daily. 07/15/21   Sherren Mocha, MD  senna-docusate (SENOKOT-S) 8.6-50 MG tablet 1 tablet 2 (two) times daily.    [provider]  sildenafil (REVATIO) 20 MG tablet Take by mouth as needed. 04/06/20   [provider]  tamsulosin [facility])  0.4 MG CAPS capsule Take 0.4 mg by mouth at bedtime.     [provider]  tiZANidine (ZANAFLEX) 4 MG tablet Take 1 tablet (4 mg total) by mouth every 6 (six) hours as needed for muscle spasms. Patient not taking: Reported on 10/17/2021 06/15/21   Pieter Partridge, DO  triamcinolone cream (KENALOG) 0.1 % SMARTSIG:1 Sparingly Topical Twice Daily 06/29/21   [provider]     Allergies:    Allergies  Allergen Reactions   Penicillins Anaphylaxis, Other (See Comments) and Hives    Hands swell, throat swells Hands swell, throat swells No reaction listed Hives (pt clarified at office visit)   Fentanyl Nausea And Vomiting and Other (See Comments)   Imdur [Isosorbide Dinitrate] Other (See Comments)    Headache    Social History:   Social History   Socioeconomic History   Marital status: Married    Spouse name: Not on file   Number of  children: Not on file   Years of education: Not on file   Highest education level: Not on file  Occupational History   Occupation: retired  Tobacco Use   Smoking status: Never   Smokeless tobacco: Never  Substance and Sexual Activity   Alcohol use: Yes    Comment: occasional- special ocassional   Drug use: No   Sexual activity: Not on file  Other Topics Concern   Not on file  Social History Narrative   Pt lives in [place] with wife and daughter (has autism).  Son is an anesthesia resident in [place].   Retired Materials engineer in 2014.     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   Social Determinants of Health   Financial Resource Strain: Not on file  Food Insecurity: Not on file  Transportation Needs: Not on file  Physical Activity: Not on file  Stress: Not on file  Social Connections: Not on file  Intimate Partner Violence: Not on file    Family History:   The patient's family history includes CAD (age of onset: 40) in an other family member; Cancer in an other family member; Migraines in his mother.    ROS:  Please see the history of present illness.  All other ROS reviewed and negative.     Physical Exam/Data:   Vitals:   02/26/22 1515 02/26/22 1624 02/26/22 1626 02/26/22 1700  BP: (!) 159/62 (!) 182/68  (!) 174/76  Pulse: 100 (!) 50  (!) 48  Resp: '16 13  18  ' Temp: 97.9 F (36.6 C)     TempSrc: Oral     SpO2: 99% 100%  100%  Weight:   74.8 kg   Height:   '5\' 6"'  (1.676 m)    No intake or output data in the 24 hours ending 02/26/22 1734    02/26/2022    4:26 PM 01/18/2022    4:15 PM 10/25/2021    9:34 AM  Last 3 Weights  Weight (lbs) 165 lb 167 lb 167 lb  Weight (kg) 74.844 kg 75.751 kg 75.751 kg     Body mass index is 26.63 kg/m.  General:  Well nourished, well developed, in no acute distress appears thin and fit HEENT: normal Neck: no JVD Vascular: No carotid bruits; Distal pulses 2+ bilaterally   Cardiac:  normal S1, S2; RRR; no murmur  Lungs:  clear to  auscultation bilaterally, no wheezing, rhonchi or rales  Abd: soft, nontender, no hepatomegaly  Ext: no edema Musculoskeletal:  No deformities, BUE and BLE strength  normal and equal.  Scar of previous left shoulder surgery Skin: warm and dry  Neuro:  CNs 2-12 intact, no focal abnormalities noted Psych:  Normal affect    EKG:  The ECG that was done today was personally reviewed and demonstrates Sinus bradycardia with first-degree AV block and a minor pause due to what appears to be a blocked PAC, broad QRS at 122 ms in a nonspecific IVCD pattern with Q waves in leads V1 and V2.  There are no overt ischemic repolarization abnormalities but T waves are slightly inverted in I and aVL.  Normal QTc 415 ms.  The only real change from previous tracings is that the QRS complex is substantially broader today.  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  He underwent catheterization by Dr.  Vicenta Aly in May 2006 at which time he had a myocardial bridging in the  proximal LAD with compression to 40-50% and minimal plaque documented by  IVUS proximal to the area for systolic compression.  He subsequently had  evaluation by Dr. Jenne Pane at [facility] and had an exercise MRI which  showed no evidence of ischemia and specifically, no evidence of ischemia  in the distribution of the LAD.  Based on these findings, he is felt to  have microvascular angina.  Therapy has been limited by a slow heart  rate which has precluded beta blockers and an intolerance of nitrates  due to headache and nonresponsiveness to calcium channel blockers.  Recently his symptoms increased in severity and they occurred with very  minimal exertion.  We made a decision to reevaluate him with  angiography.  We considered CT angiography, but he was concerned about  the radiation so we decided  on conventional angiography to rule out  development of obstructive coronary disease.    PROCEDURE:  The procedure was performed via the right femoral artery and  arterial sheath and 4-French preformed coronary catheters.  A front wall  arterial puncture was performed and Omnipaque contrast was used.  The  patient did have chest discomfort with the contrast injections.  The  nitroglycerin was given intracoronary to further evaluate the LAD  bridging.  The patient tolerated the procedure well and left the  laboratory in satisfactory condition.  He received 1 mg of Versed at the  beginning of the procedure and 2 mg of morphine at the end of the  procedure.    RESULTS:  Hemodynamic data:  The aortic pressure was 116/64 with a mean  of 86.  The left ventricular pressure is 116/13.    Left main coronary main free of significant disease.    Left anterior descending artery gave rise to a large diagonal branch  with two sub-branches and two septal perforators.  There was mild 40%  narrowing in one of the sub branches of the diagonal branch.  There was  30% narrowing in the proximal LAD.  There was systolic bridging in the  proximal LAD just distal to the mild plaque which narrows the lumen to  about 40% during systole.  The distal vessel is free of major  obstruction.    The circumflex artery gave rise to two marginal branches and a large and  small posterolateral branch.  These vessels were free of significant  disease.    The right coronary artery was  a moderately large vessel, gave rise to a  conus branch, two right ventricular branches, a posterior descending  branch, and two posterolateral branches.  This vessel is free of  significant disease but there was TIMI-2 flow in the vessel.  It took 4  cycles for the vessel to completely fill and it appeared that the  myocardial blush did not wash out rapidly.    The left ventriculography was done in the RAO projection and showed good   wall motion with no areas of hypokinesis.  Estimated ejection fraction  was 55%..    CONCLUSION:  Mild nonobstructive coronary artery disease with 30 cm  narrowing in the proximal left anterior descending, 62% systolic  compression in the proximal left anterior descending, and TIMI-2 flow in  the right coronary with normal left ventricular function.    RECOMMENDATIONS:  The patient has minimal nonobstructive disease and I  do not think the systolic bridging is responsible for his symptoms.  I  think Timmothy Sours meets all the criteria for the diagnosis of microvascular  angina syndrome X.  I will discuss further treatment with him.  We may  reconsider with Renexa although he has had some reluctance to try this  in the past.  Will also consider referral back to Dr. Jenne Pane at  [facility].   Coronary CT angiogram March 2023  Left main: The left main is a large caliber vessel with a normal take off from the left coronary cusp that bifurcates to form a left anterior descending artery and a left circumflex artery. There is mild calcified plaque in the proximal LM with associated stenosis of 25-49%.   Left anterior descending artery: The LAD is patent without evidence of plaque or stenosis. The LAD gives off 1 large branching diagonal. There is mild calcified plaque in the proximal LAD with associated stenosis of 25-49%. There is mild soft plaque in the mid LAD with associated stenosis of 25-49%.   Left circumflex artery: The LCX is non-dominant. The LCX gives off 2 patent obtuse marginal branches. There is mild calcified plaque in the proximal LCx with associated stenosis of 25-49%.   Right coronary artery: The RCA is dominant with normal take off from the right coronary cusp. There is no evidence of plaque or stenosis. The RCA terminates as a PDA and right posterolateral branch without evidence of plaque or stenosis.   Right Atrium: Right atrial size is within normal limits.    Right Ventricle: The right ventricular cavity is within normal limits.   Left Atrium: Left atrial size is normal in size with no left atrial appendage filling defect.   Left Ventricle: The ventricular cavity size is within normal limits. There are no stigmata of prior infarction. There is no abnormal filling defect.   Pulmonary arteries: Normal in size without proximal filling defect.   Pulmonary veins: Normal pulmonary venous drainage.   Pericardium: Normal thickness with no significant effusion or calcium present.   Cardiac valves: The aortic valve is trileaflet without significant calcification. The mitral valve is normal structure without significant calcification.   Aorta: Normal caliber with no significant disease.   Extra-cardiac findings: See attached radiology report for non-cardiac structures.   IMPRESSION: 1. Coronary calcium score of 149. This was 38th percentile for age-, sex, and race-matched controls.   2.  Normal coronary origin with right dominance.   3.  Mild atherosclerosis.  CAD RADS 2.   4.  Recommend preventive therapy and risk factor modification.   5.  Consider non atherosclerotic causes of chest pain.  14-day event monitor June 2023 SUMMARY: The basic rhythm is normal sinus with an average HR of 53 bpm No atrial fibrillation or flutter No high-grade heart block There are rare PVC's and rare supraventricular beats, both occurring less than 1% burden The sinus rate is as low as 34 bpm without AV block or junctional beats seen Rare supraventricular runs limited to 9 beats for the longest run   Echocardiogram November 2022  1. Left ventricular ejection fraction, by estimation, is 55 to 60%. The  left ventricle has normal function. The left ventricle has no regional  wall motion abnormalities. Left ventricular diastolic parameters are  consistent with Grade I diastolic  dysfunction (impaired relaxation).   2. Right ventricular systolic  function is normal. The right ventricular  size is normal. There is normal pulmonary artery systolic pressure. The  estimated right ventricular systolic pressure is 39.7 mmHg.   3. Moderate elongation of the mitral anterior and posterior leaflets. No  prolapse per current criteria noted.   4. The mitral valve is abnormal. trivial to mild mitral valve  regurgitation.   5. The aortic valve is tricuspid. Aortic valve regurgitation is mild.   6. The inferior vena cava is normal in size with greater than 50%  respiratory variability, suggesting right atrial pressure of 3 mmHg.   Comparison(s): Changes from prior study are noted. 05/01/2019: LVEF  50-55%, mild AI.    Laboratory Data:  High Sensitivity Troponin:   Recent Labs  Lab 02/26/22 1519  TROPONINIHS 1,632*      Chemistry Recent Labs  Lab 02/26/22 1519  NA 139  K 3.7  CL 107  CO2 25  GLUCOSE 107*  BUN 18  CREATININE 1.28*  CALCIUM 9.1  GFRNONAA 58*  ANIONGAP 7    No results for input(s): "PROT", "ALBUMIN", "AST", "ALT", "ALKPHOS", "BILITOT" in the last 168 hours. Lipids No results for input(s): "CHOL", "TRIG", "HDL", "LABVLDL", "LDLCALC", "CHOLHDL" in the last 168 hours. Hematology Recent Labs  Lab 02/26/22 1519  WBC 5.0  RBC 4.17*  HGB 12.6*  HCT 37.6*  MCV 90.2  MCH 30.2  MCHC 33.5  RDW 13.4  PLT 189   Thyroid No results for input(s): "TSH", "FREET4" in the last 168 hours. BNPNo results for input(s): "BNP", "PROBNP" in the last 168 hours.  DDimer No results for input(s): "DDIMER" in the last 168 hours.   Radiology/Studies:  DG Chest 2 View  Result Date: 02/26/2022 CLINICAL DATA:  Sudden onset of intermittent chest pain since this morning EXAM: CHEST - 2 VIEW COMPARISON:  06/17/2021 FINDINGS: Frontal and lateral views of the chest demonstrate an unremarkable cardiac silhouette. No acute airspace disease, effusion, or pneumothorax. No acute bony abnormalities. IMPRESSION: 1. Stable chest, no acute  process. Electronically Signed   By: Randa Ngo M.D.   On: 02/26/2022 15:37     Assessment and Plan:   NSTEMI: He has had classic symptoms of unstable angina for the last few weeks culminating in a prolonged episode of chest pain at rest today.  Although there are no overt ST segment changes, he does have a broader QRS on ECG today.  High-sensitivity troponin is unequivocally elevated.  Continue aspirin, start intravenous heparin, cannot provide treatment with beta-blockers due to bradycardia, add low-dose calcium channel blocker for potential vasospasm.  Not withstanding the reassuring recent coronary CT angiogram findings, I have recommended that he undergo coronary angiography tomorrow, possible percutaneous revascularization with stent placement. HLP: Target LDL  less than 70 based on the current events.  Increase rosuvastatin to 20 mg daily. HBP: Appears to be situational, but provides some room for vasodilator therapy.  Shared Decision Making/Informed Consent The risks [stroke (1 in 1000), death (1 in 1000), kidney failure [usually temporary] (1 in 500), bleeding (1 in 200), allergic reaction [possibly serious] (1 in 200)], benefits (diagnostic support and management of coronary artery disease) and alternatives of a cardiac catheterization were discussed in detail with Dr.Swoveland and he is willing to proceed.   Risk Assessment/Risk Scores:    TIMI Risk Score for Unstable Angina or Non-ST Elevation MI:   The patient's TIMI risk score is  , which indicates a  % risk of all cause mortality, new or recurrent myocardial infarction or need for urgent revascularization in the next 14 days.       Severity of Illness: The appropriate patient status for this patient is INPATIENT. Inpatient status is judged to be reasonable and necessary in order to provide the required intensity of service to ensure the patient's safety. The patient's presenting symptoms, physical exam findings, and initial  radiographic and laboratory data in the context of their chronic comorbidities is felt to place them at high risk for further clinical deterioration. Furthermore, it is not anticipated that the patient will be medically stable for discharge from the hospital within 2 midnights of admission.   * I certify that at the point of admission it is my clinical judgment that the patient will require inpatient hospital care spanning beyond 2 midnights from the point of admission due to high intensity of service, high risk for further deterioration and high frequency of surveillance required.*   For questions or updates, please contact [place] Please consult [email or web address] for contact info under     Signed, Sanda Klein, MD  02/26/2022 5:34 PM

## 2022-02-26 NOTE — ED Notes (Signed)
Patient now in room from lobby. Placed on cardiac monitor. EDP at bedside.

## 2022-02-26 NOTE — ED Provider Notes (Signed)
[place] EMERGENCY DEPARTMENT Provider Note   CSN: [id number] Arrival date & time: 02/26/22  1503     History  Chief Complaint  Patient presents with   Chest Pain    Nathaniel [name] is a 76 y.o. male history of CAD with no stents, here presenting with chest pain.  Patient states that he had a cath about 10 years ago and was told that he has microvascular disease.  He had a CT coronary early this year and was told that this is consistent with his age.  Patient states that he has intermittent chest pain with exertion usually.  He states that he was at religious services today and was standing and had chest pressure.  He was not exerting himself.  He states that he was pressure sensation lasted about 4 hours.  He came here and now is chest pain-free.  Follows up with Dr. Burt Knack from cardiology and never had a stent in his heart.  The history is provided by the patient.       Home Medications Prior to Admission medications   Medication Sig Start Date End Date Taking? Authorizing Provider  azelastine (ASTELIN) 0.1 % nasal spray Place into both nostrils as needed. 01/13/20   [provider]  chlorpheniramine (CHLOR-TRIMETON) 2 MG/5ML syrup Take by mouth 2 (two) times daily.    [provider]  cholecalciferol (VITAMIN D) 1000 UNITS tablet Take 1,000 Units by mouth every morning.     [provider]  ciclopirox (LOPROX) 0.77 % cream as needed. 11/03/15   [provider]  Cyanocobalamin (VITAMIN B-12) 2500 MCG SUBL Place 1 tablet under the tongue 3 (three) times a week. Takes on Wednesday    [provider]  desoximetasone (TOPICORT) 0.25 % cream as needed. 11/03/15   [provider]  esomeprazole (NEXIUM) 40 MG capsule Take 40 mg by mouth at bedtime.    [provider]  mupirocin cream (BACTROBAN) 2 % as needed. 06/22/20   [provider]  Omeprazole-Sodium Bicarbonate (ZEGERID PO) Take 20 mg by mouth  daily.     [provider]  Omeprazole-Sodium Bicarbonate (ZEGERID PO) Take 20 mg by mouth.    [provider]  rosuvastatin (CRESTOR) 10 MG tablet Take 1 tablet (10 mg total) by mouth daily. 07/15/21   Sherren Mocha, MD  senna-docusate (SENOKOT-S) 8.6-50 MG tablet 1 tablet 2 (two) times daily.    [provider]  sildenafil (REVATIO) 20 MG tablet Take by mouth as needed. 04/06/20   [provider]  tamsulosin (FLOMAX) 0.4 MG CAPS capsule Take 0.4 mg by mouth at bedtime.     [provider]  tiZANidine (ZANAFLEX) 4 MG tablet Take 1 tablet (4 mg total) by mouth every 6 (six) hours as needed for muscle spasms. Patient not taking: Reported on 10/17/2021 06/15/21   Pieter Partridge, DO  triamcinolone cream (KENALOG) 0.1 % SMARTSIG:1 Sparingly Topical Twice Daily 06/29/21   [provider]      Allergies    Penicillins, Fentanyl, and Imdur [isosorbide dinitrate]    Review of Systems   Review of Systems  Cardiovascular:  Positive for chest pain.  All other systems reviewed and are negative.   Physical Exam Updated Vital Signs BP 131/65   Pulse (!) 49   Temp 97.9 F (36.6 C) (Oral)   Resp 15   Ht '5\' 6"'$  (1.676 m)   Wt 74.8 kg   SpO2 100%   BMI 26.63 kg/m  Physical Exam Vitals and nursing note reviewed.  Constitutional:      Comments: Slightly uncomfortable  HENT:     Head: Normocephalic.  Eyes:     Extraocular Movements: Extraocular movements intact.     Pupils: Pupils are equal, round, and reactive to light.  Cardiovascular:     Rate and Rhythm: Normal rate and regular rhythm.     Heart sounds: Normal heart sounds.  Pulmonary:     Effort: Pulmonary effort is normal.     Breath sounds: Normal breath sounds.  Abdominal:     General: Bowel sounds are normal.     Palpations: Abdomen is soft.  Musculoskeletal:        General: Normal range of motion.     Cervical back: Normal range of motion and neck supple.  Skin:    General:  Skin is warm.     Capillary Refill: Capillary refill takes less than 2 seconds.  Neurological:     General: No focal deficit present.     Mental Status: He is oriented to person, place, and time.  Psychiatric:        Mood and Affect: Mood normal.        Behavior: Behavior normal.     ED Results / Procedures / Treatments   Labs (all labs ordered are listed, but only abnormal results are displayed) Labs Reviewed  BASIC METABOLIC PANEL - Abnormal; Notable for the following components:      Result Value   Glucose, Bld 107 (*)    Creatinine, Ser 1.28 (*)    GFR, Estimated 58 (*)    All other components within normal limits  CBC - Abnormal; Notable for the following components:   RBC 4.17 (*)    Hemoglobin 12.6 (*)    HCT 37.6 (*)    All other components within normal limits  TROPONIN I (HIGH SENSITIVITY) - Abnormal; Notable for the following components:   Troponin I (High Sensitivity) 1,632 (*)    All other components within normal limits  TROPONIN I (HIGH SENSITIVITY) - Abnormal; Notable for the following components:   Troponin I (High Sensitivity) 3,332 (*)    All other components within normal limits  HEPARIN LEVEL (UNFRACTIONATED)  CBC    EKG EKG Interpretation  Date/Time:  Sunday February 26 2022 16:24:07 EDT Ventricular Rate:  51 PR Interval:  257 QRS Duration: 125 QT Interval:  459 QTC Calculation: 423 R Axis:   -59 Text Interpretation: Sinus rhythm Prolonged PR interval Nonspecific IVCD with LAD Anteroseptal infarct, old sinus bradycardia, no heart block Confirmed by Wandra Arthurs [phone]) on 02/26/2022 5:11:34 PM  Radiology DG Chest 2 View  Result Date: 02/26/2022 CLINICAL DATA:  Sudden onset of intermittent chest pain since this morning EXAM: CHEST - 2 VIEW COMPARISON:  06/17/2021 FINDINGS: Frontal and lateral views of the chest demonstrate an unremarkable cardiac silhouette. No acute airspace disease, effusion, or pneumothorax. No acute bony abnormalities.  IMPRESSION: 1. Stable chest, no acute process. Electronically Signed   By: Randa Ngo M.D.   On: 02/26/2022 15:37    Procedures Procedures     CRITICAL CARE Performed by: Wandra Arthurs   Total critical care time: 30 minutes  Critical care time was exclusive of separately billable procedures and treating other patients.  Critical care was necessary to treat or prevent imminent or life-threatening deterioration.  Critical care was time spent personally by me on the following activities: development of treatment plan with patient and/or surrogate as well as  nursing, discussions with consultants, evaluation of patient's response to treatment, examination of patient, obtaining history from patient or surrogate, ordering and performing treatments and interventions, ordering and review of laboratory studies, ordering and review of radiographic studies, pulse oximetry and re-evaluation of patient's condition.  Medications Ordered in ED Medications  heparin ADULT infusion 100 units/mL (25000 units/261m) (900 Units/hr Intravenous New Bag/Given 02/26/22 1655)  aspirin EC tablet 81 mg (has no administration in time range)  aspirin tablet 325 mg (325 mg Oral Given 02/26/22 1653)  heparin bolus via infusion 4,000 Units (4,000 Units Intravenous Bolus from Bag 02/26/22 1656)    ED Course/ Medical Decision Making/ A&P                           Medical Decision Making DUlisses VondrakMurinson is a 76y.o. male here presenting with chest pain.  Patient has history of microvascular disease.  He had chest pain when he was at church this morning.  Concern for possible unstable angina versus NSTEMI.  Plan to get CBC and CMP and troponin and EKG and chest x-ray  4:30 pm Initial troponin was 1700.  I discussed case with cardiology and started patient on heparin drip.  Patient also received full dose aspirin.  Patient is pain-free and would like to hold off on nitro right now.  5:52 PM Cardiology to admit for  NSTEMI.  Problems Addressed: NSTEMI (non-ST elevated myocardial infarction) ([facility]: acute illness or injury  Amount and/or Complexity of Data Reviewed Labs: ordered. Decision-making details documented in ED Course. Radiology: ordered and independent interpretation performed. Decision-making details documented in ED Course. ECG/medicine tests: ordered and independent interpretation performed. Decision-making details documented in ED Course.  Risk OTC drugs. Prescription drug management. Decision regarding hospitalization.    Final Clinical Impression(s) / ED Diagnoses Final diagnoses:  None    Rx / DC Orders ED Discharge Orders     None         YDrenda Freeze MD 02/26/22 1752

## 2022-02-26 NOTE — ED Triage Notes (Signed)
Patient complains of a four episode of chest that started suddenly at approximately 0900 this morning and then resolved. Patient describes pain as a severe pressure in his chest. Patient is alert, oriented, speaking in complete sentences, and is in no apparent distress at this time.

## 2022-02-26 NOTE — Progress Notes (Signed)
ANTICOAGULATION CONSULT NOTE - Initial Consult  Pharmacy Consult for heparin Indication: chest pain/ACS  Allergies  Allergen Reactions   Penicillins Anaphylaxis, Other (See Comments) and Hives    Hands swell, throat swells Hands swell, throat swells No reaction listed Hives (pt clarified at office visit)   Fentanyl Nausea And Vomiting and Other (See Comments)   Imdur [Isosorbide Dinitrate] Other (See Comments)    Headache    Patient Measurements: Height: '5\' 6"'$  (167.6 cm) Weight: 74.8 kg (165 lb) IBW/kg (Calculated) : 63.8 Heparin Dosing Weight: TBW  Vital Signs: Temp: 97.9 F (36.6 C) (09/17 1515) Temp Source: Oral (09/17 1515) BP: 182/68 (09/17 1624) Pulse Rate: 50 (09/17 1624)  Labs: Recent Labs    02/26/22 1519  HGB 12.6*  HCT 37.6*  PLT 189  CREATININE 1.28*  TROPONINIHS 1,632*    Estimated Creatinine Clearance: 43.6 mL/min (A) (by C-G formula based on SCr of 1.28 mg/dL (H)).   Medical History: Past Medical History:  Diagnosis Date   Allergic rhinitis    Anemia    pt unaware   Anginal pain ([place])    Microvascular angina- followed by Dr Burt Knack   Arthritis    back   Cancer [facility])    Basal cell -    Coronary artery disease    nonobstructive   DVT (deep venous thrombosis) ([place]) 03/2011   after prolonged travel   GERD (gastroesophageal reflux disease)    Headache    Vision Migraines   Hiatal hernia    small   History of rheumatic fever    questionable   Hyperlipidemia    Increased prostate specific antigen (PSA) velocity    Laryngopharyngeal reflux    Mitral valve prolapse    Palpitations    PE (pulmonary embolism) 03/2011   after prolonged travel   PONV (postoperative nausea and vomiting)    Fentyl- N/V   Pruritus    resolved   Shortness of breath dyspnea    with Exertion   Sinus bradycardia     Assessment: 49 YOM presenting with CP, elevated troponin, he is not on anticoagulation PTA, H/H 12.6/37.6, plts wnl  Goal of Therapy:   Heparin level 0.3-0.7 units/ml Monitor platelets by anticoagulation protocol: Yes   Plan:  Heparin 4000 units IV x 1, and gtt at 900 units/hr F/u 8 hour heparin level F/u cards eval and recs  Bertis Ruddy, PharmD Clinical Pharmacist ED Pharmacist Phone # [phone] 02/26/2022 4:44 PM

## 2022-02-27 ENCOUNTER — Encounter (HOSPITAL_COMMUNITY)
Admission: EM | Disposition: A | Discharge status: Home / Self Care | Payer: Self-pay | Source: Home / Self Care | Attending: Cardiovascular Disease

## 2022-02-27 ENCOUNTER — Inpatient Hospital Stay (HOSPITAL_COMMUNITY): Payer: Medicare Other

## 2022-02-27 DIAGNOSIS — I2510 Atherosclerotic heart disease of native coronary artery without angina pectoris: Secondary | ICD-10-CM | POA: Diagnosis not present

## 2022-02-27 DIAGNOSIS — I214 Non-ST elevation (NSTEMI) myocardial infarction: Secondary | ICD-10-CM

## 2022-02-27 DIAGNOSIS — E785 Hyperlipidemia, unspecified: Secondary | ICD-10-CM | POA: Diagnosis not present

## 2022-02-27 HISTORY — PX: LEFT HEART CATH AND CORONARY ANGIOGRAPHY: CATH118249

## 2022-02-27 LAB — ECHOCARDIOGRAM COMPLETE
AV Vena cont: 0.3 cm
Area-P 1/2: 1.22 cm2
Height: 66 in
S' Lateral: 3.4 cm
Weight: 2613.77 oz

## 2022-02-27 LAB — CBC
HCT: 34.1 % — ABNORMAL LOW (ref 39.0–52.0)
Hemoglobin: 11.7 g/dL — ABNORMAL LOW (ref 13.0–17.0)
MCH: 30.5 pg (ref 26.0–34.0)
MCHC: 34.3 g/dL (ref 30.0–36.0)
MCV: 89 fL (ref 80.0–100.0)
Platelets: 180 K/uL (ref 150–400)
RBC: 3.83 MIL/uL — ABNORMAL LOW (ref 4.22–5.81)
RDW: 13.4 % (ref 11.5–15.5)
WBC: 5.2 K/uL (ref 4.0–10.5)
nRBC: 0 % (ref 0.0–0.2)

## 2022-02-27 LAB — LIPID PANEL
Cholesterol: 140 mg/dL (ref 0–200)
HDL: 34 mg/dL — ABNORMAL LOW (ref >40)
LDL Cholesterol: 79 mg/dL (ref 0–99)
Total CHOL/HDL Ratio: 4.1 RATIO
Triglycerides: 136 mg/dL (ref <150)
VLDL: 27 mg/dL (ref 0–40)

## 2022-02-27 LAB — HEPARIN LEVEL (UNFRACTIONATED)
Heparin Unfractionated: 0.27 [IU]/mL — ABNORMAL LOW (ref 0.30–0.70)
Heparin Unfractionated: 0.47 IU/mL (ref 0.30–0.70)

## 2022-02-27 LAB — MRSA NEXT GEN BY PCR, NASAL: MRSA by PCR Next Gen: NOT DETECTED

## 2022-02-27 LAB — POCT ACTIVATED CLOTTING TIME
Activated Clotting Time: 227 seconds
Activated Clotting Time: 263 seconds

## 2022-02-27 SURGERY — LEFT HEART CATH AND CORONARY ANGIOGRAPHY
Anesthesia: LOCAL

## 2022-02-27 MED ORDER — ORAL CARE MOUTH RINSE: 15.0000 mL | OROMUCOSAL | Status: DC | PRN

## 2022-02-27 MED ORDER — MIDAZOLAM HCL 2 MG/2ML IJ SOLN
INTRAMUSCULAR | Status: AC
  Filled 2022-02-27: qty 2

## 2022-02-27 MED ORDER — SENNOSIDES-DOCUSATE SODIUM 8.6-50 MG PO TABS
1.0000 | ORAL_TABLET | Freq: Two times a day (BID) | ORAL | Status: DC
  Filled 2022-02-27: qty 1

## 2022-02-27 MED ORDER — VERAPAMIL HCL 2.5 MG/ML IV SOLN
INTRAVENOUS | Status: DC | PRN
  Administered 2022-02-27: 10 mL via INTRA_ARTERIAL

## 2022-02-27 MED ORDER — FAMOTIDINE IN NACL 20-0.9 MG/50ML-% IV SOLN
INTRAVENOUS | Status: AC | PRN
  Administered 2022-02-27: 20 mg via INTRAVENOUS

## 2022-02-27 MED ORDER — HEPARIN SODIUM (PORCINE) 1000 UNIT/ML IJ SOLN
INTRAMUSCULAR | Status: AC
  Filled 2022-02-27: qty 10

## 2022-02-27 MED ORDER — HYDRALAZINE HCL 20 MG/ML IJ SOLN: 10.0000 mg | INTRAMUSCULAR | Status: AC | PRN

## 2022-02-27 MED ORDER — ACETAMINOPHEN 325 MG PO TABS: 650.0000 mg | ORAL_TABLET | ORAL | Status: DC | PRN

## 2022-02-27 MED ORDER — PANTOPRAZOLE SODIUM 40 MG PO TBEC
40.0000 mg | DELAYED_RELEASE_TABLET | Freq: Every day | ORAL | Status: DC
  Administered 2022-02-27: 40 mg via ORAL
  Filled 2022-02-27: qty 1

## 2022-02-27 MED ORDER — HEPARIN (PORCINE) IN NACL 1000-0.9 UT/500ML-% IV SOLN
INTRAVENOUS | Status: AC
  Filled 2022-02-27: qty 1000

## 2022-02-27 MED ORDER — SODIUM CHLORIDE 0.9% FLUSH: 3.0000 mL | INTRAVENOUS | Status: DC | PRN

## 2022-02-27 MED ORDER — SODIUM CHLORIDE 0.9 % IV SOLN: 250.0000 mL | INTRAVENOUS | Status: DC | PRN

## 2022-02-27 MED ORDER — CLOPIDOGREL BISULFATE 75 MG PO TABS
75.0000 mg | ORAL_TABLET | Freq: Every day | ORAL | Status: DC
  Administered 2022-02-28: 75 mg via ORAL
  Filled 2022-02-27: qty 1

## 2022-02-27 MED ORDER — IOHEXOL 350 MG/ML SOLN
INTRAVENOUS | Status: DC | PRN
  Administered 2022-02-27: 95 mL

## 2022-02-27 MED ORDER — LIDOCAINE HCL (PF) 1 % IJ SOLN
INTRAMUSCULAR | Status: DC | PRN
  Administered 2022-02-27: 2 mL via SUBCUTANEOUS

## 2022-02-27 MED ORDER — ROSUVASTATIN CALCIUM 20 MG PO TABS
20.0000 mg | ORAL_TABLET | Freq: Every day | ORAL | Status: DC
  Administered 2022-02-27: 20 mg via ORAL
  Filled 2022-02-27: qty 1

## 2022-02-27 MED ORDER — SODIUM CHLORIDE 0.9 % WEIGHT BASED INFUSION
1.0000 mL/kg/h | INTRAVENOUS | Status: AC
  Administered 2022-02-27: 1 mL/kg/h via INTRAVENOUS

## 2022-02-27 MED ORDER — VERAPAMIL HCL 2.5 MG/ML IV SOLN
INTRAVENOUS | Status: AC
  Filled 2022-02-27: qty 2

## 2022-02-27 MED ORDER — HEPARIN (PORCINE) IN NACL 1000-0.9 UT/500ML-% IV SOLN
INTRAVENOUS | Status: DC | PRN
  Administered 2022-02-27 (×2): 500 mL

## 2022-02-27 MED ORDER — SODIUM CHLORIDE 0.9% FLUSH: 3.0000 mL | Freq: Two times a day (BID) | INTRAVENOUS | Status: DC

## 2022-02-27 MED ORDER — LABETALOL HCL 5 MG/ML IV SOLN: 10.0000 mg | INTRAVENOUS | Status: AC | PRN

## 2022-02-27 MED ORDER — LIDOCAINE HCL (PF) 1 % IJ SOLN
INTRAMUSCULAR | Status: AC
  Filled 2022-02-27: qty 30

## 2022-02-27 MED ORDER — MIDAZOLAM HCL 2 MG/2ML IJ SOLN
INTRAMUSCULAR | Status: DC | PRN
  Administered 2022-02-27 (×2): 2 mg via INTRAVENOUS

## 2022-02-27 MED ORDER — FAMOTIDINE IN NACL 20-0.9 MG/50ML-% IV SOLN
INTRAVENOUS | Status: AC
  Filled 2022-02-27: qty 50

## 2022-02-27 MED ORDER — SENNOSIDES-DOCUSATE SODIUM 8.6-50 MG PO TABS
2.0000 | ORAL_TABLET | Freq: Two times a day (BID) | ORAL | Status: DC
  Administered 2022-02-27 – 2022-02-28 (×2): 2 via ORAL
  Filled 2022-02-27 (×2): qty 2

## 2022-02-27 MED ORDER — MAGNESIUM OXIDE -MG SUPPLEMENT 400 (240 MG) MG PO TABS
400.0000 mg | ORAL_TABLET | Freq: Every day | ORAL | Status: DC
  Administered 2022-02-27: 400 mg via ORAL
  Filled 2022-02-27: qty 1

## 2022-02-27 MED ORDER — ONDANSETRON HCL 4 MG/2ML IJ SOLN: 4.0000 mg | Freq: Four times a day (QID) | INTRAMUSCULAR | Status: DC | PRN

## 2022-02-27 MED ORDER — TAMSULOSIN HCL 0.4 MG PO CAPS
0.4000 mg | ORAL_CAPSULE | Freq: Every day | ORAL | Status: DC
  Administered 2022-02-27: 0.4 mg via ORAL
  Filled 2022-02-27: qty 1

## 2022-02-27 MED ORDER — NITROGLYCERIN 1 MG/10 ML FOR IR/CATH LAB
INTRA_ARTERIAL | Status: AC
  Filled 2022-02-27: qty 10

## 2022-02-27 MED ORDER — CLOPIDOGREL BISULFATE 300 MG PO TABS
ORAL_TABLET | ORAL | Status: DC | PRN
  Administered 2022-02-27: 600 mg via ORAL

## 2022-02-27 MED ORDER — HEPARIN SODIUM (PORCINE) 1000 UNIT/ML IJ SOLN
INTRAMUSCULAR | Status: DC | PRN
  Administered 2022-02-27 (×3): 4000 [IU] via INTRAVENOUS

## 2022-02-27 MED ORDER — NITROGLYCERIN 1 MG/10 ML FOR IR/CATH LAB
INTRA_ARTERIAL | Status: DC | PRN
  Administered 2022-02-27: 200 ug via INTRACORONARY

## 2022-02-27 SURGICAL SUPPLY — 16 items
BALLN SAPPHIRE 2.0X15 (BALLOONS) ×1
BALLOON SAPPHIRE 2.0X15 (BALLOONS) IMPLANT
CATH 5FR JL3.5 JR4 ANG PIG MP (CATHETERS) IMPLANT
CATH INFINITI 5 FR 3DRC (CATHETERS) IMPLANT
CATH VISTA GUIDE 6FR XBLAD3.5 (CATHETERS) IMPLANT
DEVICE RAD COMP TR BAND LRG (VASCULAR PRODUCTS) IMPLANT
GLIDESHEATH SLEND SS 6F .021 (SHEATH) IMPLANT
GUIDEWIRE INQWIRE 1.5J.035X260 (WIRE) IMPLANT
INQWIRE 1.5J .035X260CM (WIRE) ×1
KIT ENCORE 26 ADVANTAGE (KITS) IMPLANT
KIT HEART LEFT (KITS) ×1 IMPLANT
PACK CARDIAC CATHETERIZATION (CUSTOM PROCEDURE TRAY) ×1 IMPLANT
TRANSDUCER W/STOPCOCK (MISCELLANEOUS) ×1 IMPLANT
TUBING CIL FLEX 10 FLL-RA (TUBING) ×1 IMPLANT
VALVE GUARDIAN II ~~LOC~~ HEMO (MISCELLANEOUS) IMPLANT
WIRE COUGAR XT STRL 190CM (WIRE) IMPLANT

## 2022-02-27 NOTE — Interval H&P Note (Signed)
History and Physical Interval Note:  02/27/2022 2:26 PM  Nathaniel [name] Payment  has presented today for surgery, with the diagnosis of NSTEMI.  The various methods of treatment have been discussed with the patient and family. After consideration of risks, benefits and other options for treatment, the patient has consented to  Procedure(s): LEFT HEART CATH AND CORONARY ANGIOGRAPHY (N/A) as a surgical intervention.  The patient's history has been reviewed, patient examined, no change in status, stable for surgery.  I have reviewed the patient's chart and labs.  Questions were answered to the patient's satisfaction.     Sherren Mocha

## 2022-02-27 NOTE — Progress Notes (Signed)
Echocardiogram 2D Echocardiogram has been performed.  Oneal Deputy Ashtian Villacis RDCS 02/27/2022, 10:41 AM

## 2022-02-27 NOTE — Plan of Care (Signed)

## 2022-02-27 NOTE — Interval H&P Note (Signed)
  Cath Lab Visit (complete for each Cath Lab visit)  Clinical Evaluation Leading to the Procedure:   ACS: Yes.    Non-ACS:    Anginal Classification: CCS IV  Anti-ischemic medical therapy: Minimal Therapy (1 class of medications)  Non-Invasive Test Results: No non-invasive testing performed  Prior CABG: No previous CABG      History and Physical Interval Note:  02/27/2022 3:39 PM  Haynes Bast Wisby  has presented today for surgery, with the diagnosis of NSTEMI.  The various methods of treatment have been discussed with the patient and family. After consideration of risks, benefits and other options for treatment, the patient has consented to  Procedure(s): LEFT HEART CATH AND CORONARY ANGIOGRAPHY (N/A) as a surgical intervention.  The patient's history has been reviewed, patient examined, no change in status, stable for surgery.  I have reviewed the patient's chart and labs.  Questions were answered to the patient's satisfaction.     Sherren Mocha

## 2022-02-27 NOTE — Progress Notes (Signed)
Mobility Specialist Progress Note    02/27/22 1218  Mobility  Activity Ambulated with assistance in hallway  Level of Assistance Standby assist, set-up cues, supervision of patient - no hands on  Assistive Device Other (Comment) (IV pole)  Distance Ambulated (ft) 350 ft  Activity Response Tolerated well  $Mobility charge 1 Mobility   Pre-Mobility: 46 HR, 97% SpO2 During Mobility: 52 HR Post-Mobility: 41 HR, 98% SpO2  Pt received sitting EOB and agreeable. No complaints on walk. Returned to sitting EOB with call bell in reach.    Hildred Alamin Mobility Specialist

## 2022-02-27 NOTE — Progress Notes (Signed)
[place] for heparin Indication: chest pain/ACS  Allergies  Allergen Reactions   Penicillins Anaphylaxis, Other (See Comments) and Hives    Hands swell, throat swells Hands swell, throat swells No reaction listed Hives (pt clarified at office visit)   Fentanyl Nausea And Vomiting and Other (See Comments)   Imdur [Isosorbide Dinitrate] Other (See Comments)    Headache    Patient Measurements: Height: '5\' 6"'$  (167.6 cm) Weight: 74.1 kg (163 lb 5.8 oz) IBW/kg (Calculated) : 63.8 Heparin Dosing Weight: TBW  Vital Signs: Temp: 97.5 F (36.4 C) (09/18 1058) Temp Source: Oral (09/18 1058) BP: 148/67 (09/18 1058) Pulse Rate: 40 (09/18 1058)  Labs: Recent Labs    02/26/22 1519 02/26/22 1643 02/27/22 0129 02/27/22 1127  HGB 12.6*  --  11.7*  --   HCT 37.6*  --  34.1*  --   PLT 189  --  180  --   HEPARINUNFRC  --   --  0.27* 0.47  CREATININE 1.28*  --   --   --   TROPONINIHS 1,632* 3,332*  --   --      Estimated Creatinine Clearance: 43.6 mL/min (A) (by C-G formula based on SCr of 1.28 mg/dL (H)).   Medical History: Past Medical History:  Diagnosis Date   Allergic rhinitis    Anemia    pt unaware   Anginal pain ([place])    Microvascular angina- followed by Dr Burt Knack   Arthritis    back   Cancer [facility])    Basal cell -    Coronary artery disease    nonobstructive   DVT (deep venous thrombosis) ([place]) 03/2011   after prolonged travel   GERD (gastroesophageal reflux disease)    Headache    Vision Migraines   Hiatal hernia    small   History of rheumatic fever    questionable   Hyperlipidemia    Increased prostate specific antigen (PSA) velocity    Laryngopharyngeal reflux    Mitral valve prolapse    Palpitations    PE (pulmonary embolism) 03/2011   after prolonged travel   PONV (postoperative nausea and vomiting)    Fentyl- N/V   Pruritus    resolved   Shortness of breath dyspnea    with Exertion   Sinus bradycardia      Assessment: 87 YOM presenting with CP, elevated troponin, he is not on anticoagulation PTA. Pharmacy dosing heparin. Plans noted for cardiac cath today.   Heparin level 0.47  Goal of Therapy:  Heparin level 0.3-0.7 units/ml Monitor platelets by anticoagulation protocol: Yes   Plan:  Continue heparin at 1000 units/hr Will follow plans post cath  Hildred Laser, PharmD Clinical Pharmacist **Pharmacist phone directory can now be found on [email or web address] ([email or web address]).  Listed under [place].

## 2022-02-27 NOTE — Progress Notes (Signed)
Rounding Note    Patient Name: Nathaniel [name] Date of Encounter: 02/27/2022  [place] Cardiologist: Sherren Mocha, MD   Subjective   No recurrent chest pain; had approximately 4-1/2 hours of waxing and waning chest tightness during the [employer] [place] celebration [place] [employer].   Inpatient Medications    Scheduled Meds:  amLODipine  2.5 mg Oral Daily   aspirin EC  81 mg Oral Daily   cholecalciferol  1,000 Units Oral q morning   vitamin B-12  2,500 mcg Oral Once per day on Mon Wed Fri   pantoprazole  40 mg Oral Daily   rosuvastatin  20 mg Oral Daily   sodium chloride flush  3 mL Intravenous Q12H   sodium chloride flush  3 mL Intravenous Q12H   tamsulosin  0.4 mg Oral QHS   Continuous Infusions:  sodium chloride     sodium chloride     sodium chloride 1 mL/kg/hr (02/27/22 0509)   heparin 1,000 Units/hr (02/27/22 0403)   PRN Meds: sodium chloride, sodium chloride, acetaminophen, nitroGLYCERIN, ondansetron (ZOFRAN) IV, mouth rinse, sodium chloride flush, sodium chloride flush   Vital Signs    Vitals:   02/27/22 0400 02/27/22 0404 02/27/22 0600 02/27/22 0700  BP:  (!) 117/50 (!) 117/50 130/61  Pulse: (!) 41 (!) 43 (!) 36 (!) 38  Resp: '16 17 13 15  '$ Temp:    98.1 F (36.7 C)  TempSrc:    Oral  SpO2: 96% 99% 96% 94%  Weight:      Height:        Intake/Output Summary (Last 24 hours) at 02/27/2022 0951 Last data filed at 02/27/2022 0730 Gross per 24 hour  Intake 141.15 ml  Output 200 ml  Net -58.85 ml      02/27/2022    1:17 AM 02/26/2022    4:26 PM 01/18/2022    4:15 PM  Last 3 Weights  Weight (lbs) 163 lb 5.8 oz 165 lb 167 lb  Weight (kg) 74.1 kg 74.844 kg 75.751 kg      Telemetry    Sinus bradycardia at 44.  No ectopy - Personally Reviewed  ECG    ECG (independently read by me): Marked sinus bradycardia at 37 bpm.  First-degree AV block with PR interval 240 ms.  LVH.  Physical Exam   BP 130/61 (BP Location: Right Arm)   Pulse  (!) 38   Temp 98.1 F (36.7 C) (Oral)   Resp 15   Ht '5\' 6"'$  (1.676 m)   Wt 74.1 kg   SpO2 94%   BMI 26.37 kg/m  General: Alert, oriented, no distress.  Skin: normal turgor, no rashes, warm and dry HEENT: Normocephalic, atraumatic. Pupils equal round and reactive to light; sclera anicteric; extraocular muscles intact;  Nose without nasal septal hypertrophy Mouth/Parynx benign; Neck: No JVD, no carotid bruits; normal carotid upstroke Lungs: clear to ausculatation and percussion; no wheezing or rales Chest wall: without tenderness to palpitation Heart: PMI not displaced, RRR, s1 s2 normal, no systolic murmur, trivial diastolic murmur, no rubs, gallops, thrills, or heaves Abdomen: soft, nontender; no hepatosplenomehaly, BS+; abdominal aorta nontender and not dilated by palpation. Back: no CVA tenderness Pulses 2+ Musculoskeletal: full range of motion, normal strength, no joint deformities Extremities: no clubbing cyanosis or edema, Homan's sign negative  Neurologic: grossly nonfocal; Cranial nerves grossly wnl Psychologic: Normal mood and affect     Labs    High Sensitivity Troponin:   Recent Labs  Lab 02/26/22 1519  02/26/22 1643  TROPONINIHS 1,632* 3,332*     Chemistry Recent Labs  Lab 02/26/22 1519 02/26/22 1643  NA 139  --   K 3.7  --   CL 107  --   CO2 25  --   GLUCOSE 107*  --   BUN 18  --   CREATININE 1.28*  --   CALCIUM 9.1  --   PROT  --  6.3*  ALBUMIN  --  4.1  AST  --  26  ALT  --  18  ALKPHOS  --  45  BILITOT  --  0.8  GFRNONAA 58*  --   ANIONGAP 7  --     Lipids  Recent Labs  Lab 02/27/22 0129  CHOL 140  TRIG 136  HDL 34*  LDLCALC 79  CHOLHDL 4.1    Hematology Recent Labs  Lab 02/26/22 1519 02/27/22 0129  WBC 5.0 5.2  RBC 4.17* 3.83*  HGB 12.6* 11.7*  HCT 37.6* 34.1*  MCV 90.2 89.0  MCH 30.2 30.5  MCHC 33.5 34.3  RDW 13.4 13.4  PLT 189 180   Thyroid  Recent Labs  Lab 02/26/22 1643  TSH 1.481    BNPNo results for  input(s): "BNP", "PROBNP" in the last 168 hours.  DDimer No results for input(s): "DDIMER" in the last 168 hours.   Radiology    DG Chest 2 View  Result Date: 02/26/2022 CLINICAL DATA:  Sudden onset of intermittent chest pain since this morning EXAM: CHEST - 2 VIEW COMPARISON:  06/17/2021 FINDINGS: Frontal and lateral views of the chest demonstrate an unremarkable cardiac silhouette. No acute airspace disease, effusion, or pneumothorax. No acute bony abnormalities. IMPRESSION: 1. Stable chest, no acute process. Electronically Signed   By: Randa Ngo M.D.   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  He underwent catheterization by Dr.  Vicenta Aly in May 2006 at which time he had a myocardial bridging in the  proximal LAD with compression to 40-50% and minimal plaque documented by  IVUS proximal to the area for systolic compression.  He subsequently had  evaluation by Dr. Jenne Pane at [facility] and had an exercise MRI which  showed no evidence of ischemia and specifically, no evidence of ischemia  in the distribution of the LAD.  Based on these findings, he is felt to  have microvascular angina.  Therapy has been limited by a slow heart  rate which has precluded beta blockers and an intolerance of nitrates  due to headache and nonresponsiveness to calcium channel blockers.  Recently his symptoms increased in severity and they occurred with very  minimal exertion.  We made a decision to reevaluate him with  angiography.  We considered CT angiography, but he was concerned about  the radiation so we decided on conventional angiography to rule out  development of obstructive coronary disease.    PROCEDURE:  The procedure was performed via the right femoral artery and  arterial sheath and 4-French preformed coronary catheters.   A front wall  arterial puncture was performed and Omnipaque contrast was used.  The  patient did have chest discomfort with the contrast injections.  The  nitroglycerin was given intracoronary to further evaluate the LAD  bridging.  The patient tolerated the procedure well and left the  laboratory in satisfactory condition.  He received 1 mg of Versed at the  beginning of the procedure and 2 mg of morphine at the end of the  procedure.    RESULTS:  Hemodynamic data:  The aortic pressure was 116/64 with a mean  of 86.  The left ventricular pressure is 116/13.    Left main coronary main free of significant disease.    Left anterior descending artery gave rise to a large diagonal branch  with two sub-branches and two septal perforators.  There was mild 40%  narrowing in one of the sub branches of the diagonal branch.  There was  30% narrowing in the proximal LAD.  There was systolic bridging in the  proximal LAD just distal to the mild plaque which narrows the lumen to  about 40% during systole.  The distal vessel is free of major  obstruction.    The circumflex artery gave rise to two marginal branches and a large and  small posterolateral branch.  These vessels were free of significant  disease.    The right coronary artery was a moderately large vessel, gave rise to a  conus branch, two right ventricular branches, a posterior descending  branch, and two posterolateral branches.  This vessel is free of  significant disease but there was TIMI-2 flow in the vessel.  It took 4  cycles for the vessel to completely fill and it appeared that the  myocardial blush did not wash out rapidly.    The left ventriculography was done in the RAO projection and showed good  wall motion with no areas of hypokinesis.  Estimated ejection fraction  was 55%..    CONCLUSION:  Mild nonobstructive coronary artery disease with 30 cm  narrowing in the proximal left anterior descending, 88% systolic   compression in the proximal left anterior descending, and TIMI-2 flow in  the right coronary with normal left ventricular function.    RECOMMENDATIONS:  The patient has minimal nonobstructive disease and I  do not think the systolic bridging is responsible for his symptoms.  I  think Timmothy Sours meets all the criteria for the diagnosis of microvascular  angina syndrome X.  I will discuss further treatment with him.  We may  reconsider with Renexa although he has had some reluctance to try this  in the past.  Will also consider referral back to Dr. Jenne Pane at  [facility].     Coronary CT angiogram March 2023   Left main: The left main is a large caliber vessel with a normal take off from the left coronary cusp that bifurcates to form a left anterior descending artery and a left circumflex artery. There is mild calcified plaque in the proximal LM with associated stenosis of 25-49%.   Left anterior descending artery: The LAD is patent without evidence of plaque or stenosis. The LAD gives off 1 large branching diagonal. There is mild calcified plaque in the proximal LAD with associated stenosis of 25-49%. There is mild soft plaque in the mid LAD with associated stenosis of 25-49%.   Left circumflex artery: The LCX is non-dominant. The LCX gives off 2 patent obtuse marginal branches. There is mild calcified plaque in the proximal LCx with associated stenosis of 25-49%.   Right coronary artery: The RCA is dominant with normal take off from the right coronary cusp. There is no evidence of plaque or stenosis. The RCA terminates as a PDA and right posterolateral branch without evidence of plaque or stenosis.  Right Atrium: Right atrial size is within normal limits.   Right Ventricle: The right ventricular cavity is within normal limits.   Left Atrium: Left atrial size is normal in size with no left atrial appendage filling defect.   Left Ventricle: The ventricular cavity size  is within normal limits. There are no stigmata of prior infarction. There is no abnormal filling defect.   Pulmonary arteries: Normal in size without proximal filling defect.   Pulmonary veins: Normal pulmonary venous drainage.   Pericardium: Normal thickness with no significant effusion or calcium present.   Cardiac valves: The aortic valve is trileaflet without significant calcification. The mitral valve is normal structure without significant calcification.   Aorta: Normal caliber with no significant disease.   Extra-cardiac findings: See attached radiology report for non-cardiac structures.   IMPRESSION: 1. Coronary calcium score of 149. This was 38th percentile for age-, sex, and race-matched controls.   2.  Normal coronary origin with right dominance.   3.  Mild atherosclerosis.  CAD RADS 2.   4.  Recommend preventive therapy and risk factor modification.   5.  Consider non atherosclerotic causes of chest pain.   14-day event monitor June 2023 SUMMARY: The basic rhythm is normal sinus with an average HR of 53 bpm No atrial fibrillation or flutter No high-grade heart block There are rare PVC's and rare supraventricular beats, both occurring less than 1% burden The sinus rate is as low as 34 bpm without AV block or junctional beats seen Rare supraventricular runs limited to 9 beats for the longest run   Echocardiogram November 2022  1. Left ventricular ejection fraction, by estimation, is 55 to 60%. The  left ventricle has normal function. The left ventricle has no regional  wall motion abnormalities. Left ventricular diastolic parameters are  consistent with Grade I diastolic  dysfunction (impaired relaxation).   2. Right ventricular systolic function is normal. The right ventricular  size is normal. There is normal pulmonary artery systolic pressure. The  estimated right ventricular systolic pressure is 62.6 mmHg.   3. Moderate elongation of the mitral anterior  and posterior leaflets. No  prolapse per current criteria noted.   4. The mitral valve is abnormal. trivial to mild mitral valve  regurgitation.   5. The aortic valve is tricuspid. Aortic valve regurgitation is mild.   6. The inferior vena cava is normal in size with greater than 50%  respiratory variability, suggesting right atrial pressure of 3 mmHg.   Comparison(s): Changes from prior study are noted. 05/01/2019: LVEF  50-55%, mild AI.     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  He had remotely been seen care by Dr. Maurene Capes and more recently by Dr. Burt Knack.  He developed nonexertional chest tightness which lasted for approximately 4-1/2 hours and a waxing and waning pattern Saturday while as an Us[employer] at his to his in celebration of the [place].  Assessment & Plan    NSTEMI: He has a history of microvascular angina and has previously undergone 3 prior cardiac catheterizations with his last in 2008.  He did not tolerate nitrates medication.  He has been off any therapy but developed a prolonged episode of chest tightness leading to admission on February 26, 2022.  ECG does not show acute function but there is marked sinus bradycardia.  He has previously undergone coronary CTA imaging in March 2023 which showed nonobstructive CAD.  Plan definitive cardiac catheterization  with Dr. Burt Knack today.  Consider initiation of Ranexa to his medical regimen.  The patient received an initial dose of amlodipine 2.5 mg this morning.  Patient aware of risk/benefits of the procedure. Hyperlipidemia: LDL cholesterol 79.  Rosuvastatin was increased to 20 mg. Mild aortic regurgitation on echocardiography  For questions or updates, please contact [place] Please consult [email or web address] for contact info under        Signed, Shelva Majestic, MD  02/27/2022, 9:51 AM

## 2022-02-27 NOTE — Progress Notes (Signed)
ANTICOAGULATION CONSULT NOTE - Initial Consult  Pharmacy Consult for heparin Indication: chest pain/ACS  Allergies  Allergen Reactions   Penicillins Anaphylaxis, Other (See Comments) and Hives    Hands swell, throat swells Hands swell, throat swells No reaction listed Hives (pt clarified at office visit)   Fentanyl Nausea And Vomiting and Other (See Comments)   Imdur [Isosorbide Dinitrate] Other (See Comments)    Headache    Patient Measurements: Height: '5\' 6"'$  (167.6 cm) Weight: 74.1 kg (163 lb 5.8 oz) IBW/kg (Calculated) : 63.8 Heparin Dosing Weight: TBW  Vital Signs: Temp: 97.5 F (36.4 C) (09/17 2228) Temp Source: Oral (09/17 2228) BP: 152/67 (09/17 2227) Pulse Rate: 46 (09/17 2300)  Labs: Recent Labs    02/26/22 1519 02/26/22 1643 02/27/22 0129  HGB 12.6*  --  11.7*  HCT 37.6*  --  34.1*  PLT 189  --  180  HEPARINUNFRC  --   --  0.27*  CREATININE 1.28*  --   --   TROPONINIHS 3,343* 3,332*  --      Estimated Creatinine Clearance: 43.6 mL/min (A) (by C-G formula based on SCr of 1.28 mg/dL (H)).   Medical History: Past Medical History:  Diagnosis Date   Allergic rhinitis    Anemia    pt unaware   Anginal pain ([place])    Microvascular angina- followed by Dr Burt Knack   Arthritis    back   [facility])    Basal cell -    Coronary artery disease    nonobstructive   DVT (deep venous thrombosis) ([place]) 03/2011   after prolonged travel   GERD (gastroesophageal reflux disease)    Headache    Vision Migraines   Hiatal hernia    small   History of rheumatic fever    questionable   Hyperlipidemia    Increased prostate specific antigen (PSA) velocity    Laryngopharyngeal reflux    Mitral valve prolapse    Palpitations    PE (pulmonary embolism) 03/2011   after prolonged travel   PONV (postoperative nausea and vomiting)    Fentyl- N/V   Pruritus    resolved   Shortness of breath dyspnea    with Exertion   Sinus bradycardia     Assessment: 24 YOM  presenting with CP, elevated troponin, he is not on anticoagulation PTA, H/H 12.6/37.6, plts wnl  Heparin level 0.27  Goal of Therapy:  Heparin level 0.3-0.7 units/ml Monitor platelets by anticoagulation protocol: Yes   Plan:  Heparin at 1000 units/hr F/u 8 hour heparin level F/u cards eval and recs  Alanda Slim, PharmD, [facility] Clinical Pharmacist Please see AMION for all Pharmacists' Contact Phone Numbers 02/27/2022, 2:20 AM

## 2022-02-27 NOTE — Progress Notes (Signed)
Patient back to floor from cath lab. Scant amount of fresh red blood in creases of TR band on inside. Reported to me that this happened during the band placement and is not new fresh oozing.

## 2022-02-27 NOTE — H&P (View-Only) (Signed)
Rounding Note    Patient Name: Nathaniel [name] Date of Encounter: 02/27/2022  [place] Cardiologist: Sherren Mocha, MD   Subjective   No recurrent chest pain; had approximately 4-1/2 hours of waxing and waning chest tightness during the [employer] [place] celebration [place] [employer].   Inpatient Medications    Scheduled Meds:  amLODipine  2.5 mg Oral Daily   aspirin EC  81 mg Oral Daily   cholecalciferol  1,000 Units Oral q morning   vitamin B-12  2,500 mcg Oral Once per day on Mon Wed Fri   pantoprazole  40 mg Oral Daily   rosuvastatin  20 mg Oral Daily   sodium chloride flush  3 mL Intravenous Q12H   sodium chloride flush  3 mL Intravenous Q12H   tamsulosin  0.4 mg Oral QHS   Continuous Infusions:  sodium chloride     sodium chloride     sodium chloride 1 mL/kg/hr (02/27/22 0509)   heparin 1,000 Units/hr (02/27/22 0403)   PRN Meds: sodium chloride, sodium chloride, acetaminophen, nitroGLYCERIN, ondansetron (ZOFRAN) IV, mouth rinse, sodium chloride flush, sodium chloride flush   Vital Signs    Vitals:   02/27/22 0400 02/27/22 0404 02/27/22 0600 02/27/22 0700  BP:  (!) 117/50 (!) 117/50 130/61  Pulse: (!) 41 (!) 43 (!) 36 (!) 38  Resp: '16 17 13 15  '$ Temp:    98.1 F (36.7 C)  TempSrc:    Oral  SpO2: 96% 99% 96% 94%  Weight:      Height:        Intake/Output Summary (Last 24 hours) at 02/27/2022 0951 Last data filed at 02/27/2022 0730 Gross per 24 hour  Intake 141.15 ml  Output 200 ml  Net -58.85 ml      02/27/2022    1:17 AM 02/26/2022    4:26 PM 01/18/2022    4:15 PM  Last 3 Weights  Weight (lbs) 163 lb 5.8 oz 165 lb 167 lb  Weight (kg) 74.1 kg 74.844 kg 75.751 kg      Telemetry    Sinus bradycardia at 44.  No ectopy - Personally Reviewed  ECG    ECG (independently read by me): Marked sinus bradycardia at 37 bpm.  First-degree AV block with PR interval 240 ms.  LVH.  Physical Exam   BP 130/61 (BP Location: Right Arm)   Pulse  (!) 38   Temp 98.1 F (36.7 C) (Oral)   Resp 15   Ht '5\' 6"'$  (1.676 m)   Wt 74.1 kg   SpO2 94%   BMI 26.37 kg/m  General: Alert, oriented, no distress.  Skin: normal turgor, no rashes, warm and dry HEENT: Normocephalic, atraumatic. Pupils equal round and reactive to light; sclera anicteric; extraocular muscles intact;  Nose without nasal septal hypertrophy Mouth/Parynx benign; Neck: No JVD, no carotid bruits; normal carotid upstroke Lungs: clear to ausculatation and percussion; no wheezing or rales Chest wall: without tenderness to palpitation Heart: PMI not displaced, RRR, s1 s2 normal, no systolic murmur, trivial diastolic murmur, no rubs, gallops, thrills, or heaves Abdomen: soft, nontender; no hepatosplenomehaly, BS+; abdominal aorta nontender and not dilated by palpation. Back: no CVA tenderness Pulses 2+ Musculoskeletal: full range of motion, normal strength, no joint deformities Extremities: no clubbing cyanosis or edema, Homan's sign negative  Neurologic: grossly nonfocal; Cranial nerves grossly wnl Psychologic: Normal mood and affect     Labs    High Sensitivity Troponin:   Recent Labs  Lab 02/26/22 1519  02/26/22 1643  TROPONINIHS 1,632* 3,332*     Chemistry Recent Labs  Lab 02/26/22 1519 02/26/22 1643  NA 139  --   K 3.7  --   CL 107  --   CO2 25  --   GLUCOSE 107*  --   BUN 18  --   CREATININE 1.28*  --   CALCIUM 9.1  --   PROT  --  6.3*  ALBUMIN  --  4.1  AST  --  26  ALT  --  18  ALKPHOS  --  45  BILITOT  --  0.8  GFRNONAA 58*  --   ANIONGAP 7  --     Lipids  Recent Labs  Lab 02/27/22 0129  CHOL 140  TRIG 136  HDL 34*  LDLCALC 79  CHOLHDL 4.1    Hematology Recent Labs  Lab 02/26/22 1519 02/27/22 0129  WBC 5.0 5.2  RBC 4.17* 3.83*  HGB 12.6* 11.7*  HCT 37.6* 34.1*  MCV 90.2 89.0  MCH 30.2 30.5  MCHC 33.5 34.3  RDW 13.4 13.4  PLT 189 180   Thyroid  Recent Labs  Lab 02/26/22 1643  TSH 1.481    BNPNo results for  input(s): "BNP", "PROBNP" in the last 168 hours.  DDimer No results for input(s): "DDIMER" in the last 168 hours.   Radiology    DG Chest 2 View  Result Date: 02/26/2022 CLINICAL DATA:  Sudden onset of intermittent chest pain since this morning EXAM: CHEST - 2 VIEW COMPARISON:  06/17/2021 FINDINGS: Frontal and lateral views of the chest demonstrate an unremarkable cardiac silhouette. No acute airspace disease, effusion, or pneumothorax. No acute bony abnormalities. IMPRESSION: 1. Stable chest, no acute process. Electronically Signed   By: Randa Ngo M.D.   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  He underwent catheterization by Dr.  Vicenta Aly in May 2006 at which time he had a myocardial bridging in the  proximal LAD with compression to 40-50% and minimal plaque documented by  IVUS proximal to the area for systolic compression.  He subsequently had  evaluation by Dr. Jenne Pane at [facility] and had an exercise MRI which  showed no evidence of ischemia and specifically, no evidence of ischemia  in the distribution of the LAD.  Based on these findings, he is felt to  have microvascular angina.  Therapy has been limited by a slow heart  rate which has precluded beta blockers and an intolerance of nitrates  due to headache and nonresponsiveness to calcium channel blockers.  Recently his symptoms increased in severity and they occurred with very  minimal exertion.  We made a decision to reevaluate him with  angiography.  We considered CT angiography, but he was concerned about  the radiation so we decided on conventional angiography to rule out  development of obstructive coronary disease.    PROCEDURE:  The procedure was performed via the right femoral artery and  arterial sheath and 4-French preformed coronary catheters.   A front wall  arterial puncture was performed and Omnipaque contrast was used.  The  patient did have chest discomfort with the contrast injections.  The  nitroglycerin was given intracoronary to further evaluate the LAD  bridging.  The patient tolerated the procedure well and left the  laboratory in satisfactory condition.  He received 1 mg of Versed at the  beginning of the procedure and 2 mg of morphine at the end of the  procedure.    RESULTS:  Hemodynamic data:  The aortic pressure was 116/64 with a mean  of 86.  The left ventricular pressure is 116/13.    Left main coronary main free of significant disease.    Left anterior descending artery gave rise to a large diagonal branch  with two sub-branches and two septal perforators.  There was mild 40%  narrowing in one of the sub branches of the diagonal branch.  There was  30% narrowing in the proximal LAD.  There was systolic bridging in the  proximal LAD just distal to the mild plaque which narrows the lumen to  about 40% during systole.  The distal vessel is free of major  obstruction.    The circumflex artery gave rise to two marginal branches and a large and  small posterolateral branch.  These vessels were free of significant  disease.    The right coronary artery was a moderately large vessel, gave rise to a  conus branch, two right ventricular branches, a posterior descending  branch, and two posterolateral branches.  This vessel is free of  significant disease but there was TIMI-2 flow in the vessel.  It took 4  cycles for the vessel to completely fill and it appeared that the  myocardial blush did not wash out rapidly.    The left ventriculography was done in the RAO projection and showed good  wall motion with no areas of hypokinesis.  Estimated ejection fraction  was 55%..    CONCLUSION:  Mild nonobstructive coronary artery disease with 30 cm  narrowing in the proximal left anterior descending, 25% systolic   compression in the proximal left anterior descending, and TIMI-2 flow in  the right coronary with normal left ventricular function.    RECOMMENDATIONS:  The patient has minimal nonobstructive disease and I  do not think the systolic bridging is responsible for his symptoms.  I  think Timmothy Sours meets all the criteria for the diagnosis of microvascular  angina syndrome X.  I will discuss further treatment with him.  We may  reconsider with Renexa although he has had some reluctance to try this  in the past.  Will also consider referral back to Dr. Jenne Pane at  [facility].     Coronary CT angiogram March 2023   Left main: The left main is a large caliber vessel with a normal take off from the left coronary cusp that bifurcates to form a left anterior descending artery and a left circumflex artery. There is mild calcified plaque in the proximal LM with associated stenosis of 25-49%.   Left anterior descending artery: The LAD is patent without evidence of plaque or stenosis. The LAD gives off 1 large branching diagonal. There is mild calcified plaque in the proximal LAD with associated stenosis of 25-49%. There is mild soft plaque in the mid LAD with associated stenosis of 25-49%.   Left circumflex artery: The LCX is non-dominant. The LCX gives off 2 patent obtuse marginal branches. There is mild calcified plaque in the proximal LCx with associated stenosis of 25-49%.   Right coronary artery: The RCA is dominant with normal take off from the right coronary cusp. There is no evidence of plaque or stenosis. The RCA terminates as a PDA and right posterolateral branch without evidence of plaque or stenosis.  Right Atrium: Right atrial size is within normal limits.   Right Ventricle: The right ventricular cavity is within normal limits.   Left Atrium: Left atrial size is normal in size with no left atrial appendage filling defect.   Left Ventricle: The ventricular cavity size  is within normal limits. There are no stigmata of prior infarction. There is no abnormal filling defect.   Pulmonary arteries: Normal in size without proximal filling defect.   Pulmonary veins: Normal pulmonary venous drainage.   Pericardium: Normal thickness with no significant effusion or calcium present.   Cardiac valves: The aortic valve is trileaflet without significant calcification. The mitral valve is normal structure without significant calcification.   Aorta: Normal caliber with no significant disease.   Extra-cardiac findings: See attached radiology report for non-cardiac structures.   IMPRESSION: 1. Coronary calcium score of 149. This was 38th percentile for age-, sex, and race-matched controls.   2.  Normal coronary origin with right dominance.   3.  Mild atherosclerosis.  CAD RADS 2.   4.  Recommend preventive therapy and risk factor modification.   5.  Consider non atherosclerotic causes of chest pain.   14-day event monitor June 2023 SUMMARY: The basic rhythm is normal sinus with an average HR of 53 bpm No atrial fibrillation or flutter No high-grade heart block There are rare PVC's and rare supraventricular beats, both occurring less than 1% burden The sinus rate is as low as 34 bpm without AV block or junctional beats seen Rare supraventricular runs limited to 9 beats for the longest run   Echocardiogram November 2022  1. Left ventricular ejection fraction, by estimation, is 55 to 60%. The  left ventricle has normal function. The left ventricle has no regional  wall motion abnormalities. Left ventricular diastolic parameters are  consistent with Grade I diastolic  dysfunction (impaired relaxation).   2. Right ventricular systolic function is normal. The right ventricular  size is normal. There is normal pulmonary artery systolic pressure. The  estimated right ventricular systolic pressure is 24.4 mmHg.   3. Moderate elongation of the mitral anterior  and posterior leaflets. No  prolapse per current criteria noted.   4. The mitral valve is abnormal. trivial to mild mitral valve  regurgitation.   5. The aortic valve is tricuspid. Aortic valve regurgitation is mild.   6. The inferior vena cava is normal in size with greater than 50%  respiratory variability, suggesting right atrial pressure of 3 mmHg.   Comparison(s): Changes from prior study are noted. 05/01/2019: LVEF  50-55%, mild AI.     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  He had remotely been seen care by Dr. Maurene Capes and more recently by Dr. Burt Knack.  He developed nonexertional chest tightness which lasted for approximately 4-1/2 hours and a waxing and waning pattern Saturday while as an [employer] at his to his in celebration of the [place].  Assessment & Plan    NSTEMI: He has a history of microvascular angina and has previously undergone 3 prior cardiac catheterizations with his last in 2008.  He did not tolerate nitrates medication.  He has been off any therapy but developed a prolonged episode of chest tightness leading to admission on February 26, 2022.  ECG does not show acute function but there is marked sinus bradycardia.  He has previously undergone coronary CTA imaging in March 2023 which showed nonobstructive CAD.  Plan definitive cardiac catheterization  with Dr. Burt Knack today.  Consider initiation of Ranexa to his medical regimen.  The patient received an initial dose of amlodipine 2.5 mg this morning.  Patient aware of risk/benefits of the procedure. Hyperlipidemia: LDL cholesterol 79.  Rosuvastatin was increased to 20 mg. Mild aortic regurgitation on echocardiography  For questions or updates, please contact [place] Please consult [email or web address] for contact info under        Signed, Shelva Majestic, MD  02/27/2022, 9:51 AM

## 2022-02-28 ENCOUNTER — Other Ambulatory Visit (HOSPITAL_COMMUNITY): Payer: Self-pay

## 2022-02-28 ENCOUNTER — Telehealth: Payer: Self-pay

## 2022-02-28 ENCOUNTER — Encounter (HOSPITAL_COMMUNITY): Payer: Self-pay | Admitting: Cardiovascular Disease

## 2022-02-28 DIAGNOSIS — E785 Hyperlipidemia, unspecified: Secondary | ICD-10-CM | POA: Diagnosis not present

## 2022-02-28 DIAGNOSIS — I214 Non-ST elevation (NSTEMI) myocardial infarction: Secondary | ICD-10-CM | POA: Diagnosis not present

## 2022-02-28 LAB — LIPOPROTEIN A (LPA): Lipoprotein (a): 76.5 nmol/L — ABNORMAL HIGH (ref <75.0)

## 2022-02-28 LAB — CBC
HCT: 35.2 % — ABNORMAL LOW (ref 39.0–52.0)
Hemoglobin: 11.7 g/dL — ABNORMAL LOW (ref 13.0–17.0)
MCH: 29.9 pg (ref 26.0–34.0)
MCHC: 33.2 g/dL (ref 30.0–36.0)
MCV: 90 fL (ref 80.0–100.0)
Platelets: 175 10*3/uL (ref 150–400)
RBC: 3.91 MIL/uL — ABNORMAL LOW (ref 4.22–5.81)
RDW: 13.6 % (ref 11.5–15.5)
WBC: 3.7 10*3/uL — ABNORMAL LOW (ref 4.0–10.5)
nRBC: 0 % (ref 0.0–0.2)

## 2022-02-28 LAB — BASIC METABOLIC PANEL
Anion gap: 4 — ABNORMAL LOW (ref 5–15)
BUN: 15 mg/dL (ref 8–23)
CO2: 26 mmol/L (ref 22–32)
Calcium: 8.8 mg/dL — ABNORMAL LOW (ref 8.9–10.3)
Chloride: 110 mmol/L (ref 98–111)
Creatinine, Ser: 1.23 mg/dL (ref 0.61–1.24)
GFR, Estimated: >60 mL/min (ref >60)
Glucose, Bld: 115 mg/dL — ABNORMAL HIGH (ref 70–99)
Potassium: 4 mmol/L (ref 3.5–5.1)
Sodium: 140 mmol/L (ref 135–145)

## 2022-02-28 MED ORDER — ROSUVASTATIN CALCIUM 20 MG PO TABS
20.0000 mg | ORAL_TABLET | Freq: Every day | ORAL | 0 refills | Status: DC
  Filled 2022-02-28: qty 90, 90d supply, fill #0

## 2022-02-28 MED ORDER — PANTOPRAZOLE SODIUM 40 MG PO TBEC
40.0000 mg | DELAYED_RELEASE_TABLET | Freq: Every day | ORAL | 0 refills | Status: AC
  Filled 2022-02-28: qty 90, 90d supply, fill #0

## 2022-02-28 MED ORDER — AMLODIPINE BESYLATE 2.5 MG PO TABS
2.5000 mg | ORAL_TABLET | Freq: Every day | ORAL | 0 refills | Status: DC
  Filled 2022-02-28: qty 90, 90d supply, fill #0

## 2022-02-28 MED ORDER — AMLODIPINE BESYLATE 2.5 MG PO TABS
2.5000 mg | ORAL_TABLET | Freq: Every day | ORAL | Status: DC
  Administered 2022-02-28: 2.5 mg via ORAL
  Filled 2022-02-28: qty 1

## 2022-02-28 MED ORDER — ASPIRIN 81 MG PO TBEC
81.0000 mg | DELAYED_RELEASE_TABLET | Freq: Every day | ORAL | 2 refills | Status: DC
  Filled 2022-02-28: qty 30, 30d supply, fill #0

## 2022-02-28 MED ORDER — CLOPIDOGREL BISULFATE 75 MG PO TABS
75.0000 mg | ORAL_TABLET | Freq: Every day | ORAL | 2 refills | Status: DC
  Filled 2022-02-28: qty 90, 90d supply, fill #0

## 2022-02-28 NOTE — Telephone Encounter (Signed)
**Note De-identified Zira Helinski Obfuscation** -----  **Note De-Identified Yahye Siebert Obfuscation** Message from Cheryln Manly, NP sent at 02/28/2022 11:33 AM EDT ----- Regarding: TOC call Needs TOC call

## 2022-02-28 NOTE — TOC Transition Note (Signed)
Transition of Care [facility]) - CM/SW Discharge Note   Patient Details  Name: Nathaniel [name] MRN: [id number] Date of Birth: [birth year]  [facility] CM/SW Contact:  Angelita Ingles, RN Phone [phone]  02/28/2022, 12:49 PM   Clinical Narrative:    Patient with discharge orders. No TOC needs noted.          Patient Goals and CMS Choice        Discharge Placement                       Discharge Plan and Services                                     Social Determinants of Health (SDOH) Interventions     Readmission Risk Interventions     No data to display

## 2022-02-28 NOTE — Discharge Summary (Signed)
Discharge Summary    Patient ID: Nathaniel [name] MRN: [id number]; DOB: 1945-07-11  Admit date: 02/26/2022 Discharge date: 02/28/2022  PCP:  Charlane Ferretti, MD   [facility] Cardiologist:  Sherren Mocha, MD     Discharge Diagnoses    Principal Problem:   NSTEMI (non-ST elevated myocardial infarction) [facility]) Active Problems:   Hyperlipidemia   Diagnostic Studies/Procedures    Cath: 02/27/22  1.  Patent left main with no stenosis 2.  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  Severe stenosis of the first diagonal subbranch, treated with PTCA, reducing 95% stenosis to 30% with TIMI-3 flow at the completion of the procedure and no evidence of dissection 4.  Patent left circumflex with no stenosis 5.  Patent RCA (dominant vessel) with no significant stenosis   Recommend: Continued aggressive medical therapy.  DAPT with aspirin and clopidogrel x12 months without interruption following non-STEMI.  Consider Pharm.D. clinic referral for PCSK9 inhibition to push LDL cholesterol to 55 or less  Diagnostic Dominance: Right  Intervention    Echo: 02/27/22  IMPRESSIONS     1. Prominent false tendon in the LV apex of no clinical significance..  Left ventricular ejection fraction, by estimation, is 60 to 65%. The left  ventricle has normal function. The left ventricle demonstrates regional  wall motion abnormalities (see  scoring diagram/findings for description). Left ventricular diastolic  parameters are consistent with Grade I diastolic dysfunction (impaired  relaxation). Possible very small focal area of hypokinesis of the left  ventricular, apical segment but no  visualized in all views.   2. Right ventricular systolic function is normal. The right ventricular  size is normal. There is normal pulmonary artery systolic pressure. The  estimated right ventricular systolic pressure is 89.2  mmHg.   3. The mitral valve is normal in structure. Trivial mitral valve  regurgitation. No evidence of mitral stenosis.   4. The aortic valve is tricuspid. Aortic valve regurgitation is mild.  Aortic valve sclerosis/calcification is present, without any evidence of  aortic stenosis.   5. Aortic dilatation noted. There is borderline dilatation of the aortic  root, measuring 37 mm. There is mild dilatation of the ascending aorta,  measuring 38 mm.   6. The inferior vena cava is normal in size with greater than 50%  respiratory variability, suggesting right atrial pressure of 3 mmHg.   7. Recommend repeat limited study with definity contrast.   FINDINGS   Left Ventricle: Prominent false tendon in the LV apex of no clinical  significance. Left ventricular ejection fraction, by estimation, is 60 to  65%. The left ventricle has normal function. The left ventricle  demonstrates regional wall motion  abnormalities. The left ventricular internal cavity size was normal in  size. There is no left ventricular hypertrophy. Left ventricular diastolic  parameters are consistent with Grade I diastolic dysfunction (impaired  relaxation). Normal left ventricular  filling pressure.   Right Ventricle: The right ventricular size is normal. No increase in  right ventricular wall thickness. Right ventricular systolic function is  normal. There is normal pulmonary artery systolic pressure. The tricuspid  regurgitant velocity is 2.35 m/s, and   with an assumed right atrial pressure of 3 mmHg, the estimated right  ventricular systolic pressure is 11.9 mmHg.   Left Atrium: Left atrial size was normal in size.   Right Atrium: Right atrial size was normal in size.   Pericardium: There is  no evidence of pericardial effusion.   Mitral Valve: The mitral valve is normal in structure. Trivial mitral  valve regurgitation. No evidence of mitral valve stenosis.   Tricuspid Valve: The tricuspid valve is normal in  structure. Tricuspid  valve regurgitation is mild . No evidence of tricuspid stenosis.   Aortic Valve: The aortic valve is tricuspid. Aortic valve regurgitation is  mild. Aortic valve sclerosis/calcification is present, without any  evidence of aortic stenosis.   Pulmonic Valve: The pulmonic valve was normal in structure. Pulmonic valve  regurgitation is trivial. No evidence of pulmonic stenosis.   Aorta: Aortic dilatation noted. There is borderline dilatation of the  aortic root, measuring 37 mm. There is mild dilatation of the ascending  aorta, measuring 38 mm.   Venous: The inferior vena cava is normal in size with greater than 50%  respiratory variability, suggesting right atrial pressure of 3 mmHg.   IAS/Shunts: No atrial level shunt detected by color flow Doppler.  _____________   History of Present Illness     Nathaniel [name] is a 76 y.o. male with a history of microvascular angina who was seen 02/26/2022 for the evaluation of non-STEMI.  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  He had 3 cardiac catheterizations in the early 2000's, including evaluation with intravascular ultrasound, that failed to identify a structural cause for his symptoms.  Despite this he had very typical symptoms of stable angina pectoris with a predictable threshold of exertion that would bring on symptoms such as walking from the parking lot at a brisk pace or climbing stairs fast.   Over the last few months his exertional angina had actually abated, until mid August when he began experiencing very similar complaints at rest.  He describes left sided chest discomfort in the precordial area, a sensation of deep pressure or expansion, sometimes radiating to his left shoulder and left arm.  The symptoms would resolve spontaneously.  He avoids taking nitroglycerin since this causes severe headaches.   Today he had a prolonged episode of similar chest discomfort that occurred while he was standing in [place] for the [employer].  Symptoms waxed and waned for at least 4 hours.  He did not have any significant shortness of breath, dizziness, palpitations presyncope or syncope.  He denies exertional dyspnea, orthopnea or PND.  He has not had any permanent focal neurological events, but had 2 ocular migraines just in the last couple of days.   He is currently asymptomatic.  He is receiving intravenous heparin.   He has chronic sinus bradycardia but has never experienced syncope or met criteria for pacemaker implantation.   Extensive past work-up with echo, stress echo cardiopulmonary exercise tests and coronary CT angiography have generally shown normal or low risk findings.  Most recently underwent a coronary CT angiogram in March 2023 showing minor obstructive lesions (stenosis 25-49%) in the proximal LAD, mid LAD, proximal left circumflex coronary artery as well as a mild calcified plaque in the proximal left main with a stenosis of 25-49%.  His calcium score is actually less than average (149, 38th percentile).  His calcium score has increased from 94 years earlier, but remains in the 38th percentile for age and gender.  He is known to have a small intramyocardial segment of the proximal LAD artery, also confirmed on previous CT.   Most recent assessment of LV function was echocardiogram from November 2022  with EF 55-60% and mitral inflow consistent with "impaired laxation".  There is mild mitral insufficiency and mild aortic insufficiency.  He does not have left ventricular hypertrophy.   He has had some unusual muscular complaints this year and briefly interrupted treatment with atorvastatin without any improvement in his symptoms.  He is currently taking rosuvastatin 10 mg daily for the last several months.  A very recent lipid profile shows an LDL cholesterol of 80 and HDL of 38.  He  does not have diabetes mellitus and is a non-smoker.  Although there is a family history of CAD, this was not early onset.  His blood pressure is uncharacteristically high today, typically at home his blood pressure was in the 120-140/60-70 range.   Current noncardiac problems include worsening symptoms of BPH with urinary frequency, degenerative lumbar spine disease.   Hospital Course     NSTEMI -- High-sensitivity troponin peaked at greater than 3000.  Underwent cardiac catheterization noted above with patent left main, patent LAD with intra myocardial bridging of the proximal to mid vessel.  New severe stenosis of first diagonal subbranch treated with PTCA reducing stenosis to 30%.  Recommendations for DAPT with aspirin/Plavix for at least 1 year.   -- Echo showed a small area of possible apical lateral hypocontractility most likely due to this subbranch of the diagonal vessel supplying this territory -- Continue aspirin, Plavix, Crestor 20 mg daily, amlodipine 2.5 mg daily  Hyperlipidemia -- LDL cholesterol 79 -- Rosuvastatin was increased to 20 mg this admission -- We will need LFT/FLP in 8 weeks.  Consider outpatient referral to lipid clinic for consideration of PCSK9i.   Mild aortic regurgitation on echocardiography  Patient was seen by Dr. Claiborne Billings and deemed stable for discharge home. Follow up in the office has been arranged. Medications sent to the [facility] pharmacy  Did the patient have an acute coronary syndrome (MI, NSTEMI, STEMI, etc) this admission?:  Yes                               AHA/ACC Clinical Performance & Quality Measures: Aspirin prescribed? - Yes ADP Receptor Inhibitor (Plavix/Clopidogrel, Brilinta/Ticagrelor or Effient/Prasugrel) prescribed (includes medically managed patients)? - Yes Beta Blocker prescribed? - No - bradycardia High Intensity Statin (Lipitor 40-31m or Crestor 20-45m prescribed? - Yes EF assessed during THIS hospitalization? - Yes For EF <40%, was  ACEI/ARB prescribed? - Not Applicable (EF >/= 4049%For EF <40%, Aldosterone Antagonist (Spironolactone or Eplerenone) prescribed? - Not Applicable (EF >/= 4017%Cardiac Rehab Phase II ordered (including medically managed patients)? - Yes       The patient will be scheduled for a TOC follow up appointment in 10-14 days.  A message has been sent to the [facility]uling Pool at the office where the patient should be seen for follow up.  _____________  Discharge Vitals Blood pressure 135/69, pulse (!) 41, temperature 97.6 F (36.4 C), temperature source Oral, resp. rate 16, height '5\' 6"'  (1.676 m), weight 74.1 kg, SpO2 96 %.  Filed Weights   02/26/22 1626 02/27/22 0117  Weight: 74.8 kg 74.1 kg    Labs & Radiologic Studies    CBC Recent Labs    02/27/22 0129 02/28/22 0631  WBC 5.2 3.7*  HGB 11.7* 11.7*  HCT 34.1* 35.2*  MCV 89.0 90.0  PLT 180 17915 Basic Metabolic Panel Recent Labs    02/26/22 1519 02/28/22 0631  NA 139 140  K 3.7 4.0  CL 107 110  CO2 25 26  GLUCOSE 107* 115*  BUN 18 15  CREATININE 1.28* 1.23  CALCIUM 9.1 8.8*   Liver Function Tests Recent Labs    02/26/22 1643  AST 26  ALT 18  ALKPHOS 45  BILITOT 0.8  PROT 6.3*  ALBUMIN 4.1   No results for input(s): "LIPASE", "AMYLASE" in the last 72 hours. High Sensitivity Troponin:   Recent Labs  Lab 02/26/22 1519 02/26/22 1643  TROPONINIHS 1,632* 3,332*    BNP Invalid input(s): "POCBNP" D-Dimer No results for input(s): "DDIMER" in the last 72 hours. Hemoglobin A1C No results for input(s): "HGBA1C" in the last 72 hours. Fasting Lipid Panel Recent Labs    02/27/22 0129  CHOL 140  HDL 34*  LDLCALC 79  TRIG 136  CHOLHDL 4.1   Thyroid Function Tests Recent Labs    02/26/22 1643  TSH 1.481   _____________  CARDIAC CATHETERIZATION  Result Date: 02/27/2022 1.  Patent left main with no stenosis 2.  Patent LAD with intramyocardial bridging of the proximal to mid vessel, stable from  previous cath studies 3.  Severe stenosis of the first diagonal subbranch, treated with PTCA, reducing 95% stenosis to 30% with TIMI-3 flow at the completion of the procedure and no evidence of dissection 4.  Patent left circumflex with no stenosis 5.  Patent RCA (dominant vessel) with no significant stenosis Recommend: Continued aggressive medical therapy.  DAPT with aspirin and clopidogrel x12 months without interruption following non-STEMI.  Consider Pharm.D. clinic referral for PCSK9 inhibition to push LDL cholesterol to 55 or less   ECHOCARDIOGRAM COMPLETE  Result Date: 02/27/2022    ECHOCARDIOGRAM REPORT   Patient Name:   Nathaniel [name] Date of Exam: 02/27/2022 Medical Rec #:  [id number]             Height:       66.0 in Accession #:    [id number]            Weight:       163.4 lb Date of Birth:  1945-07-14             BSA:          1.835 m Patient Age:    31 years              BP:           130/61 mmHg Patient Gender: M                     HR:           42 bpm. Exam Location:  Inpatient Procedure: 2D Echo, Color Doppler and Cardiac Doppler Indications:    Acute MI i21.9  History:        Patient has prior history of Echocardiogram examinations, most                 recent 04/25/2021. CAD; Risk Factors:Dyslipidemia.  Sonographer:    Raquel Sarna Senior RDCS Sonographer#2:  Ronny Flurry Referring Phys: [phone] [place]  1. Prominent false tendon in the LV apex of no clinical significance.. Left ventricular ejection fraction, by estimation, is 60 to 65%. The left ventricle has normal function. The left ventricle demonstrates regional wall motion abnormalities (see scoring diagram/findings for description). Left ventricular diastolic parameters are consistent with Grade I diastolic dysfunction (impaired relaxation). Possible very small focal area of hypokinesis of the left ventricular, apical segment but no visualized in  all views.  2. Right ventricular systolic function is normal. The right  ventricular size is normal. There is normal pulmonary artery systolic pressure. The estimated right ventricular systolic pressure is 40.9 mmHg.  3. The mitral valve is normal in structure. Trivial mitral valve regurgitation. No evidence of mitral stenosis.  4. The aortic valve is tricuspid. Aortic valve regurgitation is mild. Aortic valve sclerosis/calcification is present, without any evidence of aortic stenosis.  5. Aortic dilatation noted. There is borderline dilatation of the aortic root, measuring 37 mm. There is mild dilatation of the ascending aorta, measuring 38 mm.  6. The inferior vena cava is normal in size with greater than 50% respiratory variability, suggesting right atrial pressure of 3 mmHg.  7. Recommend repeat limited study with definity contrast. FINDINGS  Left Ventricle: Prominent false tendon in the LV apex of no clinical significance. Left ventricular ejection fraction, by estimation, is 60 to 65%. The left ventricle has normal function. The left ventricle demonstrates regional wall motion abnormalities. The left ventricular internal cavity size was normal in size. There is no left ventricular hypertrophy. Left ventricular diastolic parameters are consistent with Grade I diastolic dysfunction (impaired relaxation). Normal left ventricular filling pressure. Right Ventricle: The right ventricular size is normal. No increase in right ventricular wall thickness. Right ventricular systolic function is normal. There is normal pulmonary artery systolic pressure. The tricuspid regurgitant velocity is 2.35 m/s, and  with an assumed right atrial pressure of 3 mmHg, the estimated right ventricular systolic pressure is 81.1 mmHg. Left Atrium: Left atrial size was normal in size. Right Atrium: Right atrial size was normal in size. Pericardium: There is no evidence of pericardial effusion. Mitral Valve: The mitral valve is normal in structure. Trivial mitral valve regurgitation. No evidence of mitral valve  stenosis. Tricuspid Valve: The tricuspid valve is normal in structure. Tricuspid valve regurgitation is mild . No evidence of tricuspid stenosis. Aortic Valve: The aortic valve is tricuspid. Aortic valve regurgitation is mild. Aortic valve sclerosis/calcification is present, without any evidence of aortic stenosis. Pulmonic Valve: The pulmonic valve was normal in structure. Pulmonic valve regurgitation is trivial. No evidence of pulmonic stenosis. Aorta: Aortic dilatation noted. There is borderline dilatation of the aortic root, measuring 37 mm. There is mild dilatation of the ascending aorta, measuring 38 mm. Venous: The inferior vena cava is normal in size with greater than 50% respiratory variability, suggesting right atrial pressure of 3 mmHg. IAS/Shunts: No atrial level shunt detected by color flow Doppler.  LEFT VENTRICLE PLAX 2D LVIDd:         4.70 cm   Diastology LVIDs:         3.40 cm   LV e' medial:    4.46 cm/s LV PW:         1.10 cm   LV E/e' medial:  12.5 LV IVS:        1.10 cm   LV e' lateral:   7.29 cm/s LVOT diam:     2.50 cm   LV E/e' lateral: 7.7 LV SV:         127 LV SV Index:   69 LVOT Area:     4.91 cm  RIGHT VENTRICLE RV S prime:     10.00 cm/s TAPSE (M-mode): 2.6 cm LEFT ATRIUM             Index        RIGHT ATRIUM           Index LA diam:  2.90 cm 1.58 cm/m   RA Area:     20.30 cm LA Vol (A2C):   46.7 ml 25.45 ml/m  RA Volume:   57.90 ml  31.55 ml/m LA Vol (A4C):   38.7 ml 21.09 ml/m LA Biplane Vol: 45.9 ml 25.01 ml/m  AORTIC VALVE LVOT Vmax:         101.00 cm/s LVOT Vmean:        64.200 cm/s LVOT VTI:          0.259 m AR Vena Contracta: 0.30 cm  AORTA Ao Root diam: 3.70 cm Ao Asc diam:  3.80 cm MITRAL VALVE               TRICUSPID VALVE MV Area (PHT): 1.22 cm    TR Peak grad:   22.1 mmHg MV Decel Time: 624 msec    TR Vmax:        235.00 cm/s MV E velocity: 55.80 cm/s MV A velocity: 76.10 cm/s  SHUNTS MV E/A ratio:  0.73        Systemic VTI:  0.26 m                             Systemic Diam: 2.50 cm Fransico Him MD Electronically signed by Fransico Him MD Signature Date/Time: 02/27/2022/10:52:52 AM    Final    DG Chest 2 View  Result Date: 02/26/2022 CLINICAL DATA:  Sudden onset of intermittent chest pain since this morning EXAM: CHEST - 2 VIEW COMPARISON:  06/17/2021 FINDINGS: Frontal and lateral views of the chest demonstrate an unremarkable cardiac silhouette. No acute airspace disease, effusion, or pneumothorax. No acute bony abnormalities. IMPRESSION: 1. Stable chest, no acute process. Electronically Signed   By: Randa Ngo M.D.   On: 02/26/2022 15:37    Disposition   Pt is being discharged home today in good condition.  Follow-up Plans & Appointments     Follow-up Information     Emmaline Life, NP Follow up on 03/08/2022.   Specialty: Nurse Practitioner Why: at 2:20pm for your follow up appt with Dr. York Cerise' NP Natasha Mead information: [address] [place] [address] [phone]                Discharge Instructions     Amb Referral to Cardiac Rehabilitation   Complete by: As directed    Diagnosis: NSTEMI   After initial evaluation and assessments completed: Virtual Based Care may be provided alone or in conjunction with Phase 2 Cardiac Rehab based on patient barriers.: Yes   Intensive Cardiac Rehabilitation ([place]) [place] location only OR Traditional Cardiac Rehabilitation (TCR) *If criteria for ICR are not met will enroll in TCR [facility] only): Yes   Call MD for:  difficulty breathing, headache or visual disturbances   Complete by: As directed    Call MD for:  redness, tenderness, or signs of infection (pain, swelling, redness, odor or green/yellow discharge around incision site)   Complete by: As directed    Diet - low sodium heart healthy   Complete by: As directed    Discharge instructions   Complete by: As directed    Radial Site Care Refer to this sheet in the next few weeks. These instructions provide you  with information on caring for yourself after your procedure. Your caregiver may also give you more specific instructions. Your treatment has been planned according to current medical practices, but problems sometimes occur. Call your  caregiver if you have any problems or questions after your procedure. HOME CARE INSTRUCTIONS You may shower the day after the procedure. Remove the bandage (dressing) and gently wash the site with plain soap and water. Gently pat the site dry.  Do not apply powder or lotion to the site.  Do not submerge the affected site in water for 3 to 5 days.  Inspect the site at least twice daily.  Do not flex or bend the affected arm for 24 hours.  No lifting over 5 pounds (2.3 kg) for 5 days after your procedure.  Do not drive home if you are discharged the same day of the procedure. Have someone else drive you.  You may drive 24 hours after the procedure unless otherwise instructed by your caregiver.  What to expect: Any bruising will usually fade within 1 to 2 weeks.  Blood that collects in the tissue (hematoma) may be painful to the touch. It should usually decrease in size and tenderness within 1 to 2 weeks.  SEEK IMMEDIATE MEDICAL CARE IF: You have unusual pain at the radial site.  You have redness, warmth, swelling, or pain at the radial site.  You have drainage (other than a small amount of blood on the dressing).  You have chills.  You have a fever or persistent symptoms for more than 72 hours.  You have a fever and your symptoms suddenly get worse.  Your arm becomes pale, cool, tingly, or numb.  You have heavy bleeding from the site. Hold pressure on the site.   PLEASE DO NOT MISS ANY DOSES OF YOUR PLAVIX!!!!! Also keep a log of you blood pressures and bring back to your follow up appt. Please call the office with any questions.   Patients taking blood thinners should generally stay away from medicines like ibuprofen, Advil, Motrin, naproxen, and Aleve due to  risk of stomach bleeding. You may take Tylenol as directed or talk to your primary doctor about alternatives.  Some studies suggest Prilosec/Omeprazole interacts with Plavix. We changed your Prilosec/Omeprazole to the equivalent dose of Protonix for less chance of interaction.  PLEASE ENSURE THAT YOU DO NOT RUN OUT OF YOUR PLAVIX. This medication is very important to remain on for at least one year. IF you have issues obtaining this medication due to cost please CALL the office 3-5 business days prior to running out in order to prevent missing doses of this medication.   Increase activity slowly   Complete by: As directed        Discharge Medications   Allergies as of 02/28/2022       Reactions   Penicillins Anaphylaxis, Other (See Comments), Hives   Hands swell, throat swells Hands swell, throat swells No reaction listed Hives (pt clarified at office visit)   Fentanyl Nausea And Vomiting, Other (See Comments)   Imdur [isosorbide Dinitrate] Other (See Comments)   Migraine headache        Medication List     STOP taking these medications    esomeprazole 40 MG capsule Commonly known as: NEXIUM   ZEGERID PO       TAKE these medications    amLODipine 2.5 MG tablet Commonly known as: NORVASC Take 1 tablet (2.5 mg total) by mouth daily. Start taking on: March 01, 2022   aspirin EC 81 MG tablet Take 1 tablet (81 mg total) by mouth daily. Swallow whole. Start taking on: March 01, 2022   azelastine 0.1 % nasal spray Commonly known as: ASTELIN  Place 1-2 sprays into both nostrils 2 (two) times daily as needed for rhinitis or allergies.   cholecalciferol 1000 units tablet Commonly known as: VITAMIN D Take 1,000 Units by mouth every morning.   ciclopirox 0.77 % cream Commonly known as: LOPROX Apply 1 Application topically 2 (two) times daily as needed (rash).   clopidogrel 75 MG tablet Commonly known as: PLAVIX Take 1 tablet (75 mg total) by mouth daily  with breakfast. Start taking on: March 01, 2022   desoximetasone 0.25 % cream Commonly known as: TOPICORT Apply 1 Application topically daily as needed (itching on the back).   Magnesium 250 MG Tabs Take 500 mg by mouth at bedtime.   mupirocin cream 2 % Commonly known as: BACTROBAN Apply 1 Application topically 2 (two) times daily as needed (rash).   pantoprazole 40 MG tablet Commonly known as: PROTONIX Take 1 tablet (40 mg total) by mouth daily at 10 pm.   rosuvastatin 20 MG tablet Commonly known as: CRESTOR Take 1 tablet (20 mg total) by mouth daily at 10 pm. What changed:  medication strength how much to take when to take this   senna 8.6 MG tablet Commonly known as: SENOKOT Take 1.5 tablets by mouth 2 (two) times daily.   tamsulosin 0.4 MG Caps capsule Commonly known as: FLOMAX Take 0.4 mg by mouth at bedtime.   Vitamin B-12 2500 MCG Subl Place 1 tablet under the tongue every Sunday.        Outstanding Labs/Studies   FLP/LFTs in 8 weeks   Duration of Discharge Encounter   Greater than 30 minutes including physician time.  Signed, Reino Bellis, NP 02/28/2022, 11:40 AM

## 2022-02-28 NOTE — Progress Notes (Addendum)
CARDIAC REHAB PHASE I   PRE:  Rate/Rhythm: 71 SB  BP:  Sitting: 134/66      SaO2: RA  MODE:  Ambulation: 340 ft   POST:  Rate/Rhythm: 50 SB  BP:  Sitting: 124/65      SaO2: RA   Pt was agreeable to ambulation with standby assistance, pt asymptomatic during ambulation and returned to room. Pt educated on MI through MI book, ex guidelines, heart healthy diet, restrictions, and CRPII. Pt is semi-interested in [place] and would like to wait before committing to program. Will reach out to cardiologist when ready to join program.   Nathaniel [name]  10:10 AM 02/28/2022

## 2022-02-28 NOTE — Progress Notes (Signed)
Rounding Note    Patient Name: Nathaniel [name] Date of Encounter: 02/28/2022  [place] Cardiologist: Sherren Mocha, MD   Subjective   No recurrent chest pain; had approximately 4-1/2 hours of waxing and waning chest tightness during the Jewish holiday celebration on [place]26/22 [place] [employer].   Inpatient Medications    Scheduled Meds:  aspirin EC  81 mg Oral Daily   clopidogrel  75 mg Oral Q breakfast   vitamin B-12  2,500 mcg Oral Once per day on Mon Wed Fri   magnesium oxide  400 mg Oral QHS   pantoprazole  40 mg Oral Q2200   rosuvastatin  20 mg Oral Q2200   senna-docusate  2 tablet Oral BID   sodium chloride flush  3 mL Intravenous Q12H   tamsulosin  0.4 mg Oral Q2200   Continuous Infusions:  sodium chloride     PRN Meds: sodium chloride, acetaminophen, ondansetron (ZOFRAN) IV, mouth rinse, sodium chloride flush   Vital Signs    Vitals:   02/27/22 1830 02/27/22 2011 02/27/22 2343 02/28/22 0315  BP: (!) 148/62 127/61 103/67 135/69  Pulse:  (!) 37 70 (!) 41  Resp: '14 17 16 16  '$ Temp:  97.8 F (36.6 C) (!) 97.4 F (36.3 C) 97.6 F (36.4 C)  TempSrc:  Oral Oral Oral  SpO2:   95% 96%  Weight:      Height:        Intake/Output Summary (Last 24 hours) at 02/28/2022 0840 Last data filed at 02/28/2022 0700 Gross per 24 hour  Intake 666.9 ml  Output 1050 ml  Net -383.1 ml   I/O since admission: -442      02/27/2022    1:17 AM 02/26/2022    4:26 PM 01/18/2022    4:15 PM  Last 3 Weights  Weight (lbs) 163 lb 5.8 oz 165 lb 167 lb  Weight (kg) 74.1 kg 74.844 kg 75.751 kg      Telemetry    Sinus bradycardia at 44.  No ectopy - Personally Reviewed  ECG    02/28/2022: ECG (independently read by me): Marked sinus bradycardia at 39, 1st degree AV block; PR 242 msec, ST abnormality 3,aVF, V5-6  ECG (independently read by me): Marked sinus bradycardia at 37 bpm.  First-degree AV block with PR interval 240 ms.  LVH.  Physical Exam    BP  135/69 (BP Location: Left Arm)   Pulse (!) 41   Temp 97.6 F (36.4 C) (Oral)   Resp 16   Ht '5\' 6"'$  (1.676 m)   Wt 74.1 kg   SpO2 96%   BMI 26.37 kg/m  General: Alert, oriented, no distress.  Skin: normal turgor, no rashes, warm and dry HEENT: Normocephalic, atraumatic. Pupils equal round and reactive to light; sclera anicteric; extraocular muscles intact;  Nose without nasal septal hypertrophy Mouth/Parynx benign; Mallinpatti scale Neck: No JVD, no carotid bruits; normal carotid upstroke Lungs: clear to ausculatation and percussion; no wheezing or rales Chest wall: without tenderness to palpitation Heart: PMI not displaced, RRR, s1 s2 normal, trivial systolic murmur, faint diastolic murmur, no rubs, gallops, thrills, or heaves Abdomen: soft, nontender; no hepatosplenomehaly, BS+; abdominal aorta nontender and not dilated by palpation. Back: no CVA tenderness Pulses 2+ R radial site stable Musculoskeletal: full range of motion, normal strength, no joint deformities Extremities: no clubbing cyanosis or edema, Homan's sign negative  Neurologic: grossly nonfocal; Cranial nerves grossly wnl Psychologic: Normal mood and affect     Labs  High Sensitivity Troponin:   Recent Labs  Lab 02/26/22 1519 02/26/22 1643  TROPONINIHS 1,632* 3,332*     Chemistry Recent Labs  Lab 02/26/22 1519 02/26/22 1643 02/28/22 0631  NA 139  --  140  K 3.7  --  4.0  CL 107  --  110  CO2 25  --  26  GLUCOSE 107*  --  115*  BUN 18  --  15  CREATININE 1.28*  --  1.23  CALCIUM 9.1  --  8.8*  PROT  --  6.3*  --   ALBUMIN  --  4.1  --   AST  --  26  --   ALT  --  18  --   ALKPHOS  --  45  --   BILITOT  --  0.8  --   GFRNONAA 58*  --  >60  ANIONGAP 7  --  4*    Lipids  Recent Labs  Lab 02/27/22 0129  CHOL 140  TRIG 136  HDL 34*  LDLCALC 79  CHOLHDL 4.1    Hematology Recent Labs  Lab 02/26/22 1519 02/27/22 0129 02/28/22 0631  WBC 5.0 5.2 3.7*  RBC 4.17* 3.83* 3.91*  HGB 12.6*  11.7* 11.7*  HCT 37.6* 34.1* 35.2*  MCV 90.2 89.0 90.0  MCH 30.2 30.5 29.9  MCHC 33.5 34.3 33.2  RDW 13.4 13.4 13.6  PLT 189 180 175   Thyroid  Recent Labs  Lab 02/26/22 1643  TSH 1.481    BNPNo results for input(s): "BNP", "PROBNP" in the last 168 hours.  DDimer No results for input(s): "DDIMER" in the last 168 hours.   Radiology    CARDIAC CATHETERIZATION  Result Date: 02/27/2022 1.  Patent left main with no stenosis 2.  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  Severe stenosis of the first diagonal subbranch, treated with PTCA, reducing 95% stenosis to 30% with TIMI-3 flow at the completion of the procedure and no evidence of dissection 4.  Patent left circumflex with no stenosis 5.  Patent RCA (dominant vessel) with no significant stenosis Recommend: Continued aggressive medical therapy.  DAPT with aspirin and clopidogrel x12 months without interruption following non-STEMI.  Consider Pharm.D. 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 Exam Location:  Inpatient Procedure: 2D Echo, Color Doppler and Cardiac Doppler Indications:    Acute MI i21.9  History:  Patient has prior history of Echocardiogram examinations, most                 recent 04/25/2021. CAD; Risk Factors:Dyslipidemia.  Sonographer:    Raquel Sarna Senior RDCS Sonographer#2:  Ronny Flurry Referring Phys: [phone] [place]  1. Prominent false tendon in the LV apex of no  clinical significance.. Left ventricular ejection fraction, by estimation, is 60 to 65%. The left ventricle has normal function. The left ventricle demonstrates regional wall motion abnormalities (see scoring diagram/findings for description). Left ventricular diastolic parameters are consistent with Grade I diastolic dysfunction (impaired relaxation). Possible very small focal area of hypokinesis of the left ventricular, apical segment but no visualized in all views.  2. Right ventricular systolic function is normal. The right ventricular size is normal. There is normal pulmonary artery systolic pressure. The estimated right ventricular systolic pressure is 76.1 mmHg.  3. The mitral valve is normal in structure. Trivial mitral valve regurgitation. No evidence of mitral stenosis.  4. The aortic valve is tricuspid. Aortic valve regurgitation is mild. Aortic valve sclerosis/calcification is present, without any evidence of aortic stenosis.  5. Aortic dilatation noted. There is borderline dilatation of the aortic root, measuring 37 mm. There is mild dilatation of the ascending aorta, measuring 38 mm.  6. The inferior vena cava is normal in size with greater than 50% respiratory variability, suggesting right atrial pressure of 3 mmHg.  7. Recommend repeat limited study with definity contrast. FINDINGS  Left Ventricle: Prominent false tendon in the LV apex of no clinical significance. Left ventricular ejection fraction, by estimation, is 60 to 65%. The left ventricle has normal function. The left ventricle demonstrates regional wall motion abnormalities. The left ventricular internal cavity size was normal in size. There is no left ventricular hypertrophy. Left ventricular diastolic parameters are consistent with Grade I diastolic dysfunction (impaired relaxation). Normal left ventricular filling pressure. Right Ventricle: The right ventricular size is normal. No increase in right ventricular wall thickness. Right  ventricular systolic function is normal. There is normal pulmonary artery systolic pressure. The tricuspid regurgitant velocity is 2.35 m/s, and  with an assumed right atrial pressure of 3 mmHg, the estimated right ventricular systolic pressure is 95.0 mmHg. Left Atrium: Left atrial size was normal in size. Right Atrium: Right atrial size was normal in size. Pericardium: There is no evidence of pericardial effusion. Mitral Valve: The mitral valve is normal in structure. Trivial mitral valve regurgitation. No evidence of mitral valve stenosis. Tricuspid Valve: The tricuspid valve is normal in structure. Tricuspid valve regurgitation is mild . No evidence of tricuspid stenosis. Aortic Valve: The aortic valve is tricuspid. Aortic valve regurgitation is mild. Aortic valve sclerosis/calcification is present, without any evidence of aortic stenosis. Pulmonic Valve: The pulmonic valve was normal in structure. Pulmonic valve regurgitation is trivial. No evidence of pulmonic stenosis. Aorta: Aortic dilatation noted. There is borderline dilatation of the aortic root, measuring 37 mm. There is mild dilatation of the ascending aorta, measuring 38 mm. Venous: The inferior vena cava is normal in size with greater than 50% respiratory variability, suggesting right atrial pressure of 3 mmHg. IAS/Shunts: No atrial level shunt detected by color flow Doppler.  LEFT VENTRICLE PLAX 2D LVIDd:         4.70 cm   Diastology LVIDs:         3.40 cm   LV e' medial:    4.46 cm/s LV PW:         1.10 cm  LV E/e' medial:  12.5 LV IVS:        1.10 cm   LV e' lateral:   7.29 cm/s LVOT diam:     2.50 cm   LV E/e' lateral: 7.7 LV SV:         127 LV SV Index:   69 LVOT Area:     4.91 cm  RIGHT VENTRICLE RV S prime:     10.00 cm/s TAPSE (M-mode): 2.6 cm LEFT ATRIUM             Index        RIGHT ATRIUM           Index LA diam:        2.90 cm 1.58 cm/m   RA Area:     20.30 cm LA Vol (A2C):   46.7 ml 25.45 ml/m  RA Volume:   57.90 ml  31.55 ml/m LA  Vol (A4C):   38.7 ml 21.09 ml/m LA Biplane Vol: 45.9 ml 25.01 ml/m  AORTIC VALVE LVOT Vmax:         101.00 cm/s LVOT Vmean:        64.200 cm/s LVOT VTI:          0.259 m AR Vena Contracta: 0.30 cm  AORTA Ao Root diam: 3.70 cm Ao Asc diam:  3.80 cm MITRAL VALVE               TRICUSPID VALVE MV Area (PHT): 1.22 cm    TR Peak grad:   22.1 mmHg MV Decel Time: 624 msec    TR Vmax:        235.00 cm/s MV E velocity: 55.80 cm/s MV A velocity: 76.10 cm/s  SHUNTS MV E/A ratio:  0.73        Systemic VTI:  0.26 m                            Systemic Diam: 2.50 cm Fransico Him MD Electronically signed by Fransico Him MD Signature Date/Time: 02/27/2022/10:52:52 AM    Final    DG Chest 2 View  Result Date: 02/26/2022 CLINICAL DATA:  Sudden onset of intermittent chest pain since this morning EXAM: CHEST - 2 VIEW COMPARISON:  06/17/2021 FINDINGS: Frontal and lateral views of the chest demonstrate an unremarkable cardiac silhouette. No acute airspace disease, effusion, or pneumothorax. No acute bony abnormalities. IMPRESSION: 1. Stable chest, no acute process. Electronically Signed   By: Randa Ngo M.D.   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  He underwent catheterization by Dr.  Vicenta Aly in May 2006 at which time he had a myocardial bridging in the  proximal LAD with compression to 40-50% and minimal plaque documented by  IVUS proximal to the area for systolic compression.  He subsequently had  evaluation by Dr. Jenne Pane at [facility] and had an exercise MRI which  showed no evidence of ischemia and specifically, no evidence of ischemia  in the distribution of the LAD.  Based on these findings, he is felt to  have microvascular angina.  Therapy has been limited by a slow heart  rate which has precluded beta blockers and an intolerance of  nitrates  due to headache and nonresponsiveness to calcium channel  blockers.  Recently his symptoms increased in severity and they occurred with very  minimal exertion.  We made a decision to reevaluate him with  angiography.  We considered CT angiography, but he was concerned about  the radiation so we decided on conventional angiography to rule out  development of obstructive coronary disease.    PROCEDURE:  The procedure was performed via the right femoral artery and  arterial sheath and 4-French preformed coronary catheters.  A front wall  arterial puncture was performed and Omnipaque contrast was used.  The  patient did have chest discomfort with the contrast injections.  The  nitroglycerin was given intracoronary to further evaluate the LAD  bridging.  The patient tolerated the procedure well and left the  laboratory in satisfactory condition.  He received 1 mg of Versed at the  beginning of the procedure and 2 mg of morphine at the end of the  procedure.    RESULTS:  Hemodynamic data:  The aortic pressure was 116/64 with a mean  of 86.  The left ventricular pressure is 116/13.    Left main coronary main free of significant disease.    Left anterior descending artery gave rise to a large diagonal branch  with two sub-branches and two septal perforators.  There was mild 40%  narrowing in one of the sub branches of the diagonal branch.  There was  30% narrowing in the proximal LAD.  There was systolic bridging in the  proximal LAD just distal to the mild plaque which narrows the lumen to  about 40% during systole.  The distal vessel is free of major  obstruction.    The circumflex artery gave rise to two marginal branches and a large and  small posterolateral branch.  These vessels were free of significant  disease.    The right coronary artery was a moderately large vessel, gave rise to a  conus branch, two right ventricular branches, a posterior descending  branch, and two  posterolateral branches.  This vessel is free of  significant disease but there was TIMI-2 flow in the vessel.  It took 4  cycles for the vessel to completely fill and it appeared that the  myocardial blush did not wash out rapidly.    The left ventriculography was done in the RAO projection and showed good  wall motion with no areas of hypokinesis.  Estimated ejection fraction  was 55%..    CONCLUSION:  Mild nonobstructive coronary artery disease with 30 cm  narrowing in the proximal left anterior descending, 94% systolic  compression in the proximal left anterior descending, and TIMI-2 flow in  the right coronary with normal left ventricular function.    RECOMMENDATIONS:  The patient has minimal nonobstructive disease and I  do not think the systolic bridging is responsible for his symptoms.  I  think Timmothy Sours meets all the criteria for the diagnosis of microvascular  angina syndrome X.  I will discuss further treatment with him.  We may  reconsider with Renexa although he has had some reluctance to try this  in the past.  Will also consider referral back to Dr. Jenne Pane at  [facility].     Coronary CT angiogram March 2023   Left main: The left main is a large caliber vessel with a normal take off from the left coronary cusp that bifurcates to form a left anterior descending artery and a left circumflex artery. There is mild calcified plaque in the proximal LM with associated  stenosis of 25-49%.   Left anterior descending artery: The LAD is patent without evidence of plaque or stenosis. The LAD gives off 1 large branching diagonal. There is mild calcified plaque in the proximal LAD with associated stenosis of 25-49%. There is mild soft plaque in the mid LAD with associated stenosis of 25-49%.   Left circumflex artery: The LCX is non-dominant. The LCX gives off 2 patent obtuse marginal branches. There is mild calcified plaque in the proximal LCx with associated stenosis  of 25-49%.   Right coronary artery: The RCA is dominant with normal take off from the right coronary cusp. There is no evidence of plaque or stenosis. The RCA terminates as a PDA and right posterolateral branch without evidence of plaque or stenosis.   Right Atrium: Right atrial size is within normal limits.   Right Ventricle: The right ventricular cavity is within normal limits.   Left Atrium: Left atrial size is normal in size with no left atrial appendage filling defect.   Left Ventricle: The ventricular cavity size is within normal limits. There are no stigmata of prior infarction. There is no abnormal filling defect.   Pulmonary arteries: Normal in size without proximal filling defect.   Pulmonary veins: Normal pulmonary venous drainage.   Pericardium: Normal thickness with no significant effusion or calcium present.   Cardiac valves: The aortic valve is trileaflet without significant calcification. The mitral valve is normal structure without significant calcification.   Aorta: Normal caliber with no significant disease.   Extra-cardiac findings: See attached radiology report for non-cardiac structures.   IMPRESSION: 1. Coronary calcium score of 149. This was 38th percentile for age-, sex, and race-matched controls.   2.  Normal coronary origin with right dominance.   3.  Mild atherosclerosis.  CAD RADS 2.   4.  Recommend preventive therapy and risk factor modification.   5.  Consider non atherosclerotic causes of chest pain.   14-day event monitor June 2023 SUMMARY: The basic rhythm is normal sinus with an average HR of 53 bpm No atrial fibrillation or flutter No high-grade heart block There are rare PVC's and rare supraventricular beats, both occurring less than 1% burden The sinus rate is as low as 34 bpm without AV block or junctional beats seen Rare supraventricular runs limited to 9 beats for the longest run   Echocardiogram November 2022  1. Left  ventricular ejection fraction, by estimation, is 55 to 60%. The  left ventricle has normal function. The left ventricle has no regional  wall motion abnormalities. Left ventricular diastolic parameters are  consistent with Grade I diastolic  dysfunction (impaired relaxation).   2. Right ventricular systolic function is normal. The right ventricular  size is normal. There is normal pulmonary artery systolic pressure. The  estimated right ventricular systolic pressure is 10.1 mmHg.   3. Moderate elongation of the mitral anterior and posterior leaflets. No  prolapse per current criteria noted.   4. The mitral valve is abnormal. trivial to mild mitral valve  regurgitation.   5. The aortic valve is tricuspid. Aortic valve regurgitation is mild.   6. The inferior vena cava is normal in size with greater than 50%  respiratory variability, suggesting right atrial pressure of 3 mmHg.   Comparison(s): Changes from prior study are noted. 05/01/2019: LVEF  50-55%, mild AI.    ECHO: 02/27/2022:  1. Prominent false tendon in the LV apex of no clinical significance..  Left ventricular ejection fraction, by estimation, is 60 to 65%. The  left  ventricle has normal function. The left ventricle demonstrates regional  wall motion abnormalities (see  scoring diagram/findings for description). Left ventricular diastolic  parameters are consistent with Grade I diastolic dysfunction (impaired  relaxation). Possible very small focal area of hypokinesis of the left  ventricular, apical segment but no  visualized in all views.   2. Right ventricular systolic function is normal. The right ventricular  size is normal. There is normal pulmonary artery systolic pressure. The  estimated right ventricular systolic pressure is 40.9 mmHg.   3. The mitral valve is normal in structure. Trivial mitral valve  regurgitation. No evidence of mitral stenosis.   4. The aortic valve is tricuspid. Aortic valve regurgitation is  mild.  Aortic valve sclerosis/calcification is present, without any evidence of  aortic stenosis.   5. Aortic dilatation noted. There is borderline dilatation of the aortic  root, measuring 37 mm. There is mild dilatation of the ascending aorta,  measuring 38 mm.   6. The inferior vena cava is normal in size with greater than 50%  respiratory variability, suggesting right atrial pressure of 3 mmHg.   7. Recommend repeat limited study with definity contrast.    CARDIAC CATH/PCI: 02/27/2022 1.  Patent left main with no stenosis 2.  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  Severe stenosis of the first diagonal subbranch, treated with PTCA, reducing 95% stenosis to 30% with TIMI-3 flow at the completion of the procedure and no evidence of dissection 4.  Patent left circumflex with no stenosis 5.  Patent RCA (dominant vessel) with no significant stenosis   Recommend: Continued aggressive medical therapy.  DAPT with aspirin and clopidogrel x12 months without interruption following non-STEMI.  Consider Pharm.D. 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  He had remotely been seen care by Dr. Maurene Capes and more recently by Dr. Burt Knack.  He developed nonexertional chest tightness which lasted for approximately 4-1/2 hours and a waxing and waning pattern Saturday while as an [employer] at his to his in celebration of the [place].  Assessment & Plan    NSTEMI: He has a history of microvascular angina and has previously undergone 3 prior cardiac catheterizations with his last in 2008.  He did not tolerate nitrates medication in the past with development of migraine headaches..  He has been off any therapy but developed a prolonged episode of chest tightness leading to admission on  February 26, 2022.  Marland Kitchen  He has previously undergone coronary CTA imaging in March 2023 which showed nonobstructive CAD.  Cardiac catheterization was performed yesterday he was found to have systolic bridging of his LAD after takeoff of the proximal bifurcating diagonal vessel in the proximal diagonal vessel had 90 to 95% stenosis which was treated with PTCA.  Due to the vessel caliber stenting was not done.  Echo Doppler study did demonstrate small area of possible apical lateral hypocontractility most likely due to this subbranch of the diagonal vessel supplying this territory.  Presently, he remains pain-free following his intervention.  Hyperlipidemia: LDL cholesterol 79.  Rosuvastatin was increased to 20 mg.  Initial LDL cholesterol was 76.  Plan to recheck lipid studies as outpatient.  Consider further titration of rosuvastatin to 40 mg and or add Zetia to achieve target  LDL less than 50.  Alternatively he may be a candidate for PCSK9 inhibition unable to reach target. Mild aortic regurgitation on echocardiography  Plan DC today on aspirin/Plavix for minimum of 12 months and possibly longer; will continue to treat with initial low-dose amlodipine at 2.5 mg but dose may need to be further titrated depending upon blood pressure.  With his history of microvascular angina, if recurrent symptomatology develops consider initiation of Ranexa as outpatient.  Patient will follow-up with Dr. Burt Knack who he has seen since Dr. Maurene Capes retired.  For questions or updates, please contact Malcolm Please consult [email or web address] for contact info under        Signed, Shelva Majestic, MD  02/28/2022, 8:40 AM

## 2022-02-28 NOTE — Plan of Care (Signed)

## 2022-02-28 NOTE — Progress Notes (Signed)
RN removed PIVs and went over d/c summary w/ pt and pt's wife. Belongings w/ pt, including TOC meds. NT transporting pt to private vehicle where pt's wife will transport pt home

## 2022-03-01 NOTE — Telephone Encounter (Signed)
**Note De-Identified Maylani Embree Obfuscation** Patient contacted regarding discharge from [facility] on 02/28/2022.  Patient understands to follow up with provider Christen Bame, NP on 03/08/2022 at 2:20 at [address] [place] in [place], [place] [address].  Patient understands discharge instructions? Yes Patient understands medications and regiment? Yes Patient understands to bring all medications to this visit? Yes  Ask patient:  Are you enrolled in My Chart: Yes  The pt reports that he is doing well and is without any c/o CP/discomfort, SOB, nausea, vomiting diaphoresis, dizziness, or of any redness, streaking, drainage, or fever at his cath site.  The pt states that he does have [facility]'s phone number and is aware to call us if he has any questions or concerns. He thanked me for my call.

## 2022-03-02 DIAGNOSIS — H532 Diplopia: Secondary | ICD-10-CM | POA: Diagnosis not present

## 2022-03-02 DIAGNOSIS — H5213 Myopia, bilateral: Secondary | ICD-10-CM | POA: Diagnosis not present

## 2022-03-02 DIAGNOSIS — H52203 Unspecified astigmatism, bilateral: Secondary | ICD-10-CM | POA: Diagnosis not present

## 2022-03-03 ENCOUNTER — Encounter: Payer: Self-pay | Admitting: Cardiovascular Disease

## 2022-03-03 DIAGNOSIS — Z0000 Encounter for general adult medical examination without abnormal findings: Secondary | ICD-10-CM | POA: Diagnosis not present

## 2022-03-03 DIAGNOSIS — E7800 Pure hypercholesterolemia, unspecified: Secondary | ICD-10-CM | POA: Diagnosis not present

## 2022-03-03 DIAGNOSIS — E559 Vitamin D deficiency, unspecified: Secondary | ICD-10-CM | POA: Diagnosis not present

## 2022-03-03 DIAGNOSIS — I25118 Atherosclerotic heart disease of native coronary artery with other forms of angina pectoris: Secondary | ICD-10-CM | POA: Diagnosis not present

## 2022-03-03 DIAGNOSIS — R253 Fasciculation: Secondary | ICD-10-CM | POA: Diagnosis not present

## 2022-03-03 DIAGNOSIS — K219 Gastro-esophageal reflux disease without esophagitis: Secondary | ICD-10-CM | POA: Diagnosis not present

## 2022-03-03 DIAGNOSIS — E538 Deficiency of other specified B group vitamins: Secondary | ICD-10-CM | POA: Diagnosis not present

## 2022-03-03 DIAGNOSIS — D649 Anemia, unspecified: Secondary | ICD-10-CM | POA: Diagnosis not present

## 2022-03-03 DIAGNOSIS — I872 Venous insufficiency (chronic) (peripheral): Secondary | ICD-10-CM | POA: Diagnosis not present

## 2022-03-04 NOTE — Progress Notes (Unsigned)
Cardiology Office Note:    Date:  03/08/2022   ID:  Nathaniel [name], DOB 1945-08-14, MRN [id number]  PCP:  Charlane Ferretti, MD   [facility] Providers Cardiologist:  Sherren Mocha, MD     Referring MD: Charlane Ferretti, MD   Chief Complaint: follow-up post PTCA  History of Present Illness:    Nathaniel [name] is a pleasant 76 y.o. male with a hx of CAD, microvascular angina, mixed hyperlipidemia, chronic bradycardia, and palpitations.   Established cardiology care prior to 2012.  Longstanding history of angina despite absence of major epicardial coronary artery blockages. 3 cardiac catheterizations in the early 2000's, including evaluation with intravascular ultrasound, that failed to identify structural cause for his symptoms.  Despite this he had very typical symptoms of stable angina with a predictable threshold of exertion that would bring on symptoms such as walking from the parking lot at a brisk pace or climbing stairs fast.  Diagnosed with left lower extremity DVT October 2012.  He underwent a hypercoagulable work-up and review of the full panel showed no significant abnormalities.    Coronary CTA 08/2021 revealed coronary calcium score of 149, 38 percentile for age/sex, mild atherosclerosis, mild calcified plaque in the proximal LM with associated stenosis of 25 to 49%, mild calcified plaque in the proximal LAD with associated stenosis 25 to 49%, mild soft plaque in mid LAD 25 to 49%,m mild calcified plaque in proximal LCx 25 to 49%  Sinus bradycardia with frequent PVCs on exam 10/2021.  ZIO monitor revealed no high-grade AV block or atrial fibrillation, average HR 53 bpm.   He presented to ED on 02/26/2022 with complaints of chest pain.  He reported this was a sensation of pressure without exertion that lasted for about 4 hours, referred from previous episodes of chest pain.  He reported over the last few months his exertional angina had actually abated until mid August when he  began experiencing very similar complaints at rest scribed as left-sided chest discomfort in the precordial area, a sensation of deep pressure or expansion, sometimes radiating to his left shoulder and left arm.  The symptoms would resolve spontaneously.  He avoids sublingual nitroglycerin because it causes severe headaches.  Day of admission he had a very similar episode that occurred while he was standing in temple/Veshana celebrations.  Symptoms waxed and waned for at least 4 hours with no significant shortness of breath, dizziness, palpitations, presyncope, or syncope. Hs troponin 1632 ? 3332. He underwent cardiac catheterization which revealed patent left main, patent LAD with an intra myocardial bridging of the proximal to mid vessel.  New severe stenosis of first diagonal subbranch treated with PTCA reducing stenosis to 30%.  Recommendation for DAPT with aspirin/Plavix for at least 1 year.  The echo showed a small area of possible apical lateral hypocontractility most likely due to this subbranch of the diagonal vessel supplying this territory. Recommendation to continue aspirin, Plavix, Crestor, amlodipine.  Today, he is here alone for follow-up. He reports he is feeling well and is resuming regular activities. Since hospital discharge, he has felt a non-specific discomfort for a few days.  Was not like the pain he had prior to PTCA. Played golf yesterday (rode, not walked) but played 9 holes without any discomfort. Long history of bradycardia. Occasionally gets lightheaded when both HR and BP are low, no syncope. Has longstanding palpitations that have not worsened. Saw PCP yesterday and hgb was 12.5, planning follow-up with PCP for this. In recent  years, reports less exertional angina, controllable with slowing pace of activity. Symptoms that occurred on 9/17 were more intense and longer lasting. He has multiple questions about the diagonal vessel that was severely stenosed and whether there was evidence  of stenosis on previous CT and what management looks like from this point forward. He denies shortness of breath, orthopnea, PND. Wears compression stockings for varicose veins.   Past Medical History:  Diagnosis Date   Allergic rhinitis    Anemia    pt unaware   Anginal pain ([place])    Microvascular angina- followed by Dr Burt Knack   Arthritis    back   Cancer [facility])    Basal cell -    Coronary artery disease    nonobstructive   DVT (deep venous thrombosis) ([place]) 03/2011   after prolonged travel   GERD (gastroesophageal reflux disease)    Headache    Vision Migraines   Hiatal hernia    small   History of rheumatic fever    questionable   Hyperlipidemia    Increased prostate specific antigen (PSA) velocity    Laryngopharyngeal reflux    Mitral valve prolapse    Palpitations    PE (pulmonary embolism) 03/2011   after prolonged travel   PONV (postoperative nausea and vomiting)    Fentyl- N/V   Pruritus    resolved   Shortness of breath dyspnea    with Exertion   Sinus bradycardia     Past Surgical History:  Procedure Laterality Date   COLONOSCOPY     COLONOSCOPY N/A 02/24/2015   Procedure: COLONOSCOPY;  Surgeon: Ronald Lobo, MD;  Location: [facility] ENDOSCOPY;  Service: Endoscopy;  Laterality: N/A;   CYSTOSCOPY     ESOPHAGOGASTRODUODENOSCOPY N/A 02/06/2013   Procedure: ESOPHAGOGASTRODUODENOSCOPY (EGD);  Surgeon: Cleotis Nipper, MD;  Location: [facility] ENDOSCOPY;  Service: Endoscopy;  Laterality: N/A;   ESOPHAGOGASTRODUODENOSCOPY N/A 02/24/2015   Procedure: ESOPHAGOGASTRODUODENOSCOPY (EGD);  Surgeon: Ronald Lobo, MD;  Location: [facility] ENDOSCOPY;  Service: Endoscopy;  Laterality: N/A;   HUMERUS FRACTURE SURGERY Right 05/2003   LEFT HEART CATH AND CORONARY ANGIOGRAPHY N/A 02/27/2022   Procedure: LEFT HEART CATH AND CORONARY ANGIOGRAPHY;  Surgeon: Sherren Mocha, MD;  Location: [facility];  Service: Cardiovascular;  Laterality: N/A;   Repair of an AC seperation of the left      SHOULDER ARTHROSCOPY Left    A/C tear   TONSILLECTOMY AND ADENOIDECTOMY      Current Medications: Current Meds  Medication Sig   amLODipine (NORVASC) 2.5 MG tablet Take 1 tablet (2.5 mg total) by mouth daily.   azelastine (ASTELIN) 0.1 % nasal spray Place 1-2 sprays into both nostrils 2 (two) times daily as needed for rhinitis or allergies.   cholecalciferol (VITAMIN D) 1000 UNITS tablet Take 1,000 Units by mouth every morning.    ciclopirox (LOPROX) 0.77 % cream Apply 1 Application topically 2 (two) times daily as needed (rash).   clopidogrel (PLAVIX) 75 MG tablet Take 1 tablet (75 mg total) by mouth daily with breakfast.   Cyanocobalamin (VITAMIN B-12) 2500 MCG SUBL Place 1 tablet under the tongue every Sunday.   desoximetasone (TOPICORT) 0.25 % cream Apply 1 Application topically daily as needed (itching on the back).   Magnesium 250 MG TABS Take 500 mg by mouth at bedtime.   mupirocin cream (BACTROBAN) 2 % Apply 1 Application topically 2 (two) times daily as needed (rash).   pantoprazole (PROTONIX) 40 MG tablet Take 1 tablet (40 mg total) by mouth daily  at 10 pm.   rosuvastatin (CRESTOR) 20 MG tablet Take 1 tablet (20 mg total) by mouth daily at 10 pm.   senna (SENOKOT) 8.6 MG tablet Take 1.5 tablets by mouth 2 (two) times daily.   sildenafil (REVATIO) 20 MG tablet Take 20 mg by mouth as needed (For ED).   tamsulosin (FLOMAX) 0.4 MG CAPS capsule Take 0.4 mg by mouth at bedtime.    [DISCONTINUED] aspirin EC 81 MG tablet Take 1 tablet (81 mg total) by mouth daily. Swallow whole.     Allergies:   Penicillins, Fentanyl, and Imdur [isosorbide dinitrate]   Social History   Socioeconomic History   Marital status: Married    Spouse name: Not on file   Number of children: Not on file   Years of education: Not on file   Highest education level: Not on file  Occupational History   Occupation: retired  Tobacco Use   Smoking status: Never   Smokeless tobacco: Never  Substance and  Sexual Activity   Alcohol use: Yes    Comment: occasional- special ocassional   Drug use: No   Sexual activity: Not on file  Other Topics Concern   Not on file  Social History Narrative   Pt lives in [place] with wife and daughter (has autism).  Son is an anesthesia resident in [place].   Retired Materials engineer in 2014.     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   Social Determinants of Health   Financial Resource Strain: Not on file  Food Insecurity: Not on file  Transportation Needs: Not on file  Physical Activity: Not on file  Stress: Not on file  Social Connections: Not on file     Family History: The patient's family history includes CAD (age of onset: 45) in an other family member; Cancer in an other family member; Migraines in his mother.  ROS:   Please see the history of present illness.  All other systems reviewed and are negative.  Labs/Other Studies Reviewed:    The following studies were reviewed today:  Echo 02/27/22  1. Prominent false tendon in the LV apex of no clinical significance..  Left ventricular ejection fraction, by estimation, is 60 to 65%. The left  ventricle has normal function. The left ventricle demonstrates regional  wall motion abnormalities (see  scoring diagram/findings for description). Left ventricular diastolic  parameters are consistent with Grade I diastolic dysfunction (impaired  relaxation). Possible very small focal area of hypokinesis of the left  ventricular, apical segment but no  visualized in all views.   2. Right ventricular systolic function is normal. The right ventricular  size is normal. There is normal pulmonary artery systolic pressure. The  estimated right ventricular systolic pressure is 94.8 mmHg.   3. The mitral valve is normal in structure. Trivial mitral valve  regurgitation. No evidence of mitral stenosis.   4. The aortic valve is tricuspid. Aortic valve regurgitation is mild.  Aortic valve sclerosis/calcification  is present, without any evidence of  aortic stenosis.   5. Aortic dilatation noted. There is borderline dilatation of the aortic  root, measuring 37 mm. There is mild dilatation of the ascending aorta,  measuring 38 mm.   6. The inferior vena cava is normal in size with greater than 50%  respiratory variability, suggesting right atrial pressure of 3 mmHg.   7. Recommend repeat limited study with definity contrast.   LHC 02/27/22  1.  Patent left main with no stenosis 2.  Patent LAD  with intramyocardial bridging of the proximal to mid vessel, stable from previous cath studies 3.  Severe stenosis of the first diagonal subbranch, treated with PTCA, reducing 95% stenosis to 30% with TIMI-3 flow at the completion of the procedure and no evidence of dissection 4.  Patent left circumflex with no stenosis 5.  Patent RCA (dominant vessel) with no significant stenosis   Recommend: Continued aggressive medical therapy.  DAPT with aspirin and clopidogrel x12 months without interruption following non-STEMI.  Consider Pharm.D. clinic referral for PCSK9 inhibition to push LDL cholesterol to 55 or less   Diagnostic Dominance: Right  Intervention   Cardiac monitor 11/13/21  SUMMARY: The basic rhythm is normal sinus with an average HR of 53 bpm No atrial fibrillation or flutter No high-grade heart block There are rare PVC's and rare supraventricular beats, both occurring less than 1% burden The sinus rate is as low as 34 bpm without AV block or junctional beats seen Rare supraventricular runs limited to 9 beats for the longest run       Recent Labs: 01/16/2022: Magnesium 2.3 02/26/2022: ALT 18; TSH 1.481 02/28/2022: BUN 15; Creatinine, Ser 1.23; Hemoglobin 11.7; Platelets 175; Potassium 4.0; Sodium 140  Recent Lipid Panel    Component Value Date/Time   CHOL 140 02/27/2022 0129   CHOL 134 01/16/2022 0811   TRIG 136 02/27/2022 0129   HDL 34 (L) 02/27/2022 0129   HDL 38 (L) 01/16/2022 0811    CHOLHDL 4.1 02/27/2022 0129   VLDL 27 02/27/2022 0129   LDLCALC 79 02/27/2022 0129   LDLCALC 80 01/16/2022 0811     Risk Assessment/Calculations:         Physical Exam:    VS:  BP 108/64 (BP Location: Left Arm, Patient Position: Sitting, Cuff Size: Normal)   Pulse (!) 41   Ht '5\' 6"'$  (1.676 m)   Wt 168 lb 3.2 oz (76.3 kg)   SpO2 95%   BMI 27.15 kg/m     Wt Readings from Last 3 Encounters:  03/08/22 168 lb 3.2 oz (76.3 kg)  02/27/22 163 lb 5.8 oz (74.1 kg)  01/18/22 167 lb (75.8 kg)     GEN:  Well nourished, well developed in no acute distress HEENT: Normal NECK: No JVD; No carotid bruits CARDIAC: RRR, no murmurs, rubs, gallops RESPIRATORY:  Clear to auscultation without rales, wheezing or rhonchi  ABDOMEN: Soft, non-tender, non-distended MUSCULOSKELETAL:  Wearing bilateral compression stockings. 2+ pedal pulses, equal bilaterally SKIN: Warm and dry NEUROLOGIC:  Alert and oriented x 3 PSYCHIATRIC:  Normal affect   EKG:  EKG is  ordered today.  The ekg ordered today demonstrates sinus bradycardia at 41 bpm with 1st degree AV block with PR at 240 ms with nonspecific ST abnormality   Diagnoses:    1. Coronary artery disease of native artery of native heart with stable angina pectoris ([place])   2. Hyperlipidemia LDL goal <70   3. Bradycardia   4. Palpitations   5. First degree AV block    Assessment and Plan:     CAD s/p NSTEMI with stable angina: Severe stenosis of first diagonal subbranch, treated with PTCA, reducing 95% stenosis to 30% stenosis with TIMI-3 flow at the completion of the procedure and no evidence of dissection. Patent LCx, patent RCA, patent LM, patent LAD with intramyocardial bridging of proximal to mid vessel, stable from previous cath studies. He denies chest pain or dyspnea at present.  No indication for further ischemic evaluation at this time. Continue aspirin, Plavix  x12 months. Multiple questions about cath results and monitoring of symptoms going  forward. Some questions were answered to his satisfaction.  He is agreeable that more specific questions will be directed to Dr. Burt Knack who has managed his care for many years and performed the cardiac catheterization on 9/18.  Encouraged 150 minutes of moderate intensity exercise each week. Encouraged heart healthy, mostly plant-based diet. See management of lipids below. Mild decrease in hemoglobin, management per PCP. Continue amlodipine, aspirin, clopidogrel, rosuvastatin.  Hyperlipidemia: Lipoprotein (a) mildly elevated at 76.5. LDL 79, goal < 55. Recent increase in Crestor to 20 mg daily. Reviewed percentages of LDL reduction on various cholesterol agents. Will refer to Pharm D in Lipid clinic for consideration of PCSK9 inhibitor. Continue Crestor.  Bradycardia:  HR 41 bpm by EKG today with 1st degree AV block. He reports occasional HR as low as 30s. Also has PACs  First degree AV block: Noted on EKG with PR interval 240 ms. Occasional lightheadedness, no syncope. He closely monitors HR. Will continue to monitor on serial EKG.   Thoracic aortic dilatation: Borderline dilatation of the aortic root measuring 37 mm.  Mild dilatation of the ascending aorta measuring 38 mm on echo 02/27/2022. Normal caliber aorta on CCTA 08/2021. Would recommend repeat imaging in 1 year.     Disposition: Keep your November appointment with Dr. Burt Knack  Medication Adjustments/Labs and Tests Ordered: Current medicines are reviewed at length with the patient today.  Concerns regarding medicines are outlined above.  Orders Placed This Encounter  Procedures   AMB Referral to [facility] Pharm-D   EKG 12-Lead   Meds ordered this encounter  Medications   aspirin EC 81 MG tablet    Sig: Take 1 tablet (81 mg total) by mouth daily. Swallow whole.    Dispense:  90 tablet    Refill:  2    Patient Instructions  Medication Instructions:   Your physician recommends that you continue on your current medications as  directed. Please refer to the Current Medication list given to you today.   *If you need a refill on your cardiac medications before your next appointment, please call your pharmacy*   Lab Work:  You do NOT need Lab work till after 11/19.  If you have labs (blood work) drawn today and your tests are completely normal, you will receive your results only by: [place] (if you have MyChart) OR A paper copy in the mail If you have any lab test that is abnormal or we need to change your treatment, we will call you to review the results.   Testing/Procedures:  None ordered.   Follow-Up: At [facility], you and your health needs are our priority.  As part of our continuing mission to provide you with exceptional heart care, we have created designated Provider Care Teams.  These Care Teams include your primary Cardiologist (physician) and Advanced Practice Providers (APPs -  Physician Assistants and Nurse Practitioners) who all work together to provide you with the care you need, when you need it.  We recommend signing up for the patient portal called "MyChart".  Sign up information is provided on this After Visit Summary.  MyChart is used to connect with patients for Virtual Visits (Telemedicine).  Patients are able to view lab/test results, encounter notes, upcoming appointments, etc.  Non-urgent messages can be sent to your provider as well.   To learn more about what you can do with MyChart, go to [email or web address].    Your next  appointment:   2 month(s)  The format for your next appointment:   In Person  Provider:   Sherren Mocha, MD     Other Instructions  You have been referred to see the Pharmacist.  You are scheduled for Wednesday, November 1 at 8:30 am.    Important Information About Sugar         Signed, Emmaline Life, NP  03/08/2022 10:13 AM    [place]

## 2022-03-08 ENCOUNTER — Ambulatory Visit: Payer: Medicare Other | Attending: Nurse Practitioner | Admitting: Nurse Practitioner

## 2022-03-08 ENCOUNTER — Encounter: Payer: Self-pay | Admitting: Nurse Practitioner

## 2022-03-08 VITALS — BP 108/64 | HR 41 | Ht 66.0 in | Wt 168.2 lb

## 2022-03-08 DIAGNOSIS — R002 Palpitations: Secondary | ICD-10-CM

## 2022-03-08 DIAGNOSIS — R001 Bradycardia, unspecified: Secondary | ICD-10-CM

## 2022-03-08 DIAGNOSIS — E785 Hyperlipidemia, unspecified: Secondary | ICD-10-CM

## 2022-03-08 DIAGNOSIS — I25118 Atherosclerotic heart disease of native coronary artery with other forms of angina pectoris: Secondary | ICD-10-CM | POA: Diagnosis not present

## 2022-03-08 DIAGNOSIS — I440 Atrioventricular block, first degree: Secondary | ICD-10-CM

## 2022-03-08 MED ORDER — ASPIRIN 81 MG PO TBEC: 81.0000 mg | DELAYED_RELEASE_TABLET | Freq: Every day | ORAL | 2 refills | Status: DC

## 2022-03-08 NOTE — Patient Instructions (Signed)
Medication Instructions:   Your physician recommends that you continue on your current medications as directed. Please refer to the Current Medication list given to you today.   *If you need a refill on your cardiac medications before your next appointment, please call your pharmacy*   Lab Work:  You do NOT need Lab work till after 11/19.  If you have labs (blood work) drawn today and your tests are completely normal, you will receive your results only by: [place] (if you have MyChart) OR A paper copy in the mail If you have any lab test that is abnormal or we need to change your treatment, we will call you to review the results.   Testing/Procedures:  None ordered.   Follow-Up: At [facility], you and your health needs are our priority.  As part of our continuing mission to provide you with exceptional heart care, we have created designated Provider Care Teams.  These Care Teams include your primary Cardiologist (physician) and Advanced Practice Providers (APPs -  Physician Assistants and Nurse Practitioners) who all work together to provide you with the care you need, when you need it.  We recommend signing up for the patient portal called "MyChart".  Sign up information is provided on this After Visit Summary.  MyChart is used to connect with patients for Virtual Visits (Telemedicine).  Patients are able to view lab/test results, encounter notes, upcoming appointments, etc.  Non-urgent messages can be sent to your provider as well.   To learn more about what you can do with MyChart, go to [email or web address].    Your next appointment:   2 month(s)  The format for your next appointment:   In Person  Provider:   Sherren Mocha, MD     Other Instructions  You have been referred to see the Pharmacist.  You are scheduled for Wednesday, November 1 at 8:30 am.    Important Information About Sugar

## 2022-03-10 DIAGNOSIS — D649 Anemia, unspecified: Secondary | ICD-10-CM | POA: Diagnosis not present

## 2022-03-23 DIAGNOSIS — D649 Anemia, unspecified: Secondary | ICD-10-CM | POA: Diagnosis not present

## 2022-04-03 DIAGNOSIS — K639 Disease of intestine, unspecified: Secondary | ICD-10-CM | POA: Diagnosis not present

## 2022-04-03 DIAGNOSIS — K219 Gastro-esophageal reflux disease without esophagitis: Secondary | ICD-10-CM | POA: Diagnosis not present

## 2022-04-03 DIAGNOSIS — D509 Iron deficiency anemia, unspecified: Secondary | ICD-10-CM | POA: Diagnosis not present

## 2022-04-03 DIAGNOSIS — Z23 Encounter for immunization: Secondary | ICD-10-CM | POA: Diagnosis not present

## 2022-04-06 DIAGNOSIS — L821 Other seborrheic keratosis: Secondary | ICD-10-CM | POA: Diagnosis not present

## 2022-04-06 DIAGNOSIS — D2262 Melanocytic nevi of left upper limb, including shoulder: Secondary | ICD-10-CM | POA: Diagnosis not present

## 2022-04-06 DIAGNOSIS — L218 Other seborrheic dermatitis: Secondary | ICD-10-CM | POA: Diagnosis not present

## 2022-04-06 DIAGNOSIS — D2261 Melanocytic nevi of right upper limb, including shoulder: Secondary | ICD-10-CM | POA: Diagnosis not present

## 2022-04-06 DIAGNOSIS — Z85828 Personal history of other malignant neoplasm of skin: Secondary | ICD-10-CM | POA: Diagnosis not present

## 2022-04-12 ENCOUNTER — Ambulatory Visit: Payer: Medicare Other | Attending: Cardiology | Admitting: Student

## 2022-04-12 DIAGNOSIS — E785 Hyperlipidemia, unspecified: Secondary | ICD-10-CM

## 2022-04-12 MED ORDER — EZETIMIBE 10 MG PO TABS: 10.0000 mg | ORAL_TABLET | Freq: Every day | ORAL | 3 refills | Status: DC

## 2022-04-12 NOTE — Patient Instructions (Signed)
Changes made by your pharmacist Cammy Copa, PharmD at today's visit:    Instructions/Changes  (what do you need to do) Your Notes  (what you did and when you did it)  Continue taking Crestor 20 mg daily    2. Start taking ezetimibe 10 mg daily    3.      If you have any questions or concerns please use My Chart to send questions or call the office at [phone]

## 2022-04-12 NOTE — Progress Notes (Signed)
Patient ID: Nathaniel [name]                 DOB: 15-Apr-1946                    MRN: [id number]      HPI: Nathaniel [name] is a 76 y.o. male patient referred to lipid clinic by PCP and Nathaniel Bame, NP. PMH is significant for CAD s/p NSTEMI, microvascular angina, mixed hyperlipidemia, chronic bradycardia, and palpitations.  Coronary CTA 08/2021 revealed coronary calcium score of 149, 38 percentile for age/sex. On 9/17 he went to ED with chest pain underwent the cardiaca catheterization which revealed patent left main, patent LAD with an intra myocardial bridging of the proximal to mid vessel.  New severe stenosis of first diagonal subbranch treated with PTCA reducing stenosis to 30%.  Recommendation for DAPT with aspirin/Plavix for at least 1 year.Lipoprotein (a) mildly elevated at 76.5. LDL 79(02/27/2022)   No acute concern reported today. Patient was on atorvastatin 20 mg for long period of time but last fall he started experiencing severe leg cramps which was affecting sleep at night so stopped taking atorvastatin and pain improved. Then he was put on Crestor 10 mg and after catheterization in sept 2023 the dose was increased to 20 mg. Patient reports tolerating it well without any side effects. We discussed potential other lipid lowering options like Zetia, PCSK9i, its efficacy and potential side effects . Patient is not in agreement to add PCSk9i at this point but may try ezetimibe. Patient was worried about side effects of ezetimibe; provided the detailed common side effect list with percentage of occurrence from [employer] Patient had developed contact dermatitis from gloves in the past so thinks he has latex allergy for future medication optimization avoid using Repatha   Current Medications: Crestor 20 mg  Intolerances:  simvastatin 40 mg , ezetimibe 10 mg,  atorvastatin 20 mg  - severe leg cramps (small and big joints)  Risk Factors: CAD s/p NSTEMI, age LDL goal: <55   Diet: eats  mainly healthy diet- less meat, only chicken and fish, usually avoid fried  fried food  Drink - sweet tea, regular coke, juices, apple cider  Drink water but not crazy amount or keep a track of it.   Exercise: Golf regularly. Knee pain restricting him doing lots of activities that he normally enjoys like kayaking. Encouraged patient to start small walks of 10 min morning and evening and work you way up for  length/distance  Social History:  EtOH: once moth  Smoking: never  Labs: Lipid Panel     Component Value Date/Time   CHOL 140 02/27/2022 0129   CHOL 134 01/16/2022 0811   TRIG 136 02/27/2022 0129   HDL 34 (L) 02/27/2022 0129   HDL 38 (L) 01/16/2022 0811   CHOLHDL 4.1 02/27/2022 0129   VLDL 27 02/27/2022 0129   LDLCALC 79 02/27/2022 0129   LDLCALC 80 01/16/2022 0811   LABVLDL 16 01/16/2022 0811    Past Medical History:  Diagnosis Date   Allergic rhinitis    Anemia    pt unaware   Anginal pain (HCC)    Microvascular angina- followed by Dr Burt Knack   Arthritis    back   Cancer [facility])    Basal cell -    Coronary artery disease    nonobstructive   DVT (deep venous thrombosis) ([place]) 03/2011   after prolonged travel   GERD (gastroesophageal reflux disease)    Headache  Vision Migraines   Hiatal hernia    small   History of rheumatic fever    questionable   Hyperlipidemia    Increased prostate specific antigen (PSA) velocity    Laryngopharyngeal reflux    Mitral valve prolapse    Palpitations    PE (pulmonary embolism) 03/2011   after prolonged travel   PONV (postoperative nausea and vomiting)    Fentyl- N/V   Pruritus    resolved   Shortness of breath dyspnea    with Exertion   Sinus bradycardia     Current Outpatient Medications on File Prior to Visit  Medication Sig Dispense Refill   amLODipine (NORVASC) 2.5 MG tablet Take 1 tablet (2.5 mg total) by mouth daily. 90 tablet 0   aspirin EC 81 MG tablet Take 1 tablet (81 mg total) by mouth daily. Swallow  whole. 90 tablet 2   azelastine (ASTELIN) 0.1 % nasal spray Place 1-2 sprays into both nostrils 2 (two) times daily as needed for rhinitis or allergies.     cholecalciferol (VITAMIN D) 1000 UNITS tablet Take 1,000 Units by mouth every morning.      ciclopirox (LOPROX) 0.77 % cream Apply 1 Application topically 2 (two) times daily as needed (rash).     clopidogrel (PLAVIX) 75 MG tablet Take 1 tablet (75 mg total) by mouth daily with breakfast. 90 tablet 2   Cyanocobalamin (VITAMIN B-12) 2500 MCG SUBL Place 1 tablet under the tongue every Sunday.     desoximetasone (TOPICORT) 0.25 % cream Apply 1 Application topically daily as needed (itching on the back).     Magnesium 250 MG TABS Take 500 mg by mouth at bedtime.     mupirocin cream (BACTROBAN) 2 % Apply 1 Application topically 2 (two) times daily as needed (rash).     pantoprazole (PROTONIX) 40 MG tablet Take 1 tablet (40 mg total) by mouth daily at 10 pm. 90 tablet 0   rosuvastatin (CRESTOR) 20 MG tablet Take 1 tablet (20 mg total) by mouth daily at 10 pm. 90 tablet 0   senna (SENOKOT) 8.6 MG tablet Take 1.5 tablets by mouth 2 (two) times daily.     sildenafil (REVATIO) 20 MG tablet Take 20 mg by mouth as needed (For ED).     tamsulosin (FLOMAX) 0.4 MG CAPS capsule Take 0.4 mg by mouth at bedtime.      No current facility-administered medications on file prior to visit.    Allergies  Allergen Reactions   Penicillins Anaphylaxis, Other (See Comments) and Hives    Hands swell, throat swells Hands swell, throat swells No reaction listed Hives (pt clarified at office visit)   Fentanyl Nausea And Vomiting and Other (See Comments)   Imdur [Isosorbide Dinitrate] Other (See Comments)    Migraine headache     Hyperlipidemia Assessment: LDLc 79 (02/27/2022) above the goal '55mg'$ /dl Patient reports taking rosuvastatin 20 mg regularly and tolerating it well without any side effects  The dose of rosuvastatin was increased from 10 to 20 mg just  one month ago (post catheterization)  Discussed  potential other lipid lowering options like Zetia, PCSK9i, its efficacy and potential side effects  Patient is not in agreement to add PCSk9i at this point but may try ezetimibe with follow up lab early Jan  Plan:  Continue taking rosuvastatin 10 mg daily Start taking ezetimibe 10 mg daily Will repeat the lab early January to see the effect  In future can consider Praluent if LDLc still stays high (Avoid  Repatha- as patient had episodes of contact dermatitis from latex gloves ) follow up with pharmacist scheduled early January    Thank you,  Cammy Copa, Pharm.D [facility] Division of [facility] [address], [place], [place] [address]  Phone: [phone]; Fax: [phone]

## 2022-04-12 NOTE — Assessment & Plan Note (Addendum)
Assessment:  LDLc 79 (02/27/2022) above the goal '55mg'$ /dl  Patient reports taking rosuvastatin 20 mg regularly and tolerating it well without any side effects   The dose of rosuvastatin was increased from 10 to 20 mg just one month ago (post catheterization)   Discussed  potential other lipid lowering options like Zetia, PCSK9i, its efficacy and potential side effects   Patient is not in agreement to add PCSk9i at this point but may try ezetimibe with follow up lab early Jan  Plan:   Continue taking rosuvastatin 10 mg daily  Start taking ezetimibe 10 mg daily  Will repeat the lab early January to see the effect   In future can consider Praluent if LDLc still stays high (Avoid Repatha- as patient had episodes of contact dermatitis from latex gloves )  Follow up with pharmacist has been scheduled early January

## 2022-04-14 DIAGNOSIS — Z885 Allergy status to narcotic agent status: Secondary | ICD-10-CM | POA: Diagnosis not present

## 2022-04-14 DIAGNOSIS — R252 Cramp and spasm: Secondary | ICD-10-CM | POA: Diagnosis not present

## 2022-04-14 DIAGNOSIS — H9313 Tinnitus, bilateral: Secondary | ICD-10-CM | POA: Diagnosis not present

## 2022-04-14 DIAGNOSIS — Z888 Allergy status to other drugs, medicaments and biological substances status: Secondary | ICD-10-CM | POA: Diagnosis not present

## 2022-04-14 DIAGNOSIS — H6123 Impacted cerumen, bilateral: Secondary | ICD-10-CM | POA: Diagnosis not present

## 2022-04-14 DIAGNOSIS — Z7982 Long term (current) use of aspirin: Secondary | ICD-10-CM | POA: Diagnosis not present

## 2022-04-14 DIAGNOSIS — Z79899 Other long term (current) drug therapy: Secondary | ICD-10-CM | POA: Diagnosis not present

## 2022-04-14 DIAGNOSIS — H903 Sensorineural hearing loss, bilateral: Secondary | ICD-10-CM | POA: Diagnosis not present

## 2022-04-14 DIAGNOSIS — Z7902 Long term (current) use of antithrombotics/antiplatelets: Secondary | ICD-10-CM | POA: Diagnosis not present

## 2022-04-14 DIAGNOSIS — Z880 Allergy status to penicillin: Secondary | ICD-10-CM | POA: Diagnosis not present

## 2022-04-19 DIAGNOSIS — M79662 Pain in left lower leg: Secondary | ICD-10-CM | POA: Diagnosis not present

## 2022-04-19 DIAGNOSIS — I252 Old myocardial infarction: Secondary | ICD-10-CM | POA: Diagnosis not present

## 2022-04-19 DIAGNOSIS — M79661 Pain in right lower leg: Secondary | ICD-10-CM | POA: Diagnosis not present

## 2022-04-19 DIAGNOSIS — R252 Cramp and spasm: Secondary | ICD-10-CM | POA: Diagnosis not present

## 2022-04-19 DIAGNOSIS — G4452 New daily persistent headache (NDPH): Secondary | ICD-10-CM | POA: Diagnosis not present

## 2022-04-19 DIAGNOSIS — R519 Headache, unspecified: Secondary | ICD-10-CM | POA: Diagnosis not present

## 2022-04-19 DIAGNOSIS — H532 Diplopia: Secondary | ICD-10-CM | POA: Diagnosis not present

## 2022-04-24 ENCOUNTER — Ambulatory Visit: Payer: Medicare Other | Attending: Cardiovascular Disease | Admitting: Cardiovascular Disease

## 2022-04-24 ENCOUNTER — Encounter: Payer: Self-pay | Admitting: Cardiovascular Disease

## 2022-04-24 VITALS — BP 112/54 | HR 52 | Ht 67.0 in | Wt 169.6 lb

## 2022-04-24 DIAGNOSIS — I25118 Atherosclerotic heart disease of native coronary artery with other forms of angina pectoris: Secondary | ICD-10-CM

## 2022-04-24 DIAGNOSIS — E785 Hyperlipidemia, unspecified: Secondary | ICD-10-CM

## 2022-04-24 DIAGNOSIS — R001 Bradycardia, unspecified: Secondary | ICD-10-CM | POA: Diagnosis not present

## 2022-04-24 NOTE — Progress Notes (Unsigned)
Cardiology Office Note:    Date:  05/01/2022   ID:  Nathaniel [name], DOB 04/30/1946, MRN [id number]  PCP:  Charlane Ferretti, MD   [facility] Prov[facility] Cardiologist:  Sherren Mocha, MD     Referring MD: Lajean Manes, MD   Chief Complaint  Patient presents with   Coronary Artery Disease    History of Present Illness:    Nathaniel [name] is a 76 y.o. male with a hx of coronary artery disease, chronic microvascular angina, mixed hyperlipidemia, chronic bradycardia, and recent non-STEMI, presenting for follow-up evaluation.  The patient has a longstanding history of nonobstructive CAD with typical symptoms of exertional angina felt to be related to microvascular dysfunction.  He was hospitalized in September 2023 with chest pain and ruled in for non-STEMI with a troponin peak of about 3000.  He underwent cardiac catheterization which revealed a severe stenosis in the branch of the first diagonal had bifurcation point.  This was a small caliber vessel that was treated with balloon angioplasty.  His other epicardial coronaries had no high-grade obstruction.  There was a bridging segment in the mid LAD which had been seen on past cardiac catheterization studies.  There were otherwise no significant changes noted.  The patient is doing well since hospital discharge.  He was seen by [facility] 1 year March 08, 2022.  No medication changes were made at that time.  The patient's rosuvastatin dose was doubled at the time of his hospitalization.  He has not had follow-up lipids yet.  He denies recurrence of chest pain, chest pressure, or shortness of breath.  He has no leg edema.  No recent heart palpitations.  Past Medical History:  Diagnosis Date   Allergic rhinitis    Anemia    pt unaware   Anginal pain ([place])    Microvascular angina- followed by Dr Burt Knack   Arthritis    back   Cancer [facility])    Basal cell -    Coronary artery disease    nonobstructive   DVT (deep venous  thrombosis) ([place]) 03/2011   after prolonged travel   GERD (gastroesophageal reflux disease)    Headache    Vision Migraines   Hiatal hernia    small   History of rheumatic fever    questionable   Hyperlipidemia    Increased prostate specific antigen (PSA) velocity    Laryngopharyngeal reflux    Mitral valve prolapse    Palpitations    PE (pulmonary embolism) 03/2011   after prolonged travel   PONV (postoperative nausea and vomiting)    Fentyl- N/V   Pruritus    resolved   Shortness of breath dyspnea    with Exertion   Sinus bradycardia     Past Surgical History:  Procedure Laterality Date   COLONOSCOPY     COLONOSCOPY N/A 02/24/2015   Procedure: COLONOSCOPY;  Surgeon: Ronald Lobo, MD;  Location: [facility] ENDOSCOPY;  Service: Endoscopy;  Laterality: N/A;   CYSTOSCOPY     ESOPHAGOGASTRODUODENOSCOPY N/A 02/06/2013   Procedure: ESOPHAGOGASTRODUODENOSCOPY (EGD);  Surgeon: Cleotis Nipper, MD;  Location: [facility] ENDOSCOPY;  Service: Endoscopy;  Laterality: N/A;   ESOPHAGOGASTRODUODENOSCOPY N/A 02/24/2015   Procedure: ESOPHAGOGASTRODUODENOSCOPY (EGD);  Surgeon: Ronald Lobo, MD;  Location: [facility] ENDOSCOPY;  Service: Endoscopy;  Laterality: N/A;   HUMERUS FRACTURE SURGERY Right 05/2003   LEFT HEART CATH AND CORONARY ANGIOGRAPHY N/A 02/27/2022   Procedure: LEFT HEART CATH AND CORONARY ANGIOGRAPHY;  Surgeon: Sherren Mocha, MD;  Location: [place] CV  LAB;  Service: Cardiovascular;  Laterality: N/A;   Repair of an AC seperation of the left     SHOULDER ARTHROSCOPY Left    A/C tear   TONSILLECTOMY AND ADENOIDECTOMY      Current Medications: Current Meds  Medication Sig   amLODipine (NORVASC) 2.5 MG tablet Take 1 tablet (2.5 mg total) by mouth daily.   aspirin EC 81 MG tablet Take 1 tablet (81 mg total) by mouth daily. Swallow whole.   azelastine (ASTELIN) 0.1 % nasal spray Place 1-2 sprays into both nostrils 2 (two) times daily as needed for rhinitis or allergies.   cholecalciferol  (VITAMIN D) 1000 UNITS tablet Take 1,000 Units by mouth every morning.    ciclopirox (LOPROX) 0.77 % cream Apply 1 Application topically 2 (two) times daily as needed (rash).   clopidogrel (PLAVIX) 75 MG tablet Take 1 tablet (75 mg total) by mouth daily with breakfast.   Cyanocobalamin (VITAMIN B-12) 2500 MCG SUBL Place 1 tablet under the tongue every Sunday.   desoximetasone (TOPICORT) 0.25 % cream Apply 1 Application topically daily as needed (itching on the back).   ezetimibe (ZETIA) 10 MG tablet Take 1 tablet (10 mg total) by mouth daily.   ferrous sulfate 325 (65 FE) MG EC tablet Take 325 mg by mouth every other day.   Magnesium 250 MG TABS Take 500 mg by mouth at bedtime.   mupirocin cream (BACTROBAN) 2 % Apply 1 Application topically 2 (two) times daily as needed (rash).   pantoprazole (PROTONIX) 40 MG tablet Take 1 tablet (40 mg total) by mouth daily at 10 pm.   rosuvastatin (CRESTOR) 20 MG tablet Take 1 tablet (20 mg total) by mouth daily at 10 pm.   senna (SENOKOT) 8.6 MG tablet Take 1.5 tablets by mouth 2 (two) times daily.   sildenafil (REVATIO) 20 MG tablet Take 20 mg by mouth as needed (For ED).   tamsulosin (FLOMAX) 0.4 MG CAPS capsule Take 0.4 mg by mouth at bedtime.      Allergies:   Penicillins, Fentanyl, and Imdur [isosorbide dinitrate]   Social History   Socioeconomic History   Marital status: Married    Spouse name: Not on file   Number of children: Not on file   Years of education: Not on file   Highest education level: Not on file  Occupational History   Occupation: retired  Tobacco Use   Smoking status: Never   Smokeless tobacco: Never  Substance and Sexual Activity   Alcohol use: Yes    Comment: occasional- special ocassional   Drug use: No   Sexual activity: Not on file  Other Topics Concern   Not on file  Social History Narrative   Pt lives in [place] with wife and daughter (has autism).  Son is an anesthesia resident in [place].   Retired  Materials engineer in 2014.     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   Social Determinants of Health   Financial Resource Strain: Not on file  Food Insecurity: Not on file  Transportation Needs: Not on file  Physical Activity: Not on file  Stress: Not on file  Social Connections: Not on file     Family History: The patient's family history includes CAD (age of onset: 46) in an other family member; Cancer in an other family member; Migraines in his mother.  ROS:   Please see the history of present illness.    All other systems reviewed and are negative.  EKGs/Labs/Other Studies Reviewed:  The following studies were reviewed today: Cardiac Cath 02/27/2022: 1.  Patent left main with no stenosis 2.  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  Severe stenosis of the first diagonal subbranch, treated with PTCA, reducing 95% stenosis to 30% with TIMI-3 flow at the completion of the procedure and no evidence of dissection 4.  Patent left circumflex with no stenosis 5.  Patent RCA (dominant vessel) with no significant stenosis   Recommend: Continued aggressive medical therapy.  DAPT with aspirin and clopidogrel x12 months without interruption following non-STEMI.  Consider Pharm.D. clinic referral for PCSK9 inhibition to push LDL cholesterol to 55 or less  Echo 02/27/2022:  1. Prominent false tendon in the LV apex of no clinical significance..  Left ventricular ejection fraction, by estimation, is 60 to 65%. The left  ventricle has normal function. The left ventricle demonstrates regional  wall motion abnormalities (see  scoring diagram/findings for description). Left ventricular diastolic  parameters are consistent with Grade I diastolic dysfunction (impaired  relaxation). Possible very small focal area of hypokinesis of the left  ventricular, apical segment but no  visualized in all views.   2. Right ventricular systolic  function is normal. The right ventricular  size is normal. There is normal pulmonary artery systolic pressure. The  estimated right ventricular systolic pressure is 29.7 mmHg.   3. The mitral valve is normal in structure. Trivial mitral valve  regurgitation. No evidence of mitral stenosis.   4. The aortic valve is tricuspid. Aortic valve regurgitation is mild.  Aortic valve sclerosis/calcification is present, without any evidence of  aortic stenosis.   5. Aortic dilatation noted. There is borderline dilatation of the aortic  root, measuring 37 mm. There is mild dilatation of the ascending aorta,  measuring 38 mm.   6. The inferior vena cava is normal in size with greater than 50%  respiratory variability, suggesting right atrial pressure of 3 mmHg.   7. Recommend repeat limited study with definity contrast.   EKG:  EKG is not ordered today.    Recent Labs: 01/16/2022: Magnesium 2.3 02/26/2022: ALT 18; TSH 1.481 02/28/2022: BUN 15; Creatinine, Ser 1.23; Hemoglobin 11.7; Platelets 175; Potassium 4.0; Sodium 140  Recent Lipid Panel    Component Value Date/Time   CHOL 140 02/27/2022 0129   CHOL 134 01/16/2022 0811   TRIG 136 02/27/2022 0129   HDL 34 (L) 02/27/2022 0129   HDL 38 (L) 01/16/2022 0811   CHOLHDL 4.1 02/27/2022 0129   VLDL 27 02/27/2022 0129   LDLCALC 79 02/27/2022 0129   LDLCALC 80 01/16/2022 0811     Risk Assessment/Calculations:            Physical Exam:    VS:  BP (!) 112/54   Pulse (!) 52   Ht '5\' 7"'$  (1.702 m)   Wt 169 lb 9.6 oz (76.9 kg)   SpO2 94%   BMI 26.56 kg/m     Wt Readings from Last 3 Encounters:  04/24/22 169 lb 9.6 oz (76.9 kg)  03/08/22 168 lb 3.2 oz (76.3 kg)  02/27/22 163 lb 5.8 oz (74.1 kg)     GEN:  Well nourished, well developed in no acute distress HEENT: Normal NECK: No JVD; No carotid bruits LYMPHATICS: No lymphadenopathy CARDIAC: RRR, no murmurs, rubs, gallops RESPIRATORY:  Clear to auscultation without rales, wheezing or  rhonchi  ABDOMEN: Soft, non-tender, non-distended MUSCULOSKELETAL:  No edema; No deformity  SKIN: Warm and dry  NEUROLOGIC:  Alert and oriented x 3 PSYCHIATRIC:  Normal affect   ASSESSMENT:    1. Hyperlipidemia LDL goal <70   2. Coronary artery disease of native artery of native heart with stable angina pectoris ([place])   3. Bradycardia    PLAN:    In order of problems listed above:  Rosuvastatin was doubled during his hospitalization.  Will update lipids and LFTs.  Anticipate addition of Zetia 10 mg daily once his lipids result.  Would like to push his LDL cholesterol below 55 mg/dL after recent ACS.  Discussed at length today.  Consider PCSK9 inhibition if unable to achieve LDL less than 55 on combination of rosuvastatin and Zetia. Stable after PTCA of a small diagonal subbranch.  Reviewed cardiac catheterization films, both baseline films from remote cath and recent films demonstrating change in his diagonal branch and stability of his CAD elsewhere.  Aggressive risk reduction as outlined. Stable without high-grade AV block           Medication Adjustments/Labs and Tests Ordered: Current medicines are reviewed at length with the patient today.  Concerns regarding medicines are outlined above.  Orders Placed This Encounter  Procedures   Hepatic function panel   Lipid panel   No orders of the defined types were placed in this encounter.   Patient Instructions  Medication Instructions:  Your physician recommends that you continue on your current medications as directed. Please refer to the Current Medication list given to you today.  *If you need a refill on your cardiac medications before your next appointment, please call your pharmacy*   Lab Work: Lipids, LFT's today (then zetia use will be determined) If you have labs (blood work) drawn today and your tests are completely normal, you will receive your results only by: [place] (if you have MyChart) OR A paper  copy in the mail If you have any lab test that is abnormal or we need to change your treatment, we will call you to review the results.   Testing/Procedures: NONE   Follow-Up: At [facility], you and your health needs are our priority.  As part of our continuing mission to provide you with exceptional heart care, we have created designated Provider Care Teams.  These Care Teams include your primary Cardiologist (physician) and Advanced Practice Providers (APPs -  Physician Assistants and Nurse Practitioners) who all work together to provide you with the care you need, when you need it.  We recommend signing up for the patient portal called "MyChart".  Sign up information is provided on this After Visit Summary.  MyChart is used to connect with patients for Virtual Visits (Telemedicine).  Patients are able to view lab/test results, encounter notes, upcoming appointments, etc.  Non-urgent messages can be sent to your provider as well.   To learn more about what you can do with MyChart, go to [email or web address].    Your next appointment:   6 month(s)  The format for your next appointment:   In Person  Provider:   Sherren Mocha, MD       Important Information About Sugar         Signed, Sherren Mocha, MD  05/01/2022 6:19 AM    [place]

## 2022-04-24 NOTE — Patient Instructions (Signed)
Medication Instructions:  Your physician recommends that you continue on your current medications as directed. Please refer to the Current Medication list given to you today.  *If you need a refill on your cardiac medications before your next appointment, please call your pharmacy*   Lab Work: Lipids, LFT's today (then zetia use will be determined) If you have labs (blood work) drawn today and your tests are completely normal, you will receive your results only by: [place] (if you have MyChart) OR A paper copy in the mail If you have any lab test that is abnormal or we need to change your treatment, we will call you to review the results.   Testing/Procedures: NONE   Follow-Up: At [facility], you and your health needs are our priority.  As part of our continuing mission to provide you with exceptional heart care, we have created designated Provider Care Teams.  These Care Teams include your primary Cardiologist (physician) and Advanced Practice Providers (APPs -  Physician Assistants and Nurse Practitioners) who all work together to provide you with the care you need, when you need it.  We recommend signing up for the patient portal called "MyChart".  Sign up information is provided on this After Visit Summary.  MyChart is used to connect with patients for Virtual Visits (Telemedicine).  Patients are able to view lab/test results, encounter notes, upcoming appointments, etc.  Non-urgent messages can be sent to your provider as well.   To learn more about what you can do with MyChart, go to [email or web address].    Your next appointment:   6 month(s)  The format for your next appointment:   In Person  Provider:   Sherren Mocha, MD       Important Information About Sugar

## 2022-04-27 DIAGNOSIS — M25561 Pain in right knee: Secondary | ICD-10-CM | POA: Diagnosis not present

## 2022-04-27 DIAGNOSIS — M1711 Unilateral primary osteoarthritis, right knee: Secondary | ICD-10-CM | POA: Diagnosis not present

## 2022-04-28 DIAGNOSIS — G319 Degenerative disease of nervous system, unspecified: Secondary | ICD-10-CM | POA: Diagnosis not present

## 2022-04-28 DIAGNOSIS — H532 Diplopia: Secondary | ICD-10-CM | POA: Diagnosis not present

## 2022-04-28 DIAGNOSIS — R519 Headache, unspecified: Secondary | ICD-10-CM | POA: Diagnosis not present

## 2022-05-03 ENCOUNTER — Ambulatory Visit: Payer: Medicare Other | Attending: Cardiovascular Disease

## 2022-05-03 DIAGNOSIS — E785 Hyperlipidemia, unspecified: Secondary | ICD-10-CM | POA: Diagnosis not present

## 2022-05-04 LAB — LIPID PANEL
Chol/HDL Ratio: 2.7 ratio (ref 0.0–5.0)
Cholesterol, Total: 105 mg/dL (ref 100–199)
HDL: 39 mg/dL — ABNORMAL LOW (ref >39)
LDL Chol Calc (NIH): 52 mg/dL (ref 0–99)
Triglycerides: 62 mg/dL (ref 0–149)
VLDL Cholesterol Cal: 14 mg/dL (ref 5–40)

## 2022-05-04 LAB — HEPATIC FUNCTION PANEL
ALT: 17 IU/L (ref 0–44)
AST: 18 IU/L (ref 0–40)
Albumin: 4.5 g/dL (ref 3.8–4.8)
Alkaline Phosphatase: 61 IU/L (ref 44–121)
Bilirubin Total: 0.5 mg/dL (ref 0.0–1.2)
Bilirubin, Direct: 0.17 mg/dL (ref 0.00–0.40)
Total Protein: 6.4 g/dL (ref 6.0–8.5)

## 2022-05-22 ENCOUNTER — Telehealth: Payer: Self-pay | Admitting: Cardiovascular Disease

## 2022-05-22 MED ORDER — CLOPIDOGREL BISULFATE 75 MG PO TABS: 75.0000 mg | ORAL_TABLET | Freq: Every day | ORAL | 3 refills | Status: DC

## 2022-05-22 MED ORDER — AMLODIPINE BESYLATE 2.5 MG PO TABS: 2.5000 mg | ORAL_TABLET | Freq: Every day | ORAL | 3 refills | Status: DC

## 2022-05-22 MED ORDER — ROSUVASTATIN CALCIUM 20 MG PO TABS: 20.0000 mg | ORAL_TABLET | Freq: Every day | ORAL | 3 refills | Status: DC

## 2022-05-22 NOTE — Telephone Encounter (Signed)
Pt's medication was sent to pt's pharmacy as requested. Confirmation received.  °

## 2022-05-22 NOTE — Telephone Encounter (Signed)
*  STAT* If patient is at the pharmacy, call can be transferred to refill team.   1. Which medications need to be refilled? (please list name of each medication and dose if known) rosuvastatin (CRESTOR) 20 MG tablet   amLODipine (NORVASC) 2.5 MG tablet    clopidogrel (PLAVIX) 75 MG tablet    2. Which pharmacy/location (including street and city if local pharmacy) is medication to be sent to?  [id number] - [place], [place] - [address] AT [address]    3. Do they need a 30 day or 90 day supply? [place]

## 2022-05-31 DIAGNOSIS — D509 Iron deficiency anemia, unspecified: Secondary | ICD-10-CM | POA: Diagnosis not present

## 2022-06-14 ENCOUNTER — Telehealth: Payer: Self-pay | Admitting: Cardiovascular Disease

## 2022-06-14 DIAGNOSIS — M488X6 Other specified spondylopathies, lumbar region: Secondary | ICD-10-CM | POA: Diagnosis not present

## 2022-06-14 DIAGNOSIS — R252 Cramp and spasm: Secondary | ICD-10-CM | POA: Diagnosis not present

## 2022-06-14 DIAGNOSIS — Z79899 Other long term (current) drug therapy: Secondary | ICD-10-CM | POA: Diagnosis not present

## 2022-06-14 NOTE — Telephone Encounter (Signed)
Spoke with pt who is asking if Lipid Clinic appt is needed.  At his last appt with Dr Burt Knack pt reports Dr Burt Knack was happy with most recent lipid panel and LDL of 52.  Pt denies having any issues tolerating his current medication and will continue as ordered.

## 2022-06-14 NOTE — Telephone Encounter (Signed)
Patient calling to see if the appt on 1/8 is still needed. Please advise

## 2022-06-15 ENCOUNTER — Other Ambulatory Visit: Payer: Medicare Other

## 2022-06-15 ENCOUNTER — Other Ambulatory Visit: Payer: Self-pay | Admitting: *Deleted

## 2022-06-15 ENCOUNTER — Inpatient Hospital Stay: Payer: Medicare Other | Attending: Oncology

## 2022-06-15 ENCOUNTER — Encounter: Payer: Self-pay | Admitting: *Deleted

## 2022-06-15 DIAGNOSIS — I252 Old myocardial infarction: Secondary | ICD-10-CM | POA: Diagnosis not present

## 2022-06-15 DIAGNOSIS — Z86718 Personal history of other venous thrombosis and embolism: Secondary | ICD-10-CM | POA: Insufficient documentation

## 2022-06-15 DIAGNOSIS — Z86711 Personal history of pulmonary embolism: Secondary | ICD-10-CM | POA: Insufficient documentation

## 2022-06-15 DIAGNOSIS — Z862 Personal history of diseases of the blood and blood-forming organs and certain disorders involving the immune mechanism: Secondary | ICD-10-CM

## 2022-06-15 DIAGNOSIS — I2510 Atherosclerotic heart disease of native coronary artery without angina pectoris: Secondary | ICD-10-CM | POA: Insufficient documentation

## 2022-06-15 DIAGNOSIS — N400 Enlarged prostate without lower urinary tract symptoms: Secondary | ICD-10-CM | POA: Diagnosis not present

## 2022-06-15 DIAGNOSIS — K219 Gastro-esophageal reflux disease without esophagitis: Secondary | ICD-10-CM | POA: Diagnosis not present

## 2022-06-15 DIAGNOSIS — D649 Anemia, unspecified: Secondary | ICD-10-CM | POA: Diagnosis not present

## 2022-06-15 LAB — CBC WITH DIFFERENTIAL (CANCER CENTER ONLY)
Abs Immature Granulocytes: 0.02 10*3/uL (ref 0.00–0.07)
Basophils Absolute: 0 10*3/uL (ref 0.0–0.1)
Basophils Relative: 1 %
Eosinophils Absolute: 0.1 10*3/uL (ref 0.0–0.5)
Eosinophils Relative: 3 %
HCT: 38.9 % — ABNORMAL LOW (ref 39.0–52.0)
Hemoglobin: 12.7 g/dL — ABNORMAL LOW (ref 13.0–17.0)
Immature Granulocytes: 0 %
Lymphocytes Relative: 12 %
Lymphs Abs: 0.7 10*3/uL (ref 0.7–4.0)
MCH: 29.2 pg (ref 26.0–34.0)
MCHC: 32.6 g/dL (ref 30.0–36.0)
MCV: 89.4 fL (ref 80.0–100.0)
Monocytes Absolute: 0.5 10*3/uL (ref 0.1–1.0)
Monocytes Relative: 9 %
Neutro Abs: 4.3 10*3/uL (ref 1.7–7.7)
Neutrophils Relative %: 75 %
Platelet Count: 237 10*3/uL (ref 150–400)
RBC: 4.35 MIL/uL (ref 4.22–5.81)
RDW: 14.4 % (ref 11.5–15.5)
WBC Count: 5.7 10*3/uL (ref 4.0–10.5)
nRBC: 0 % (ref 0.0–0.2)

## 2022-06-15 LAB — SAVE SMEAR(SSMR), FOR PROVIDER SLIDE REVIEW

## 2022-06-15 NOTE — Progress Notes (Signed)
Referral placed for Heme consult and orders placed for labs. New Pt appt for 06/16/22 at 2:40pm

## 2022-06-16 ENCOUNTER — Other Ambulatory Visit: Payer: Self-pay | Admitting: *Deleted

## 2022-06-16 ENCOUNTER — Inpatient Hospital Stay: Payer: Medicare Other | Admitting: Oncology

## 2022-06-16 VITALS — BP 139/71 | HR 49 | Temp 98.2°F | Resp 18 | Ht 67.0 in | Wt 167.0 lb

## 2022-06-16 DIAGNOSIS — Z862 Personal history of diseases of the blood and blood-forming organs and certain disorders involving the immune mechanism: Secondary | ICD-10-CM

## 2022-06-16 DIAGNOSIS — D649 Anemia, unspecified: Secondary | ICD-10-CM | POA: Diagnosis not present

## 2022-06-16 LAB — CMP (CANCER CENTER ONLY)
ALT: 13 U/L (ref 0–44)
AST: 15 U/L (ref 15–41)
Albumin: 4.5 g/dL (ref 3.5–5.0)
Alkaline Phosphatase: 53 U/L (ref 38–126)
Anion gap: 7 (ref 5–15)
BUN: 19 mg/dL (ref 8–23)
CO2: 29 mmol/L (ref 22–32)
Calcium: 9.5 mg/dL (ref 8.9–10.3)
Chloride: 105 mmol/L (ref 98–111)
Creatinine: 1.24 mg/dL (ref 0.61–1.24)
GFR, Estimated: 60 mL/min — ABNORMAL LOW (ref >60)
Glucose, Bld: 107 mg/dL — ABNORMAL HIGH (ref 70–99)
Potassium: 4.4 mmol/L (ref 3.5–5.1)
Sodium: 141 mmol/L (ref 135–145)
Total Bilirubin: 0.5 mg/dL (ref 0.3–1.2)
Total Protein: 6.9 g/dL (ref 6.5–8.1)

## 2022-06-16 LAB — RETICULOCYTES
Immature Retic Fract: 8.3 % (ref 2.3–15.9)
RBC.: 4.38 MIL/uL (ref 4.22–5.81)
Retic Count, Absolute: 49.1 10*3/uL (ref 19.0–186.0)
Retic Ct Pct: 1.1 % (ref 0.4–3.1)

## 2022-06-16 LAB — MAGNESIUM: Magnesium: 2.2 mg/dL (ref 1.7–2.4)

## 2022-06-16 LAB — TSH: TSH: 1.804 u[IU]/mL (ref 0.350–4.500)

## 2022-06-16 NOTE — Progress Notes (Signed)
[facility] Patient Consult   Requesting MD: Charlane Ferretti, Md [place],  [place] [address]   Nathaniel [name] 77 y.o.  10-Jun-1946    Reason for Consult: Anemia   HPI: Dr. Ralene Ok has noted a slow decline in his hemoglobin over the past 10 years.  He has a copy of multiple hemoglobin results dating back to 2004 when the hemoglobin returned at 15.3.  In August 2020 the hemoglobin returned at 13.3. He was admitted me in September 2023.  The CBC on 02/26/2022 found the hemoglobin at 12.6, hematocrit 37.6%, MCV 90.2, white count 5.0, and platelets 189,000.  A CBC on 05/31/2022 found the hemoglobin 11.7, hematocrit 35.8%.  The ferritin returned at 94.2 with a serum iron of 56, TIBC 349, and percent saturation 16.  He generally feels well.  No bleeding.  He was diagnosed with COVID-19 in late November 2023.  Past Medical History:  Diagnosis Date   Allergic rhinitis    Anemia September 2023      Anginal pain [facility])    Microvascular angina- followed by Dr Burt Knack   Arthritis    back   Cancer [facility])    Basal cell -    Coronary artery disease    nonobstructive   DVT (deep venous thrombosis) ([place]) 03/2011   after prolonged travel   GERD (gastroesophageal reflux disease)    Headache-migraine    Ocular migraines   Hiatal hernia    small   History of rheumatic fever    questionable   Hyperlipidemia    Increased prostate specific antigen (PSA) velocity    Laryngopharyngeal reflux    Mitral valve prolapse    Palpitations    PE (pulmonary embolism) 03/2011   after prolonged travel   PONV (postoperative nausea and vomiting)    Fentyl- N/V   Pruritus-back       Shortness of breath dyspnea    with Exertion   Sinus bradycardia     .  Low IgA level in 2021  Past Surgical History:  Procedure Laterality Date   COLONOSCOPY     COLONOSCOPY N/A 02/24/2015   Procedure: COLONOSCOPY;  Surgeon: Ronald Lobo, MD;  Location: [facility] ENDOSCOPY;   Service: Endoscopy;  Laterality: N/A;   CYSTOSCOPY     ESOPHAGOGASTRODUODENOSCOPY N/A 02/06/2013   Procedure: ESOPHAGOGASTRODUODENOSCOPY (EGD);  Surgeon: Cleotis Nipper, MD;  Location: [facility] ENDOSCOPY;  Service: Endoscopy;  Laterality: N/A;   ESOPHAGOGASTRODUODENOSCOPY N/A 02/24/2015   Procedure: ESOPHAGOGASTRODUODENOSCOPY (EGD);  Surgeon: Ronald Lobo, MD;  Location: [facility] ENDOSCOPY;  Service: Endoscopy;  Laterality: N/A;   HUMERUS FRACTURE SURGERY Right 05/2003   LEFT HEART CATH AND CORONARY ANGIOGRAPHY N/A 02/27/2022   Procedure: LEFT HEART CATH AND CORONARY ANGIOGRAPHY;  Surgeon: Sherren Mocha, MD;  Location: [place] CV LAB;  Service: Cardiovascular;  Laterality: N/A;   Repair of an AC seperation of the left     SHOULDER ARTHROSCOPY Left    A/C tear   TONSILLECTOMY AND ADENOIDECTOMY      .  Right knee arthroscopy   .  Mohs surgery for treatment of a squamous cell carcinoma of the nose  Medications: Reviewed  Allergies:  Allergies  Allergen Reactions   Penicillins Anaphylaxis, Other (See Comments) and Hives    Hands swell, throat swells Hands swell, throat swells No reaction listed Hives (pt clarified at office visit)   Fentanyl Nausea And Vomiting and Other (See Comments)   Imdur [Isosorbide Dinitrate] Other (See Comments)  Migraine headache    Family history: His brother had esophagus cancer.  His mother had breast cancer and a GBM  Social History:   He is retired Administrator.  [place]  He does not use cigarettes or alcohol.  ROS:   Positives include: Nocturia, nighttime leg cramps, Whitis of the back, optical migraines with mild associated headache  A complete ROS was otherwise negative.  Physical Exam:  Blood pressure 139/71, pulse (!) 49, temperature 98.2 F (36.8 C), temperature source Oral, resp. rate 18, height '5\' 7"'$  (1.702 m), weight 167 lb (75.8 kg), SpO2 100 %.  HEENT: Oropharynx without visible mass, neck  without mass Lungs: Lear bilaterally Cardiac: Irregular Abdomen: No hepatosplenomegaly  Vascular: No leg edema Lymph nodes: No cervical, supraclavicular, axillary, or inguinal nodes Neurologic: Alert and oriented, the motor exam appears grossly intact in the upper and lower extremities bilaterally Skin: No rash Musculoskeletal: No spine tenderness   LAB:  CBC  Lab Results  Component Value Date   WBC 5.7 06/15/2022   HGB 12.7 (L) 06/15/2022   HCT 38.9 (L) 06/15/2022   MCV 89.4 06/15/2022   PLT 237 06/15/2022   NEUTROABS 4.3 06/15/2022    Blood smear: The platelets appear normal in number.  No platelet clumps.  The majority the white cells are mature appearing neutrophils.  Few bands and hypolobated neutrophils.  No blasts or other young forms are seen.  The polychromasia is not increased.  No nucleated red cells.  Few ovalocytes and rare teardrop    CMP  Lab Results  Component Value Date   NA 140 02/28/2022   K 4.0 02/28/2022   CL 110 02/28/2022   CO2 26 02/28/2022   GLUCOSE 115 (H) 02/28/2022   BUN 15 02/28/2022   CREATININE 1.23 02/28/2022   CALCIUM 8.8 (L) 02/28/2022   PROT 6.4 05/03/2022   ALBUMIN 4.5 05/03/2022   AST 18 05/03/2022   ALT 17 05/03/2022   ALKPHOS 61 05/03/2022   BILITOT 0.5 05/03/2022   GFRNONAA >60 02/28/2022   GFRAA 69 05/16/2017      Assessment/Plan:   Mild normocytic anemia  Coronary artery disease, NSTEMI September 2023 GERD, laryngeal reflux History of an arrhythmia/sinus bradycardia Migraine headaches with optical component COVID-30 April 2022 COVID-30 April 2022 BPH History of a low IgA level in 2021   Disposition:   Dr. Ralene Ok is referred for evaluation of mild normocytic anemia.  The hemoglobin is slightly below the low end of normal on a CBC 06/15/2022.  The hemoglobin is slightly decreased compared to 10 years ago.  Review of his history, physical examination, and peripheral blood smear does not reveal an obvious  explanation for the mild anemia.  The differential diagnosis includes the normal decrease in hemoglobin with aging, early myelodysplasia, and less likely benign hepatic malignancy.  We will check a myeloma panel, reticulocyte count, TSH, and testosterone level.  We will repeat serum iron studies.  He is taking vitamin B12.  He will return for office visit and CBC in 6 months.  I am available to see him sooner if he has symptoms to suggest progressive anemia or has a new hematologic abnormality.  Betsy Coder, MD  06/16/2022, 4:46 PM

## 2022-06-19 ENCOUNTER — Ambulatory Visit: Payer: Medicare Other

## 2022-06-20 ENCOUNTER — Ambulatory Visit: Payer: Medicare Other

## 2022-06-20 LAB — MULTIPLE MYELOMA PANEL, SERUM
Albumin SerPl Elph-Mcnc: 3.9 g/dL (ref 2.9–4.4)
Albumin/Glob SerPl: 1.5 (ref 0.7–1.7)
Alpha 1: 0.2 g/dL (ref 0.0–0.4)
Alpha2 Glob SerPl Elph-Mcnc: 0.8 g/dL (ref 0.4–1.0)
B-Globulin SerPl Elph-Mcnc: 0.9 g/dL (ref 0.7–1.3)
Gamma Glob SerPl Elph-Mcnc: 0.9 g/dL (ref 0.4–1.8)
Globulin, Total: 2.7 g/dL (ref 2.2–3.9)
IgA: 78 mg/dL (ref 61–437)
IgG (Immunoglobin G), Serum: 809 mg/dL (ref 603–1613)
IgM (Immunoglobulin M), Srm: 96 mg/dL (ref 15–143)
Total Protein ELP: 6.6 g/dL (ref 6.0–8.5)

## 2022-06-26 ENCOUNTER — Encounter: Payer: Self-pay | Admitting: *Deleted

## 2022-06-26 NOTE — Progress Notes (Signed)
Lab not able to add iron studies to recent labs. OK to draw in July per Dr. Benay Spice.

## 2022-06-29 DIAGNOSIS — N138 Other obstructive and reflux uropathy: Secondary | ICD-10-CM | POA: Diagnosis not present

## 2022-06-29 DIAGNOSIS — G8929 Other chronic pain: Secondary | ICD-10-CM | POA: Diagnosis not present

## 2022-08-09 DIAGNOSIS — L821 Other seborrheic keratosis: Secondary | ICD-10-CM | POA: Diagnosis not present

## 2022-08-09 DIAGNOSIS — L738 Other specified follicular disorders: Secondary | ICD-10-CM | POA: Diagnosis not present

## 2022-08-09 DIAGNOSIS — L111 Transient acantholytic dermatosis [Grover]: Secondary | ICD-10-CM | POA: Diagnosis not present

## 2022-08-09 DIAGNOSIS — Z85828 Personal history of other malignant neoplasm of skin: Secondary | ICD-10-CM | POA: Diagnosis not present

## 2022-08-24 ENCOUNTER — Telehealth: Payer: Self-pay | Admitting: Cardiovascular Disease

## 2022-08-24 DIAGNOSIS — K219 Gastro-esophageal reflux disease without esophagitis: Secondary | ICD-10-CM | POA: Diagnosis not present

## 2022-08-24 DIAGNOSIS — Z800 Family history of malignant neoplasm of digestive organs: Secondary | ICD-10-CM | POA: Diagnosis not present

## 2022-08-24 DIAGNOSIS — I2510 Atherosclerotic heart disease of native coronary artery without angina pectoris: Secondary | ICD-10-CM | POA: Diagnosis not present

## 2022-08-24 DIAGNOSIS — R1310 Dysphagia, unspecified: Secondary | ICD-10-CM | POA: Diagnosis not present

## 2022-08-24 NOTE — Telephone Encounter (Signed)
[facility]  called in stating they need to know if pt is okay to hold plavix. She states pt needs to start holding today for an Endoscopy he is having on 08/29/22. [place] Please advise.

## 2022-08-24 NOTE — Telephone Encounter (Signed)
   Patient Name: Nathaniel [name]  DOB: Nov 15, 1945 MRN: [id number]  Primary Cardiologist: Sherren Mocha, MD  Chart reviewed as part of pre-operative protocol coverage. Per Dr. Burt Knack, pt may hold Plavix for 5 days prior to procedure. Pt should continue Aspirin throughout the perioperative period. Please resume Plavix as soon as possible postprocedure, at the discretion of the surgeon.   I will route this recommendation to the requesting party via Epic fax function and remove from pre-op pool.  Please call with questions.  Lenna Sciara, NP 08/24/2022, 4:57 PM

## 2022-08-24 NOTE — Telephone Encounter (Signed)
I s/w Nathaniel [name] at [facility]. I asked if she could send a clearance, Nathaniel [name] states that she s/w Nathaniel [name] in our office who said she saw the clearance request and she re-named it. I stated to Nathaniel [name] that I was just in [place] x [place] just now and I did not see the request. I asked her to resend the clearance request to my fax # [phone]. I have received the request and will forward the information to pre op APP.      Pre-operative Risk Assessment    Patient Name: Nathaniel [name]  DOB: 07-28-1945 MRN: [id number]      Request for Surgical Clearance    Procedure:   ENDOSCOPY  Date of Surgery:  Clearance 08/29/22                                 Surgeon:  DR. Paulita Fujita Surgeon's Group or Practice Name:  [facility] Phone number:  [phone] Fax number:  [phone]   Type of Clearance Requested:   - Medical  - Pharmacy:  Hold Aspirin and Clopidogrel (Plavix) x 5 DAYS PRIOR   Type of Anesthesia:  Not Indicated (PROPOFOL?)   Additional requests/questions:    [name]   08/24/2022, 4:49 PM

## 2022-08-24 NOTE — Telephone Encounter (Signed)
This is ok. He's at low risk of holding plavix for endoscopy as requested. Thank you

## 2022-08-28 ENCOUNTER — Telehealth: Payer: Self-pay | Admitting: Cardiovascular Disease

## 2022-08-28 ENCOUNTER — Ambulatory Visit: Payer: Medicare Other | Attending: Student | Admitting: Student

## 2022-08-28 ENCOUNTER — Telehealth: Payer: Self-pay | Admitting: *Deleted

## 2022-08-28 DIAGNOSIS — Z01810 Encounter for preprocedural cardiovascular examination: Secondary | ICD-10-CM

## 2022-08-28 NOTE — Telephone Encounter (Signed)
See clearance notes for further info 

## 2022-08-28 NOTE — Telephone Encounter (Signed)
Pt has been added today for tele pre op appt per pre op APP Mayra Reel, NP. Med rec and consent are done.      Patient Consent for Virtual Visit        Nathaniel [name] has provided verbal consent on 08/28/2022 for a virtual visit (video or telephone).   CONSENT FOR VIRTUAL VISIT FOR:  Nathaniel [name]  By participating in this virtual visit I agree to the following:  I hereby voluntarily request, consent and authorize [facility] and its employed or contracted physicians, physician assistants, nurse practitioners or other licensed health care professionals (the Practitioner), to provide me with telemedicine health care services (the "Services") as deemed necessary by the treating Practitioner. I acknowledge and consent to receive the Services by the Practitioner via telemedicine. I understand that the telemedicine visit will involve communicating with the Practitioner through live audiovisual communication technology and the disclosure of certain medical information by electronic transmission. I acknowledge that I have been given the opportunity to request an in-person assessment or other available alternative prior to the telemedicine visit and am voluntarily participating in the telemedicine visit.  I understand that I have the right to withhold or withdraw my consent to the use of telemedicine in the course of my care at any time, without affecting my right to future care or treatment, and that the Practitioner or I may terminate the telemedicine visit at any time. I understand that I have the right to inspect all information obtained and/or recorded in the course of the telemedicine visit and may receive copies of available information for a reasonable fee.  I understand that some of the potential risks of receiving the Services via telemedicine include:  Delay or interruption in medical evaluation due to technological equipment failure or disruption; Information  transmitted may not be sufficient (e.g. poor resolution of images) to allow for appropriate medical decision making by the Practitioner; and/or  In rare instances, security protocols could fail, causing a breach of personal health information.  Furthermore, I acknowledge that it is my responsibility to provide information about my medical history, conditions and care that is complete and accurate to the best of my ability. I acknowledge that Practitioner's advice, recommendations, and/or decision may be based on factors not within their control, such as incomplete or inaccurate data provided by me or distortions of diagnostic images or specimens that may result from electronic transmissions. I understand that the practice of medicine is not an exact science and that Practitioner makes no warranties or guarantees regarding treatment outcomes. I acknowledge that a copy of this consent can be made available to me via my patient portal ([facility]), or I can request a printed copy by calling the office of [facility].    I understand that my insurance will be billed for this visit.   I have read or had this consent read to me. I understand the contents of this consent, which adequately explains the benefits and risks of the Services being provided via telemedicine.  I have been provided ample opportunity to ask questions regarding this consent and the Services and have had my questions answered to my satisfaction. I give my informed consent for the services to be provided through the use of telemedicine in my medical care

## 2022-08-28 NOTE — Telephone Encounter (Signed)
Our office received clearance request again today. We have faxed over x 3 to requesting office.

## 2022-08-28 NOTE — Telephone Encounter (Signed)
I will forward notes back to pre op APP. Clearance will need to reflect the pt has been cleared. Clearance only states about plavix.      McNeil, Chloe R8 minutes ago (3:04 PM)   CM Freda Munro from [facility] calling back. She states she has received fax, however It does not contain medical/cardiac clearance, the clearance only mentions plavix hold. She is requesting it be sent over again.       Note   [facility] [phone]  [place] Clock

## 2022-08-28 NOTE — Progress Notes (Signed)
Virtual Visit via Telephone Note   Because of Nathaniel [name]'s co-morbid illnesses, he is at least at moderate risk for complications without adequate follow up.  This format is felt to be most appropriate for this patient at this time.  The patient did not have access to video technology/had technical difficulties with video requiring transitioning to audio format only (telephone).  All issues noted in this document were discussed and addressed.  No physical exam could be performed with this format.  Please refer to the patient's chart for his consent to telehealth for [facility].  Evaluation Performed:  Preoperative cardiovascular risk assessment _____________   Date:  08/28/2022   Patient ID:  Nathaniel [name], DOB [birth year], MRN [id number] Patient Location:  Home Provider location:   Office  Primary Care Provider:  Charlane Ferretti, MD Primary Cardiologist:  Sherren Mocha, MD  Chief Complaint / Patient Profile   77 y.o. y/o male with a h/o CAD s/p balloon angioplasty D1 September 2023, chronic microvascular angina, chronic asymptomatic bradycardia, hyperlipidemia who is pending colonoscopy by Dr. Paulita Fujita and presents today for telephonic preoperative cardiovascular risk assessment.  History of Present Illness    Nathaniel [name] is a 77 y.o. male who presents via audio/video conferencing for a telehealth visit today.  Pt was last seen in cardiology clinic on 04/24/2022 by Dr. Burt Knack.  At that time Nathaniel [name] was doing well.  The patient is now pending procedure as outlined above. Since his last visit, he is doing well from a cardiac standpoint. Patient denies shortness of breath or dyspnea on exertion. No chest pain, pressure, or tightness. Denies lower extremity edema, orthopnea, or PND. No palpitations. He reports he is less active than previously secondary to some muscle issues he is having. He enjoys golfing. He walked 18 holes about two weeks ago, and then  played another 9 holes on a different day. He also goes to a practice field and is able to practice his swing and walk the field without difficulty.   Past Medical History    Past Medical History:  Diagnosis Date   Allergic rhinitis    Anemia    pt unaware   Anginal pain ([place])    Microvascular angina- followed by Dr Burt Knack   Arthritis    back   Cancer [facility])    Basal cell -    Coronary artery disease    nonobstructive   DVT (deep venous thrombosis) ([place]) 03/2011   after prolonged travel   GERD (gastroesophageal reflux disease)    Headache    Vision Migraines   Hiatal hernia    small   History of rheumatic fever    questionable   Hyperlipidemia    Increased prostate specific antigen (PSA) velocity    Laryngopharyngeal reflux    Mitral valve prolapse    Palpitations    PE (pulmonary embolism) 03/2011   after prolonged travel   PONV (postoperative nausea and vomiting)    Fentyl- N/V   Pruritus    resolved   Shortness of breath dyspnea    with Exertion   Sinus bradycardia    Past Surgical History:  Procedure Laterality Date   COLONOSCOPY     COLONOSCOPY N/A 02/24/2015   Procedure: COLONOSCOPY;  Surgeon: Ronald Lobo, MD;  Location: [facility] ENDOSCOPY;  Service: Endoscopy;  Laterality: N/A;   CYSTOSCOPY     ESOPHAGOGASTRODUODENOSCOPY N/A 02/06/2013   Procedure: ESOPHAGOGASTRODUODENOSCOPY (EGD);  Surgeon: Cleotis Nipper, MD;  Location: [facility] ENDOSCOPY;  Service: Endoscopy;  Laterality: N/A;   ESOPHAGOGASTRODUODENOSCOPY N/A 02/24/2015   Procedure: ESOPHAGOGASTRODUODENOSCOPY (EGD);  Surgeon: Ronald Lobo, MD;  Location: [facility] ENDOSCOPY;  Service: Endoscopy;  Laterality: N/A;   HUMERUS FRACTURE SURGERY Right 05/2003   LEFT HEART CATH AND CORONARY ANGIOGRAPHY N/A 02/27/2022   Procedure: LEFT HEART CATH AND CORONARY ANGIOGRAPHY;  Surgeon: Sherren Mocha, MD;  Location: [place] CV LAB;  Service: Cardiovascular;  Laterality: N/A;   Repair of an AC seperation of the left      SHOULDER ARTHROSCOPY Left    A/C tear   TONSILLECTOMY AND ADENOIDECTOMY      Allergies  Allergies  Allergen Reactions   Penicillins Anaphylaxis, Other (See Comments) and Hives    Hands swell, throat swells Hands swell, throat swells No reaction listed Hives (pt clarified at office visit)   Fentanyl Nausea And Vomiting and Other (See Comments)   Imdur [Isosorbide Dinitrate] Other (See Comments)    Migraine headache    Home Medications    Prior to Admission medications   Medication Sig Start Date End Date Taking? Authorizing Provider  amLODipine (NORVASC) 2.5 MG tablet Take 1 tablet (2.5 mg total) by mouth daily. 05/22/22   Sherren Mocha, MD  aspirin EC 81 MG tablet Take 1 tablet (81 mg total) by mouth daily. Swallow whole. 03/08/22   Swinyer, Lanice Schwab, NP  azelastine (ASTELIN) 0.1 % nasal spray Place 1-2 sprays into both nostrils 2 (two) times daily as needed for rhinitis or allergies. 01/13/20   [provider]  cholecalciferol (VITAMIN D) 1000 UNITS tablet Take 1,000 Units by mouth every morning.     [provider]  ciclopirox (LOPROX) 0.77 % cream Apply 1 Application topically 2 (two) times daily as needed (rash). 11/03/15   [provider]  clopidogrel (PLAVIX) 75 MG tablet Take 1 tablet (75 mg total) by mouth daily with breakfast. 05/22/22   Sherren Mocha, MD  Cyanocobalamin (VITAMIN B-12) 2500 MCG SUBL Place 1 tablet under the tongue every Sunday.    [provider]  desoximetasone (TOPICORT) 0.25 % cream Apply 1 Application topically daily as needed (itching on the back). 11/03/15   [provider]  ferrous sulfate 325 (65 FE) MG EC tablet Take 325 mg by mouth every other day. Patient not taking: Reported on 06/16/2022    [provider]  Magnesium 250 MG TABS Take 500 mg by mouth at bedtime.    [provider]  mupirocin cream (BACTROBAN) 2 % Apply 1 Application topically 2 (two) times daily as needed (rash).  06/22/20   [provider]  pantoprazole (PROTONIX) 40 MG tablet Take 1 tablet (40 mg total) by mouth daily at 10 pm. 02/28/22   Cheryln Manly, NP  rosuvastatin (CRESTOR) 20 MG tablet Take 1 tablet (20 mg total) by mouth daily at 10 pm. 05/22/22   Sherren Mocha, MD  senna (SENOKOT) 8.6 MG tablet Take 1.5 tablets by mouth 2 (two) times daily.    [provider]  sildenafil (REVATIO) 20 MG tablet Take 20 mg by mouth as needed (For ED).    [provider]  tamsulosin (FLOMAX) 0.4 MG CAPS capsule Take 0.4 mg by mouth at bedtime.     [provider]    Physical Exam    Vital Signs:  Nathaniel [name] does not have vital signs available for review today.  Given telephonic nature of communication, physical exam is limited. AAOx3. NAD. Normal affect.  Speech and respirations are unlabored.  Accessory  Clinical Findings    None  Assessment & Plan    Primary Cardiologist: Sherren Mocha, MD  Preoperative cardiovascular risk assessment. Colonoscopy with Dr. Paulita Fujita.   Chart reviewed as part of pre-operative protocol coverage. According to the RCRI, patient has a 0.9% risk of MACE. Patient reports activity equivalent to 4.0 METS (golfing, walked 18 holes 2 weeks ago, played 9 holes another day, and visits a practice field).   Given past medical history and time since last visit, based on ACC/AHA guidelines, Nathaniel [name] would be at acceptable risk for the planned procedure without further cardiovascular testing.   Patient was advised that if he develops new symptoms prior to surgery to contact our office to arrange a follow-up appointment.  he verbalized understanding.  Per previous note from Diona Browner, NP: Per Dr. Burt Knack, pt may hold Plavix for 5 days prior to procedure. Pt should continue Aspirin throughout the perioperative period. Please resume Plavix as soon as possible postprocedure, at the discretion of the surgeon.   I will route this  recommendation to the requesting party via Epic fax function.  Please call with questions.  Time:   Today, I have spent 9 minutes with the patient with telehealth technology discussing medical history, symptoms, and management plan.     Mayra Reel, NP  08/28/2022, 3:42 PM

## 2022-08-28 NOTE — Telephone Encounter (Signed)
I s/w Freda Munro at [facility] and have informed her the pt has been cleared. Updated notes have been faxed over per Mayra Reel, NP. Freda Munro thanked me for the help.

## 2022-08-28 NOTE — Telephone Encounter (Signed)
Office would like a callback regarding full clearance they faxed over today due to pt having Endoscopy surgery on tomorrow 3/19. Please advise.

## 2022-08-28 NOTE — Telephone Encounter (Signed)
Pt has been added today for tele pre op appt per pre op APP Mayra Reel, NP. Med rec and consent are done.

## 2022-08-28 NOTE — Telephone Encounter (Signed)
2nd attempt, faxed over surgical clearance to [facility].

## 2022-08-28 NOTE — Telephone Encounter (Signed)
Left message for the pt that he needs a tele pre op appt today.

## 2022-08-28 NOTE — Telephone Encounter (Signed)
Nathaniel [name] from [facility] calling back. She states she has received fax, however It does not contain medical/cardiac clearance, the clearance only mentions plavix hold. She is requesting it be sent over again.

## 2022-08-28 NOTE — Telephone Encounter (Signed)
   Name: Nathaniel [name]  DOB: 1946/01/03  MRN: [id number]  Primary Cardiologist: Sherren Mocha, MD   Preoperative team, please contact this patient and set up a phone call appointment for further preoperative risk assessment. Please obtain consent and complete medication review. Thank you for your help.   Mayra Reel, NP 08/28/2022, 3:24 PM [facility]

## 2022-08-29 DIAGNOSIS — K449 Diaphragmatic hernia without obstruction or gangrene: Secondary | ICD-10-CM | POA: Diagnosis not present

## 2022-08-29 DIAGNOSIS — R1310 Dysphagia, unspecified: Secondary | ICD-10-CM | POA: Diagnosis not present

## 2022-08-29 DIAGNOSIS — K317 Polyp of stomach and duodenum: Secondary | ICD-10-CM | POA: Diagnosis not present

## 2022-08-30 DIAGNOSIS — H532 Diplopia: Secondary | ICD-10-CM | POA: Diagnosis not present

## 2022-09-29 DIAGNOSIS — R253 Fasciculation: Secondary | ICD-10-CM | POA: Diagnosis not present

## 2022-09-29 DIAGNOSIS — D649 Anemia, unspecified: Secondary | ICD-10-CM | POA: Diagnosis not present

## 2022-09-29 DIAGNOSIS — I872 Venous insufficiency (chronic) (peripheral): Secondary | ICD-10-CM | POA: Diagnosis not present

## 2022-09-29 DIAGNOSIS — N1831 Chronic kidney disease, stage 3a: Secondary | ICD-10-CM | POA: Diagnosis not present

## 2022-09-29 DIAGNOSIS — E559 Vitamin D deficiency, unspecified: Secondary | ICD-10-CM | POA: Diagnosis not present

## 2022-09-29 DIAGNOSIS — E7800 Pure hypercholesterolemia, unspecified: Secondary | ICD-10-CM | POA: Diagnosis not present

## 2022-09-29 DIAGNOSIS — I129 Hypertensive chronic kidney disease with stage 1 through stage 4 chronic kidney disease, or unspecified chronic kidney disease: Secondary | ICD-10-CM | POA: Diagnosis not present

## 2022-09-29 DIAGNOSIS — K219 Gastro-esophageal reflux disease without esophagitis: Secondary | ICD-10-CM | POA: Diagnosis not present

## 2022-10-04 DIAGNOSIS — D649 Anemia, unspecified: Secondary | ICD-10-CM | POA: Diagnosis not present

## 2022-10-12 DIAGNOSIS — L111 Transient acantholytic dermatosis [Grover]: Secondary | ICD-10-CM | POA: Diagnosis not present

## 2022-10-12 DIAGNOSIS — C44519 Basal cell carcinoma of skin of other part of trunk: Secondary | ICD-10-CM | POA: Diagnosis not present

## 2022-10-12 DIAGNOSIS — Z85828 Personal history of other malignant neoplasm of skin: Secondary | ICD-10-CM | POA: Diagnosis not present

## 2022-10-12 DIAGNOSIS — L821 Other seborrheic keratosis: Secondary | ICD-10-CM | POA: Diagnosis not present

## 2022-10-13 DIAGNOSIS — H903 Sensorineural hearing loss, bilateral: Secondary | ICD-10-CM | POA: Diagnosis not present

## 2022-10-13 DIAGNOSIS — J302 Other seasonal allergic rhinitis: Secondary | ICD-10-CM | POA: Diagnosis not present

## 2022-10-13 DIAGNOSIS — H9313 Tinnitus, bilateral: Secondary | ICD-10-CM | POA: Diagnosis not present

## 2022-10-13 DIAGNOSIS — R252 Cramp and spasm: Secondary | ICD-10-CM | POA: Diagnosis not present

## 2022-10-25 DIAGNOSIS — M778 Other enthesopathies, not elsewhere classified: Secondary | ICD-10-CM | POA: Diagnosis not present

## 2022-11-03 ENCOUNTER — Encounter: Payer: Self-pay | Admitting: Cardiovascular Disease

## 2022-11-03 ENCOUNTER — Ambulatory Visit: Payer: Medicare Other | Attending: Cardiovascular Disease | Admitting: Cardiovascular Disease

## 2022-11-03 VITALS — BP 108/58 | HR 58 | Ht 66.0 in | Wt 168.0 lb

## 2022-11-03 DIAGNOSIS — E785 Hyperlipidemia, unspecified: Secondary | ICD-10-CM

## 2022-11-03 DIAGNOSIS — R072 Precordial pain: Secondary | ICD-10-CM | POA: Diagnosis not present

## 2022-11-03 DIAGNOSIS — I25119 Atherosclerotic heart disease of native coronary artery with unspecified angina pectoris: Secondary | ICD-10-CM

## 2022-11-03 DIAGNOSIS — R001 Bradycardia, unspecified: Secondary | ICD-10-CM

## 2022-11-03 DIAGNOSIS — R7989 Other specified abnormal findings of blood chemistry: Secondary | ICD-10-CM | POA: Diagnosis not present

## 2022-11-03 NOTE — Progress Notes (Signed)
Cardiology Office Note:    Date:  11/03/2022   ID:  [name], DOB 1946-01-31, MRN [id number]  PCP:  Thana Ates, MD    [facility] Providers Cardiologist:  Tonny Bollman, MD     Referring MD: Thana Ates, MD   Chief Complaint  Patient presents with   Coronary Artery Disease    History of Present Illness:    Nathaniel [name] is a 77 y.o. male with a hx of  coronary artery disease, chronic microvascular angina, mixed hyperlipidemia, chronic bradycardia, and non-STEMI, presenting for follow-up evaluation. The patient has a longstanding history of nonobstructive CAD with typical symptoms of exertional angina felt to be related to microvascular dysfunction. He was hospitalized in 2023 with chest pain and ruled in for non-STEMI with a troponin peak of about 3000. He underwent cardiac catheterization which revealed a severe stenosis in the branch of the first diagonal had bifurcation point. This was a small caliber vessel that was treated with balloon angioplasty. His other epicardial coronaries had no high-grade obstruction. There was a bridging segment in the mid LAD which had been seen on past cardiac catheterization studies. There were otherwise no significant changes noted.   The patient is here alone today.  He is doing pretty well.  He has not been quite as active as in the past, but denies anginal symptoms at present on his current medical therapy.  Specifically denies chest pain or chest pressure.  He has mild shortness of breath with exertion which is longstanding.  No orthopnea or PND.  He has noticed worsening of varicose veins in his legs.  He is having some generalized symptoms that may be related to low testosterone.  He is undergoing evaluation for that and considering testosterone replacement.  We discussed this today in the context of his coronary artery disease.  Past Medical History:  Diagnosis Date   Allergic rhinitis    Anemia    pt unaware   Anginal  pain (HCC)    Microvascular angina- followed by Dr Excell Seltzer   Arthritis    back   Cancer [facility])    Basal cell -    Coronary artery disease    nonobstructive   DVT (deep venous thrombosis) (HCC) 03/2011   after prolonged travel   GERD (gastroesophageal reflux disease)    Headache    Vision Migraines   Hiatal hernia    small   History of rheumatic fever    questionable   Hyperlipidemia    Increased prostate specific antigen (PSA) velocity    Laryngopharyngeal reflux    Mitral valve prolapse    Palpitations    PE (pulmonary embolism) 03/2011   after prolonged travel   PONV (postoperative nausea and vomiting)    Fentyl- N/V   Pruritus    resolved   Shortness of breath dyspnea    with Exertion   Sinus bradycardia     Past Surgical History:  Procedure Laterality Date   COLONOSCOPY     COLONOSCOPY N/A 02/24/2015   Procedure: COLONOSCOPY;  Surgeon: [name], MD;  Location: [facility] ENDOSCOPY;  Service: Endoscopy;  Laterality: N/A;   CYSTOSCOPY     ESOPHAGOGASTRODUODENOSCOPY N/A 02/06/2013   Procedure: ESOPHAGOGASTRODUODENOSCOPY (EGD);  Surgeon: Florencia Reasons, MD;  Location[facility] ENDOSCOPY;  Service: Endoscopy;  Laterality: N/A;   ESOPHAGOGASTRODUODENOSCOPY N/A 02/24/2015   Procedure: ESOPHAGOGASTRODUODENOSCOPY (EGD);  Surgeon: [name], MD;  Location: [facility] ENDOSCOPY;  Service: Endoscopy;  Laterality: N/A;   HUMERUS FRACTURE SURGERY Right  05/2003   LEFT HEART CATH AND CORONARY ANGIOGRAPHY N/A 02/27/2022   Procedure: LEFT HEART CATH AND CORONARY ANGIOGRAPHY;  Surgeon: Tonny Bollman, MD;  Location: [facility] INVASIVE CV LAB;  Service: Cardiovascular;  Laterality: N/A;   Repair of an AC seperation of the left     SHOULDER ARTHROSCOPY Left    A/C tear   TONSILLECTOMY AND ADENOIDECTOMY      Current Medications: Current Meds  Medication Sig   amLODipine (NORVASC) 2.5 MG tablet Take 1 tablet (2.5 mg total) by mouth daily.   aspirin EC 81 MG tablet Take 1 tablet (81 mg total) by mouth  daily. Swallow whole.   azelastine (ASTELIN) 0.1 % nasal spray Place 1-2 sprays into both nostrils 2 (two) times daily as needed for rhinitis or allergies.   cholecalciferol (VITAMIN D) 1000 UNITS tablet Take 1,000 Units by mouth every morning.    ciclopirox (LOPROX) 0.77 % cream Apply 1 Application topically 2 (two) times daily as needed (rash).   clopidogrel (PLAVIX) 75 MG tablet Take 1 tablet (75 mg total) by mouth daily with breakfast.   Cyanocobalamin (VITAMIN B-12) 2500 MCG SUBL Place 1 tablet under the tongue every Sunday.   desoximetasone (TOPICORT) 0.25 % cream Apply 1 Application topically daily as needed (itching on the back).   famotidine (PEPCID) 40 MG tablet Take 40 mg by mouth daily.   ferrous sulfate 325 (65 FE) MG EC tablet Take 325 mg by mouth every other day.   Magnesium 250 MG TABS Take 500 mg by mouth at bedtime.   mupirocin cream (BACTROBAN) 2 % Apply 1 Application topically 2 (two) times daily as needed (rash).   omega-3 acid ethyl esters (LOVAZA) 1 g capsule Take 1 g by mouth 2 (two) times daily.   pantoprazole (PROTONIX) 40 MG tablet Take 1 tablet (40 mg total) by mouth daily at 10 pm.   rosuvastatin (CRESTOR) 20 MG tablet Take 1 tablet (20 mg total) by mouth daily at 10 pm.   senna (SENOKOT) 8.6 MG tablet Take 2 tablets by mouth 2 (two) times daily.   sildenafil (REVATIO) 20 MG tablet Take 20 mg by mouth as needed (For ED).   tamsulosin (FLOMAX) 0.4 MG CAPS capsule Take 0.4 mg by mouth at bedtime.      Allergies:   Penicillins, Fentanyl, and Imdur [isosorbide dinitrate]   Social History   Socioeconomic History   Marital status: Married    Spouse name: Not on file   Number of children: Not on file   Years of education: Not on file   Highest education level: Not on file  Occupational History   Occupation: retired  Tobacco Use   Smoking status: Never   Smokeless tobacco: Never  Substance and Sexual Activity   Alcohol use: Yes    Comment: occasional- special  ocassional   Drug use: No   Sexual activity: Not on file  Other Topics Concern   Not on file  Social History Narrative   Pt lives in [place] with wife and daughter (has autism).  Son is an anesthesia resident in [place].   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   Social Determinants of Health   Financial Resource Strain: Not on file  Food Insecurity: Not on file  Transportation Needs: Not on file  Physical Activity: Not on file  Stress: Not on file  Social Connections: Not on file     Family History: The patient's family history includes  CAD (age of onset: 29) in an other family member; Cancer in an other family member; Migraines in his mother.  ROS:   Please see the history of present illness.    All other systems reviewed and are negative.  EKGs/Labs/Other Studies Reviewed:    The following studies were reviewed today: Cardiac Studies & Procedures   CARDIAC CATHETERIZATION  CARDIAC CATHETERIZATION 02/27/2022  Narrative 1.  Patent left main with no stenosis 2.  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  Severe stenosis of the first diagonal subbranch, treated with PTCA, reducing 95% stenosis to 30% with TIMI-3 flow at the completion of the procedure and no evidence of dissection 4.  Patent left circumflex with no stenosis 5.  Patent RCA (dominant vessel) with no significant stenosis  Recommend: Continued aggressive medical therapy.  DAPT with aspirin and clopidogrel x12 months without interruption following non-STEMI.  Consider Pharm.D. clinic referral for PCSK9 inhibition to push LDL cholesterol to 55 or less  Findings Coronary Findings Diagnostic  Dominance: Right  Left Main The vessel exhibits minimal luminal irregularities.  Left Anterior Descending There is mild diffuse disease throughout the vessel. Prox LAD to Mid LAD lesion is 30% stenosed. Intramyocardial  bridging is present through this region, more prominent after administration of intracoronary nitroglycerin  Lateral First Diagonal Branch Lat 1st Diag lesion is 95% stenosed.  Left Circumflex The vessel exhibits minimal luminal irregularities.  Right Coronary Artery The vessel exhibits minimal luminal irregularities.  Intervention  Lat 1st Diag lesion Angioplasty CATH VISTA GUIDE 6FR XBLAD3.5 guide catheter was inserted. WIRE COUGAR XT STRL 190CM guidewire used to cross lesion. Balloon angioplasty was performed using a BALLN SAPPHIRE 2.0X15. Maximum pressure: 6 atm. Inflation time: 45 sec. Post-Intervention Lesion Assessment The intervention was successful. Pre-interventional TIMI flow is 3. Post-intervention TIMI flow is 3. No complications occurred at this lesion. There is a 30% residual stenosis post intervention.   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 Exam Location:  [address]  Procedure: Limited Echo, Stress Echo, Cardiac Doppler and Limited Color Doppler  Indications:    I20.8 Exertional Angina  History:        Patient has prior history of Echocardiogram examinations, most recent 05/01/2019. Palpitations, Pulmonary Embolism.  Sonographer:    Farrel Conners RDCS Referring Phys: [phone] Elick Aguilera  IMPRESSIONS   1. This is a negative stress echocardiogram for ischemia. 2. This is a low risk study.  FINDINGS  Exam Protocol: The patient exercised on a  treadmill according to a Bruce protocol.   Patient Performance: The patient exercised for 9 minutes and 45 seconds, achieving 11 METS. The maximum stage achieved was IV of the Bruce protocol. The baseline heart rate was 68 bpm. The heart rate at peak stress was 139 bpm. The target heart rate was calculated to be 123 bpm. The percentage of maximum predicted heart rate achieved was 96.0 %. The baseline blood pressure was 134/74 mmHg. The blood pressure at peak stress was 175/93 mmHg. The blood pressure response was normal. The patient developed chest pain, shortness of breath and fatigue during the stress exam.  EKG: Resting EKG showed normal sinus rhythm with no abnormal findings. The patient developed no abnormal EKG findings during exercise.   2D Echo Findings: The baseline ejection fraction was 60%. The peak ejection fraction at stress was 75%. Baseline regional wall motion abnormalities were not present. There were no stress-induced wall motion abnormalities. This is a negative stress echocardiogram for ischemia.  Additional Findings: Negative, adequate stress test. Excellent exercise capacity.   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 Exam Location:  Inpatient  Procedure: 2D Echo, Color Doppler and Cardiac Doppler  Indications:    Acute MI i21.9  History:        Patient has prior history of Echocardiogram examinations, most recent 04/25/2021. CAD; Risk  Factors:Dyslipidemia.  Sonographer:    Irving Burton Senior RDCS Sonographer#2:  Lucendia Herrlich Referring Phys: [phone] MIHAI CROITORU  IMPRESSIONS   1. Prominent false tendon in the LV apex of no clinical significance.. Left ventricular ejection fraction, by estimation, is 60 to 65%. The left ventricle has normal function. The left ventricle demonstrates regional wall motion abnormalities (see scoring diagram/findings for description). Left ventricular diastolic parameters are consistent with Grade I diastolic dysfunction (impaired relaxation). Possible very small focal area of hypokinesis of the left ventricular, apical segment but no visualized in all views. 2. Right ventricular systolic function is normal. The right ventricular size is normal. There is normal pulmonary artery systolic pressure. The estimated right ventricular systolic pressure is 25.1 mmHg. 3. The mitral valve is normal in structure. Trivial mitral valve regurgitation. No evidence of mitral stenosis. 4. The aortic valve is tricuspid. Aortic valve regurgitation is mild. Aortic valve sclerosis/calcification is present, without any evidence of aortic stenosis. 5. Aortic dilatation noted. There is borderline dilatation of the aortic root, measuring 37 mm.  There is mild dilatation of the ascending aorta, measuring 38 mm. 6. The inferior vena cava is normal in size with greater than 50% respiratory variability, suggesting right atrial pressure of 3 mmHg. 7. Recommend repeat limited study with definity contrast.  FINDINGS Left Ventricle: Prominent false tendon in the LV apex of no clinical significance. Left ventricular ejection fraction, by estimation, is 60 to 65%. The left ventricle has normal function. The left ventricle demonstrates regional wall motion abnormalities. The left ventricular internal cavity size was normal in size. There is no left ventricular hypertrophy. Left ventricular diastolic parameters are consistent with Grade I  diastolic dysfunction (impaired relaxation). Normal left ventricular filling pressure.  Right Ventricle: The right ventricular size is normal. No increase in right ventricular wall thickness. Right ventricular systolic function is normal. There is normal pulmonary artery systolic pressure. The tricuspid regurgitant velocity is 2.35 m/s, and with an assumed right atrial pressure of 3 mmHg, the estimated right ventricular systolic pressure is 25.1 mmHg.  Left Atrium: Left atrial size was normal in size.  Right Atrium: Right atrial size was normal in size.  Pericardium: There is no evidence of pericardial effusion.  Mitral Valve: The mitral valve is normal in structure. Trivial mitral valve regurgitation. No evidence of mitral valve stenosis.  Tricuspid Valve: The tricuspid valve is normal in structure. Tricuspid valve regurgitation is mild . No evidence of tricuspid stenosis.  Aortic Valve: The aortic valve is tricuspid. Aortic valve regurgitation is mild. Aortic valve sclerosis/calcification is present, without any evidence of aortic stenosis.  Pulmonic Valve: The pulmonic valve was normal in structure. Pulmonic valve regurgitation is trivial. No evidence of pulmonic stenosis.  Aorta: Aortic dilatation noted. There is borderline dilatation of the aortic root, measuring 37 mm. There is mild dilatation of the ascending aorta, measuring 38 mm.  Venous: The inferior vena cava is normal in size with greater than 50% respiratory variability, suggesting right atrial pressure of 3 mmHg.  IAS/Shunts: No atrial level shunt detected by color flow Doppler.   LEFT VENTRICLE PLAX 2D LVIDd:         4.70 cm   Diastology LVIDs:         3.40 cm   LV e' medial:    4.46 cm/s LV PW:         1.10 cm   LV E/e' medial:  12.5 LV IVS:        1.10 cm   LV e' lateral:   7.29 cm/s LVOT diam:     2.50 cm   LV E/e' lateral: 7.7 LV SV:         127 LV SV Index:   69 LVOT Area:     4.91 cm   RIGHT VENTRICLE RV  S prime:     10.00 cm/s TAPSE (M-mode): 2.6 cm  LEFT ATRIUM             Index        RIGHT ATRIUM           Index LA diam:        2.90 cm 1.58 cm/m   RA Area:     20.30 cm LA Vol (A2C):   46.7 ml 25.45 ml/m  RA Volume:   57.90 ml  31.55 ml/m LA Vol (A4C):   38.7 ml 21.09 ml/m LA Biplane Vol: 45.9 ml 25.01 ml/m AORTIC VALVE LVOT Vmax:         101.00 cm/s LVOT Vmean:        64.200  cm/s LVOT VTI:          0.259 m AR Vena Contracta: 0.30 cm  AORTA Ao Root diam: 3.70 cm Ao Asc diam:  3.80 cm  MITRAL VALVE               TRICUSPID VALVE MV Area (PHT): 1.22 cm    TR Peak grad:   22.1 mmHg MV Decel Time: 624 msec    TR Vmax:        235.00 cm/s MV E velocity: 55.80 cm/s MV A velocity: 76.10 cm/s  SHUNTS MV E/A ratio:  0.73        Systemic VTI:  0.26 m Systemic Diam: 2.50 cm  Armanda Magic MD Electronically signed by Armanda Magic MD Signature Date/Time: 02/27/2022/10:52:52 AM    Final    MONITORS  LONG TERM MONITOR (3-14 DAYS) 11/13/2021  Narrative Patch Wear Time:  13 days and 22 hours (2023-05-13T12:12:45-0400 to 2023-05-27T11:09:54-0400)  Patient had a min HR of 31 bpm, max HR of 146 bpm, and avg HR of 53 bpm. Predominant underlying rhythm was Sinus Rhythm. First Degree AV Block was present. 2 Supraventricular Tachycardia runs occurred, the run with the fastest interval lasting 9 beats with a max rate of 146 bpm (avg 135 bpm); the run with the fastest interval was also the longest. Isolated SVEs were rare (<1.0%), SVE Couplets were rare (<1.0%), and SVE Triplets were rare (<1.0%). Isolated VEs were rare (<1.0%), and no VE Couplets or VE Triplets were present.  SUMMARY: 1. The basic rhythm is normal sinus with an average HR of 53 bpm 2. No atrial fibrillation or flutter 3. No high-grade heart block 4. There are rare PVC's and rare supraventricular beats, both occurring less than 1% burden 5. The sinus rate is as low as 34 bpm without AV block or junctional beats seen 6.  Rare supraventricular runs limited to 9 beats for the longest run   CT SCANS  CT CORONARY MORPH W/CTA COR W/SCORE 08/23/2021  Addendum 08/23/2021  6:26 PM ADDENDUM REPORT: 08/23/2021 18:24  CLINICAL DATA:  Chest pain  EXAM: Cardiac/Coronary CTA  TECHNIQUE: A non-contrast, gated CT scan was obtained with axial slices of 3 mm through the heart for calcium scoring. Calcium scoring was performed using the Agatston method. A 120 kV prospective, gated, contrast cardiac scan was obtained. Gantry rotation speed was 250 msecs and collimation was 0.6 mm. Two sublingual nitroglycerin tablets (0.8 mg) were given. The 3D data set was reconstructed in 5% intervals of the 35-75% of the R-R cycle. Diastolic phases were analyzed on a dedicated workstation using MPR, MIP, and VRT modes. The patient received 95 cc of contrast.  FINDINGS: Image quality: Excellent.  Noise artifact is: Limited.  Coronary Arteries:  Normal coronary origin.  Right dominance.  Left main: The left main is a large caliber vessel with a normal take off from the left coronary cusp that bifurcates to form a left anterior descending artery and a left circumflex artery. There is mild calcified plaque in the proximal LM with associated stenosis of 25-49%.  Left anterior descending artery: The LAD is patent without evidence of plaque or stenosis. The LAD gives off 1 large branching diagonal. There is mild calcified plaque in the proximal LAD with associated stenosis of 25-49%. There is mild soft plaque in the mid LAD with associated stenosis of 25-49%.  Left circumflex artery: The LCX is non-dominant. The LCX gives off 2 patent obtuse marginal branches. There is mild calcified plaque in the  proximal LCx with associated stenosis of 25-49%.  Right coronary artery: The RCA is dominant with normal take off from the right coronary cusp. There is no evidence of plaque or stenosis. The RCA terminates as a PDA and right  posterolateral branch without evidence of plaque or stenosis.  Right Atrium: Right atrial size is within normal limits.  Right Ventricle: The right ventricular cavity is within normal limits.  Left Atrium: Left atrial size is normal in size with no left atrial appendage filling defect.  Left Ventricle: The ventricular cavity size is within normal limits. There are no stigmata of prior infarction. There is no abnormal filling defect.  Pulmonary arteries: Normal in size without proximal filling defect.  Pulmonary veins: Normal pulmonary venous drainage.  Pericardium: Normal thickness with no significant effusion or calcium present.  Cardiac valves: The aortic valve is trileaflet without significant calcification. The mitral valve is normal structure without significant calcification.  Aorta: Normal caliber with no significant disease.  Extra-cardiac findings: See attached radiology report for non-cardiac structures.  IMPRESSION: 1. Coronary calcium score of 149. This was 38th percentile for age-, sex, and race-matched controls.  2.  Normal coronary origin with right dominance.  3.  Mild atherosclerosis.  CAD RADS 2.  4.  Recommend preventive therapy and risk factor modification.  5.  Consider non atherosclerotic causes of chest pain.  RECOMMENDATIONS: 1. CAD-RADS 0: No evidence of CAD (0%). Consider non-atherosclerotic causes of chest pain.  2. CAD-RADS 1: Minimal non-obstructive CAD (0-24%). Consider non-atherosclerotic causes of chest pain. Consider preventive therapy and risk factor modification.  3. CAD-RADS 2: Mild non-obstructive CAD (25-49%). Consider non-atherosclerotic causes of chest pain. Consider preventive therapy and risk factor modification.  4. CAD-RADS 3: Moderate stenosis. Consider symptom-guided anti-ischemic pharmacotherapy as well as risk factor modification per guideline directed care. Additional analysis with CT FFR will  be submitted.  5. CAD-RADS 4: Severe stenosis. (70-99% or > 50% left main). Cardiac catheterization or CT FFR is recommended. Consider symptom-guided anti-ischemic pharmacotherapy as well as risk factor modification per guideline directed care. Invasive coronary angiography recommended with revascularization per published guideline statements.  6. CAD-RADS 5: Total coronary occlusion (100%). Consider cardiac catheterization or viability assessment. Consider symptom-guided anti-ischemic pharmacotherapy as well as risk factor modification per guideline directed care.  7. CAD-RADS N: Non-diagnostic study. Obstructive CAD can't be excluded. Alternative evaluation is recommended.  Armanda Magic, MD   Electronically Signed By: Armanda Magic M.D. On: 08/23/2021 18:24  Narrative EXAM: OVER-READ INTERPRETATION  CT CHEST  The following report is an over-read performed by radiologist Dr. Charlett Nose of [facility] on 08/23/2021. This over-read does not include interpretation of cardiac or coronary anatomy or pathology. The coronary CTA interpretation by the cardiologist is attached.  COMPARISON:  06/18/2017  FINDINGS: Vascular: Heart is normal size.  Aorta normal caliber.  Mediastinum/Nodes: No adenopathy  Lungs/Pleura: No confluent opacities or effusions.  Upper Abdomen: No acute findings  Musculoskeletal: Mild bilateral gynecomastia. No acute bony abnormality.  IMPRESSION: No acute extra cardiac abnormality.  Electronically Signed: By: Charlett Nose M.D. On: 08/23/2021 17:08   CT SCANS  CT CORONARY MORPH W/CTA COR W/SCORE 06/18/2017  Addendum 06/18/2017  6:17 PM ADDENDUM REPORT: 06/18/2017 18:15  CLINICAL DATA:  Chest pain  EXAM: Cardiac CTA  MEDICATIONS: Sub lingual nitro. 4mg   TECHNIQUE: The patient was scanned on a Siemens 192 slice scanner. Gantry rotation speed was 240 msecs. Collimation was 0.6 mm. A 100 kV prospective scan was triggered  in the  ascending thoracic aorta at 35-75% of the R-R interval. Average HR during the scan was 55 bpm. The 3D data set was interpreted on a dedicated work station using MPR, MIP and VRT modes. A total of 80cc of contrast was used.  FINDINGS: Non-cardiac: See separate report from [facility].  Calcium Score:  90 Agatston units  Coronary Arteries: Right dominant with no anomalies  LM:  Calcified plaque with mild stenosis.  LAD system: Moderate D1 without significant disease. Mixed plaque in the proximal LAD with mild stenosis. The proximal LAD then passes intramyocardially (bridging) for a short segment.  Circumflex system: Mixed plaque with mild stenosis in the proximal vessel.  RCA system:  No significant disease.  IMPRESSION: 1. Coronary artery calcium score 90 Agatston units placing the patient in the 38th percentile for age and gender.  2.  Nonobstructive coronary disease.  3.  Small intramyocardial segment in the proximal LAD.  [place] [place]   Electronically Signed By: Marca Ancona M.D. On: 06/18/2017 18:15  Narrative EXAM: OVER-READ INTERPRETATION  CT CHEST  The following report is an over-read performed by radiologist Dr. Royal Piedra [facility] on 06/18/2017. This over-read does not include interpretation of cardiac or coronary anatomy or pathology. The coronary calcium score/coronary CTA interpretation by the cardiologist is attached.  COMPARISON:  Chest CT 04/02/2011.  FINDINGS: Aortic atherosclerosis. Within the visualized portions of the thorax there are no suspicious appearing pulmonary nodules or masses, there is no acute consolidative airspace disease, no pleural effusions, no pneumothorax and no lymphadenopathy. Visualized portions of the upper abdomen are unremarkable. There are no aggressive appearing lytic or blastic lesions noted in the visualized portions of the skeleton.  IMPRESSION: 1.  Aortic Atherosclerosis  (ICD10-I70.0).  Electronically Signed: By: Trudie Reed M.D. On: 06/18/2017 10:58           EKG:  EKG is not ordered today.    Recent Labs: 06/15/2022: ALT 13; BUN 19; Creatinine 1.24; Hemoglobin 12.7; Magnesium 2.2; Platelet Count 237; Potassium 4.4; Sodium 141; TSH 1.804  Recent Lipid Panel    Component Value Date/Time   CHOL 105 05/03/2022 1052   TRIG 62 05/03/2022 1052   HDL 39 (L) 05/03/2022 1052   CHOLHDL 2.7 05/03/2022 1052   CHOLHDL 4.1 02/27/2022 0129   VLDL 27 02/27/2022 0129   LDLCALC 52 05/03/2022 1052     Risk Assessment/Calculations:                Physical Exam:    VS:  BP (!) 108/58   Pulse (!) 58   Ht 5\' 6"  (1.676 m)   Wt 168 lb (76.2 kg)   SpO2 97%   BMI 27.12 kg/m     Wt Readings from Last 3 Encounters:  11/03/22 168 lb (76.2 kg)  06/16/22 167 lb (75.8 kg)  04/24/22 169 lb 9.6 oz (76.9 kg)     GEN:  Well nourished, well developed in no acute distress HEENT: Normal NECK: No JVD; No carotid bruits LYMPHATICS: No lymphadenopathy CARDIAC: bradycardic and regular, no murmurs, rubs, gallops RESPIRATORY:  Clear to auscultation without rales, wheezing or rhonchi  ABDOMEN: Soft, non-tender, non-distended MUSCULOSKELETAL:  No edema; No deformity  SKIN: Warm and dry NEUROLOGIC:  Alert and oriented x 3 PSYCHIATRIC:  Normal affect   ASSESSMENT:    1. Coronary artery disease involving native coronary artery of native heart with angina pectoris (HCC)   2. Bradycardia   3. Hyperlipidemia LDL goal <70   4. Precordial pain  PLAN:    In order of problems listed above:  The patient is doing well on his current regimen which includes amlodipine for antianginal therapy.  He is not a candidate for beta-blocker because of resting bradycardia.  His non-STEMI event was in September 2023.  I recommend that he have a cardiac PET stress test prior to his next office visit in 6 months.  At the time of his next visit he will be out beyond 12 months from  the time of his event.  I will probably recommend discontinuation of aspirin and keep him on clopidogrel 75 mg daily. Stable, asymptomatic.  Has been evaluated by EP in the past. Last lipids with LDL cholesterol 52 after doubling his rosuvastatin dose to 20 mg daily.  Repeat lipids and LFTs.  If LDL cholesterol is greater than 60 I would be inclined to add ezetimibe.    Shared Decision Making/Informed Consent The risks [chest pain, shortness of breath, cardiac arrhythmias, dizziness, blood pressure fluctuations, myocardial infarction, stroke/transient ischemic attack, nausea, vomiting, allergic reaction, radiation exposure, metallic taste sensation and life-threatening complications (estimated to be 1 in 10,000)], benefits (risk stratification, diagnosing coronary artery disease, treatment guidance) and alternatives of a cardiac PET stress test were discussed in detail with Mr. Arline Asp and he agrees to proceed.    Medication Adjustments/Labs and Tests Ordered: Current medicines are reviewed at length with the patient today.  Concerns regarding medicines are outlined above.  Orders Placed This Encounter  Procedures   NM PET CT CARDIAC PERFUSION MULTI W/ABSOLUTE BLOODFLOW   Hepatic function panel   Lipid panel   No orders of the defined types were placed in this encounter.   Patient Instructions  Medication Instructions:  Your physician recommends that you continue on your current medications as directed. Please refer to the Current Medication list given to you today.  *If you need a refill on your cardiac medications before your next appointment, please call your pharmacy*  Lab Work: Your physician recommends that you return for lab work when you are fasting for lipid and liver panel.  If you have labs (blood work) drawn today and your tests are completely normal, you will receive your results only by: MyChart Message (if you have MyChart) OR A paper copy in the mail If you have any  lab test that is abnormal or we need to change your treatment, we will call you to review the results.  Testing/Procedures: Your physician has requested that you have cardiac  PET CT. Cardiac computed tomography (CT) is a painless test that uses an x-ray machine to take clear, detailed pictures of your heart. For further information please visit [email or web address] Please follow instruction sheet as given.  Follow-Up: At [facility], you and your health needs are our priority.  As part of our continuing mission to provide you with exceptional heart care, we have created designated Provider Care Teams.  These Care Teams include your primary Cardiologist (physician) and Advanced Practice Providers (APPs -  Physician Assistants and Nurse Practitioners) who all work together to provide you with the care you need, when you need it.  We recommend signing up for the patient portal called "MyChart".  Sign up information is provided on this After Visit Summary.  MyChart is used to connect with patients for Virtual Visits (Telemedicine).  Patients are able to view lab/test results, encounter notes, upcoming appointments, etc.  Non-urgent messages can be sent to your provider as well.   To learn more about what you  can do with MyChart, go to [email or web address].    Your next appointment:   6 month(s)  Provider:   Tonny Bollman, MD     Other Instructions How to Prepare for Your Cardiac PET/CT Stress Test:  1. Please do not take these medications before your test:   Hold Tamulosin day of test  -Medications that may interfere with the cardiac pharmacological stress agent (ex. nitrates - including erectile dysfunction medications, isosorbide mononitrate, tamulosin or beta-blockers) the day of the exam.  Sildenafil  -Erectile dysfunction medication should be held for at least 72 hrs prior to test. Your remaining medications may be taken with water.  2. Nothing to eat or drink, except water,  3 hours prior to arrival time.   NO caffeine/decaffeinated products, or chocolate 12 hours prior to arrival.  3. NO perfume, cologne or lotion  4. Total time is 1 to 2 hours; you may want to bring reading material for the waiting time.  5. Please report to Radiology at the [facility] Main Entrance 30 minutes early for your test.  [address] [place], [place] [address]  IF YOU THINK YOU MAY BE PREGNANT, OR ARE NURSING PLEASE INFORM THE TECHNOLOGIST.  In preparation for your appointment, medication and supplies will be purchased.  Appointment availability is limited, so if you need to cancel or reschedule, please call the Radiology Department at [phone]  24 hours in advance to avoid a cancellation fee of $100.00  What to Expect After you Arrive:  Once you arrive and check in for your appointment, you will be taken to a preparation room within the Radiology Department.  A technologist or Nurse will obtain your medical history, verify that you are correctly prepped for the exam, and explain the procedure.  Afterwards,  an IV will be started in your arm and electrodes will be placed on your skin for EKG monitoring during the stress portion of the exam. Then you will be escorted to the PET/CT scanner.  There, staff will get you positioned on the scanner and obtain a blood pressure and EKG.  During the exam, you will continue to be connected to the EKG and blood pressure machines.  A small, safe amount of a radioactive tracer will be injected in your IV to obtain a series of pictures of your heart along with an injection of a stress agent.    After your Exam:  It is recommended that you eat a meal and drink a caffeinated beverage to counter act any effects of the stress agent.  Drink plenty of fluids for the remainder of the day and urinate frequently for the first couple of hours after the exam.  Your doctor will inform you of your test results within 7-10 business days.  ET/CT  scanner.  There, staff will get you positioned on the scanner and obtain a blood pressure and EKG.  During the exam, you will continue to be connected to the EKG and blood pressure machines.  A small, safe amount of a radioactive tracer will be injected in your IV to obtain a series of pictures of your heart along with an injection of a stress agent.    After your Exam:  It is recommended that you eat a meal and drink a caffeinated beverage to counter act any effects of the stress agent.  Drink plenty of fluids for the remainder of the day and urinate frequently for the first couple of hours after the exam.  Your doctor will  inform you of your test results within 7-10 business days.  For questions about your test or how to prepare for your test, please call: Rockwell Alexandria, Cardiac Imaging Nurse Navigator  Larey Brick, Cardiac Imaging Nurse Navigator Office: [phone]    Signed, Tonny Bollman, MD  11/03/2022 4:33 PM    [facility]

## 2022-11-03 NOTE — Patient Instructions (Addendum)
Medication Instructions:  Your physician recommends that you continue on your current medications as directed. Please refer to the Current Medication list given to you today.  *If you need a refill on your cardiac medications before your next appointment, please call your pharmacy*  Lab Work: Your physician recommends that you return for lab work when you are fasting for lipid and liver panel.  If you have labs (blood work) drawn today and your tests are completely normal, you will receive your results only by: MyChart Message (if you have MyChart) OR A paper copy in the mail If you have any lab test that is abnormal or we need to change your treatment, we will call you to review the results.  Testing/Procedures: Your physician has requested that you have cardiac  PET CT. Cardiac computed tomography (CT) is a painless test that uses an x-ray machine to take clear, detailed pictures of your heart. For further information please visit [email or web address] Please follow instruction sheet as given.  Follow-Up: At [facility], you and your health needs are our priority.  As part of our continuing mission to provide you with exceptional heart care, we have created designated Provider Care Teams.  These Care Teams include your primary Cardiologist (physician) and Advanced Practice Providers (APPs -  Physician Assistants and Nurse Practitioners) who all work together to provide you with the care you need, when you need it.  We recommend signing up for the patient portal called "MyChart".  Sign up information is provided on this After Visit Summary.  MyChart is used to connect with patients for Virtual Visits (Telemedicine).  Patients are able to view lab/test results, encounter notes, upcoming appointments, etc.  Non-urgent messages can be sent to your provider as well.   To learn more about what you can do with MyChart, go to [email or web address].    Your next appointment:   6  month(s)  Provider:   Tonny Bollman, MD     Other Instructions How to Prepare for Your Cardiac PET/CT Stress Test:  1. Please do not take these medications before your test:   Hold Tamulosin day of test  -Medications that may interfere with the cardiac pharmacological stress agent (ex. nitrates - including erectile dysfunction medications, isosorbide mononitrate, tamulosin or beta-blockers) the day of the exam.  Sildenafil  -Erectile dysfunction medication should be held for at least 72 hrs prior to test. Your remaining medications may be taken with water.  2. Nothing to eat or drink, except water, 3 hours prior to arrival time.   NO caffeine/decaffeinated products, or chocolate 12 hours prior to arrival.  3. NO perfume, cologne or lotion  4. Total time is 1 to 2 hours; you may want to bring reading material for the waiting time.  5. Please report to Radiology at the [facility] Main Entrance 30 minutes early for your test.  [address] [place], [place] [address]  IF YOU THINK YOU MAY BE PREGNANT, OR ARE NURSING PLEASE INFORM THE TECHNOLOGIST.  In preparation for your appointment, medication and supplies will be purchased.  Appointment availability is limited, so if you need to cancel or reschedule, please call the Radiology Department at [phone]  24 hours in advance to avoid a cancellation fee of $100.00  What to Expect After you Arrive:  Once you arrive and check in for your appointment, you will be taken to a preparation room within the Radiology Department.  A technologist or Nurse will obtain your medical history, verify  that you are correctly prepped for the exam, and explain the procedure.  Afterwards,  an IV will be started in your arm and electrodes will be placed on your skin for EKG monitoring during the stress portion of the exam. Then you will be escorted to the PET/CT scanner.  There, staff will get you positioned on the scanner and obtain a blood  pressure and EKG.  During the exam, you will continue to be connected to the EKG and blood pressure machines.  A small, safe amount of a radioactive tracer will be injected in your IV to obtain a series of pictures of your heart along with an injection of a stress agent.    After your Exam:  It is recommended that you eat a meal and drink a caffeinated beverage to counter act any effects of the stress agent.  Drink plenty of fluids for the remainder of the day and urinate frequently for the first couple of hours after the exam.  Your doctor will inform you of your test results within 7-10 business days.  ET/CT scanner.  There, staff will get you positioned on the scanner and obtain a blood pressure and EKG.  During the exam, you will continue to be connected to the EKG and blood pressure machines.  A small, safe amount of a radioactive tracer will be injected in your IV to obtain a series of pictures of your heart along with an injection of a stress agent.    After your Exam:  It is recommended that you eat a meal and drink a caffeinated beverage to counter act any effects of the stress agent.  Drink plenty of fluids for the remainder of the day and urinate frequently for the first couple of hours after the exam.  Your doctor will inform you of your test results within 7-10 business days.  For questions about your test or how to prepare for your test, please call: Rockwell Alexandria, Cardiac Imaging Nurse Navigator  Larey Brick, Cardiac Imaging Nurse Navigator Office: [phone]

## 2022-11-08 ENCOUNTER — Encounter: Payer: Self-pay | Admitting: Cardiovascular Disease

## 2022-11-13 ENCOUNTER — Ambulatory Visit: Payer: Medicare Other | Attending: Cardiovascular Disease

## 2022-11-13 DIAGNOSIS — E785 Hyperlipidemia, unspecified: Secondary | ICD-10-CM

## 2022-11-13 DIAGNOSIS — R072 Precordial pain: Secondary | ICD-10-CM | POA: Diagnosis not present

## 2022-11-13 DIAGNOSIS — I25119 Atherosclerotic heart disease of native coronary artery with unspecified angina pectoris: Secondary | ICD-10-CM | POA: Diagnosis not present

## 2022-11-13 DIAGNOSIS — R001 Bradycardia, unspecified: Secondary | ICD-10-CM | POA: Diagnosis not present

## 2022-11-14 DIAGNOSIS — D649 Anemia, unspecified: Secondary | ICD-10-CM | POA: Diagnosis not present

## 2022-11-14 LAB — LIPID PANEL
Chol/HDL Ratio: 2.7 ratio (ref 0.0–5.0)
Cholesterol, Total: 116 mg/dL (ref 100–199)
HDL: 43 mg/dL (ref >39)
LDL Chol Calc (NIH): 61 mg/dL (ref 0–99)
Triglycerides: 55 mg/dL (ref 0–149)
VLDL Cholesterol Cal: 12 mg/dL (ref 5–40)

## 2022-11-14 LAB — HEPATIC FUNCTION PANEL
ALT: 16 IU/L (ref 0–44)
AST: 24 IU/L (ref 0–40)
Albumin: 4.2 g/dL (ref 3.8–4.8)
Alkaline Phosphatase: 50 IU/L (ref 44–121)
Bilirubin Total: 0.7 mg/dL (ref 0.0–1.2)
Bilirubin, Direct: 0.18 mg/dL (ref 0.00–0.40)
Total Protein: 6.4 g/dL (ref 6.0–8.5)

## 2022-11-16 ENCOUNTER — Telehealth: Payer: Self-pay

## 2022-11-16 DIAGNOSIS — Z79899 Other long term (current) drug therapy: Secondary | ICD-10-CM

## 2022-11-16 DIAGNOSIS — E785 Hyperlipidemia, unspecified: Secondary | ICD-10-CM

## 2022-11-16 NOTE — Telephone Encounter (Signed)
Spoke with patient who agrees to no med changes at this time and repeating labs in 6 months. Lab orders placed and scheduled.

## 2022-11-16 NOTE — Telephone Encounter (Signed)
-----   Message from Tonny Bollman, MD sent at 11/14/2022  3:45 PM EDT ----- Reviewed and excellent lipid results. We had discussed aiming for an LDL < 55. With LDL 61, I'm inclined to continue current Rx. I'm not sure we gain much by adding Zetia here. Recommend repeat labs 6 months. thx

## 2022-11-20 ENCOUNTER — Telehealth: Payer: Self-pay | Admitting: Cardiovascular Disease

## 2022-11-20 DIAGNOSIS — Q245 Malformation of coronary vessels: Secondary | ICD-10-CM | POA: Diagnosis not present

## 2022-11-20 DIAGNOSIS — Z743 Need for continuous supervision: Secondary | ICD-10-CM | POA: Diagnosis not present

## 2022-11-20 DIAGNOSIS — R001 Bradycardia, unspecified: Secondary | ICD-10-CM | POA: Diagnosis not present

## 2022-11-20 DIAGNOSIS — I10 Essential (primary) hypertension: Secondary | ICD-10-CM | POA: Diagnosis not present

## 2022-11-20 DIAGNOSIS — E785 Hyperlipidemia, unspecified: Secondary | ICD-10-CM | POA: Diagnosis not present

## 2022-11-20 DIAGNOSIS — R9431 Abnormal electrocardiogram [ECG] [EKG]: Secondary | ICD-10-CM | POA: Diagnosis not present

## 2022-11-20 DIAGNOSIS — I517 Cardiomegaly: Secondary | ICD-10-CM | POA: Diagnosis not present

## 2022-11-20 DIAGNOSIS — Z7982 Long term (current) use of aspirin: Secondary | ICD-10-CM | POA: Diagnosis not present

## 2022-11-20 DIAGNOSIS — I252 Old myocardial infarction: Secondary | ICD-10-CM | POA: Diagnosis not present

## 2022-11-20 DIAGNOSIS — R079 Chest pain, unspecified: Secondary | ICD-10-CM | POA: Diagnosis not present

## 2022-11-20 DIAGNOSIS — I440 Atrioventricular block, first degree: Secondary | ICD-10-CM | POA: Diagnosis not present

## 2022-11-20 DIAGNOSIS — Z7902 Long term (current) use of antithrombotics/antiplatelets: Secondary | ICD-10-CM | POA: Diagnosis not present

## 2022-11-20 DIAGNOSIS — I2510 Atherosclerotic heart disease of native coronary artery without angina pectoris: Secondary | ICD-10-CM | POA: Diagnosis not present

## 2022-11-20 DIAGNOSIS — Z880 Allergy status to penicillin: Secondary | ICD-10-CM | POA: Diagnosis not present

## 2022-11-20 DIAGNOSIS — Z8679 Personal history of other diseases of the circulatory system: Secondary | ICD-10-CM | POA: Diagnosis not present

## 2022-11-20 DIAGNOSIS — Z79899 Other long term (current) drug therapy: Secondary | ICD-10-CM | POA: Diagnosis not present

## 2022-11-20 DIAGNOSIS — R0789 Other chest pain: Secondary | ICD-10-CM | POA: Diagnosis not present

## 2022-11-20 NOTE — Telephone Encounter (Signed)
Pt called to let Dr. Excell Seltzer know that he was visiting his son in [place] and in the middle of the night he called EMS for pressure type pain in his chest which is new for him.   In the ED his EKG and Troponins have been "normal" according to him... he has been admitted and waiting for a Nuc Med Stress test and if abnormal planning a LHC this Friday.   He is at [facility] in [place], [place].   I advised him to get well and I will forward to Dr Excell Seltzer for his review and to be sure to sign a medical release for his med rec to be sent to Dr Excell Seltzer when he is being discharged.   Pt will call if he needs [place].

## 2022-11-20 NOTE — Telephone Encounter (Signed)
Pt would like a callback regarding being admitted into the hospital in [place]. Pleased advise

## 2022-11-21 DIAGNOSIS — R079 Chest pain, unspecified: Secondary | ICD-10-CM | POA: Diagnosis not present

## 2022-11-21 DIAGNOSIS — R0789 Other chest pain: Secondary | ICD-10-CM | POA: Diagnosis not present

## 2022-11-21 DIAGNOSIS — R001 Bradycardia, unspecified: Secondary | ICD-10-CM | POA: Diagnosis not present

## 2022-11-21 DIAGNOSIS — Z8679 Personal history of other diseases of the circulatory system: Secondary | ICD-10-CM | POA: Diagnosis not present

## 2022-11-21 DIAGNOSIS — I2510 Atherosclerotic heart disease of native coronary artery without angina pectoris: Secondary | ICD-10-CM | POA: Diagnosis not present

## 2022-11-21 DIAGNOSIS — E785 Hyperlipidemia, unspecified: Secondary | ICD-10-CM | POA: Diagnosis not present

## 2022-11-22 ENCOUNTER — Telehealth: Payer: Self-pay | Admitting: Cardiovascular Disease

## 2022-11-22 DIAGNOSIS — R079 Chest pain, unspecified: Secondary | ICD-10-CM | POA: Diagnosis not present

## 2022-11-22 DIAGNOSIS — R9439 Abnormal result of other cardiovascular function study: Secondary | ICD-10-CM | POA: Diagnosis not present

## 2022-11-22 DIAGNOSIS — Z9889 Other specified postprocedural states: Secondary | ICD-10-CM | POA: Diagnosis not present

## 2022-11-22 DIAGNOSIS — R001 Bradycardia, unspecified: Secondary | ICD-10-CM | POA: Diagnosis not present

## 2022-11-22 DIAGNOSIS — Z8679 Personal history of other diseases of the circulatory system: Secondary | ICD-10-CM | POA: Diagnosis not present

## 2022-11-22 LAB — LAB REPORT - SCANNED: POC INR: 1.1

## 2022-11-22 NOTE — Telephone Encounter (Signed)
Patient stated he is still in the hospital in [place] and wanted to let Dr. Excell Seltzer know he will be having a heart cath today and can contact his wife at [phone].

## 2022-11-22 NOTE — Telephone Encounter (Signed)
Patient called to report he has been discharged from hospital.  Patient has visit scheduled on 6/20.

## 2022-11-22 NOTE — Telephone Encounter (Signed)
Returned call to wife Arline Asp to let her know that Dr Excell Seltzer is on vacation this week, but if after cath is completed and pt is discharged, if they want follow-up appt with Excell Seltzer, to call back and I'll arrange. Wife appreciative of call.

## 2022-11-26 ENCOUNTER — Encounter: Payer: Self-pay | Admitting: Cardiovascular Disease

## 2022-11-29 ENCOUNTER — Telehealth: Payer: Self-pay | Admitting: Cardiovascular Disease

## 2022-11-29 DIAGNOSIS — R531 Weakness: Secondary | ICD-10-CM | POA: Diagnosis not present

## 2022-11-29 DIAGNOSIS — M791 Myalgia, unspecified site: Secondary | ICD-10-CM | POA: Diagnosis not present

## 2022-11-29 MED ORDER — NITROGLYCERIN 0.4 MG SL SUBL: SUBLINGUAL_TABLET | SUBLINGUAL | 6 refills | Status: AC

## 2022-11-29 NOTE — Telephone Encounter (Signed)
Pt states he was supposed to have a rx for Nitroglycerin called in but it has not been added to his med list yet. Please advise if this can be sent to [facility] on file.

## 2022-11-29 NOTE — Telephone Encounter (Signed)
Returned call to patient who states he did just pick up NTG rx from pharmacy. No further questions.

## 2022-11-30 ENCOUNTER — Encounter: Payer: Self-pay | Admitting: Cardiovascular Disease

## 2022-11-30 ENCOUNTER — Other Ambulatory Visit: Payer: Self-pay | Admitting: Cardiovascular Disease

## 2022-11-30 ENCOUNTER — Ambulatory Visit: Payer: Medicare Other | Attending: Cardiovascular Disease | Admitting: Cardiovascular Disease

## 2022-11-30 VITALS — BP 118/60 | HR 45 | Ht 66.0 in | Wt 166.6 lb

## 2022-11-30 DIAGNOSIS — E782 Mixed hyperlipidemia: Secondary | ICD-10-CM

## 2022-11-30 DIAGNOSIS — I25119 Atherosclerotic heart disease of native coronary artery with unspecified angina pectoris: Secondary | ICD-10-CM

## 2022-11-30 DIAGNOSIS — R001 Bradycardia, unspecified: Secondary | ICD-10-CM

## 2022-11-30 LAB — BASIC METABOLIC PANEL
BUN/Creatinine Ratio: 18 (ref 10–24)
BUN: 25 mg/dL (ref 8–27)
CO2: 28 mmol/L (ref 20–29)
Calcium: 9.3 mg/dL (ref 8.6–10.2)
Chloride: 104 mmol/L (ref 96–106)
Creatinine, Ser: 1.4 mg/dL — ABNORMAL HIGH (ref 0.76–1.27)
Glucose: 98 mg/dL (ref 70–99)
Potassium: 4.6 mmol/L (ref 3.5–5.2)
Sodium: 140 mmol/L (ref 134–144)
eGFR: 52 mL/min/{1.73_m2} — ABNORMAL LOW (ref >59)

## 2022-11-30 LAB — TROPONIN T: Troponin T (Highly Sensitive): 13 ng/L (ref 0–22)

## 2022-11-30 MED ORDER — RANOLAZINE ER 500 MG PO TB12: 500.0000 mg | ORAL_TABLET | Freq: Two times a day (BID) | ORAL | 3 refills | Status: DC

## 2022-11-30 NOTE — Patient Instructions (Signed)
Medication Instructions:  Your physician has recommended you make the following change in your medication:   Start Renexa 500 mg 2 times daily *If you need a refill on your cardiac medications before your next appointment, please call your pharmacy*   Lab Work: Troponin  at lab downstairs at [facility] If you have labs (blood work) drawn today and your tests are completely normal, you will receive your results only by: MyChart Message (if you have MyChart) OR A paper copy in the mail If you have any lab test that is abnormal or we need to change your treatment, we will call you to review the results.   Testing/Procedures: CT-scan of the chest    Follow-Up: At [facility], you and your health needs are our priority.  As part of our continuing mission to provide you with exceptional heart care, we have created designated Provider Care Teams.  These Care Teams include your primary Cardiologist (physician) and Advanced Practice Providers (APPs -  Physician Assistants and Nurse Practitioners) who all work together to provide you with the care you need, when you need it.   Your next appointment:   4 week(s)  Provider:   Tonny Bollman, MD

## 2022-11-30 NOTE — Progress Notes (Signed)
Cardiology Office Note:    Date:  11/30/2022   ID:  KADARRIUS HOWEY, DOB 1946/06/04, MRN [id number]  PCP:  Thana Ates, MD   [facility] Providers Cardiologist:  Tonny Bollman, MD     Referring MD: Thana Ates, MD   Chief Complaint  Patient presents with   Coronary Artery Disease    History of Present Illness:    NDREW PIKE is a 77 y.o. male with a hx of coronary artery disease, chronic microvascular angina, mixed hyperlipidemia, chronic bradycardia, and non-STEMI, presenting for follow-up evaluation. The patient has a longstanding history of nonobstructive CAD with typical symptoms of exertional angina felt to be related to microvascular dysfunction. He was hospitalized in 2023 with chest pain and ruled in for non-STEMI with a troponin peak of about 3000. He underwent cardiac catheterization which revealed a severe stenosis in the branch of the first diagonal had bifurcation point. This was a small caliber vessel that was treated with balloon angioplasty. His other epicardial coronaries had no high-grade obstruction. There was a bridging segment in the mid LAD which had been seen on past cardiac catheterization studies. There were otherwise no significant changes noted.  The patient was seen in follow-up Nov 03, 2022 and was noted to be doing well at that time.  However, he was recently traveling in the [place] area when he developed substernal chest discomfort while he was carrying some luggage.  He feels a dull retrosternal pain.  He went to the emergency room where he waited for several hours.  Cardiac troponins were negative.  He ultimately underwent stress testing with a nuclear study.  I do not have results of that but it was reportedly abnormal.  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  The patient's LVEDP was normal.  There is mention in the cath report to consider a CTA to better define the aortic size and rule out aneurysm.  The patient is continue to struggle with chest discomfort.  He is added onto my schedule today for hospital follow-up.  He continues to experience substernal discomfort that is mostly noticeable at rest or with low-level activity.  When he exerts himself he really does not appreciate any worsening in his symptoms.  There is no shortness of breath.  His heart rate continues to be slow but he does not have lightheadedness or syncope.  Past Medical History:  Diagnosis Date   Allergic rhinitis    Anemia    pt unaware   Anginal pain (HCC)    Microvascular angina- followed by Dr Excell Seltzer   Arthritis    back   Cancer [facility])    Basal cell -    Coronary artery disease    nonobstructive   DVT (deep venous thrombosis) (HCC) 03/2011   after prolonged travel   GERD (gastroesophageal reflux disease)    Headache    Vision Migraines   Hiatal hernia    small   History of rheumatic fever    questionable   Hyperlipidemia    Increased prostate specific antigen (PSA) velocity    Laryngopharyngeal reflux    Mitral valve prolapse    Palpitations    PE (pulmonary embolism) 03/2011   after prolonged travel   PONV (postoperative nausea and vomiting)  Fentyl- N/V   Pruritus    resolved   Shortness of breath dyspnea    with Exertion   Sinus bradycardia     Past Surgical History:  Procedure Laterality Date   COLONOSCOPY     COLONOSCOPY N/A 02/24/2015   Procedure: COLONOSCOPY;  Surgeon: [name], MD;  Location: [facility] ENDOSCOPY;  Service: Endoscopy;  Laterality: N/A;   CYSTOSCOPY     ESOPHAGOGASTRODUODENOSCOPY N/A 02/06/2013   Procedure: ESOPHAGOGASTRODUODENOSCOPY (EGD);  Surgeon: Florencia Reasons, MD;  Location: [facility] ENDOSCOPY;  Service: Endoscopy;  Laterality: N/A;    ESOPHAGOGASTRODUODENOSCOPY N/A 02/24/2015   Procedure: ESOPHAGOGASTRODUODENOSCOPY (EGD);  Surgeon: [name], MD;  Location: [facility] ENDOSCOPY;  Service: Endoscopy;  Laterality: N/A;   HUMERUS FRACTURE SURGERY Right 05/2003   LEFT HEART CATH AND CORONARY ANGIOGRAPHY N/A 02/27/2022   Procedure: LEFT HEART CATH AND CORONARY ANGIOGRAPHY;  Surgeon: Tonny Bollman, MD;  Location: [facility] INVASIVE CV LAB;  Service: Cardiovascular;  Laterality: N/A;   Repair of an AC seperation of the left     SHOULDER ARTHROSCOPY Left    A/C tear   TONSILLECTOMY AND ADENOIDECTOMY      Current Medications: Current Meds  Medication Sig   amLODipine (NORVASC) 2.5 MG tablet Take 1 tablet (2.5 mg total) by mouth daily.   aspirin EC 81 MG tablet Take 1 tablet (81 mg total) by mouth daily. Swallow whole.   azelastine (ASTELIN) 0.1 % nasal spray Place 1-2 sprays into both nostrils 2 (two) times daily as needed for rhinitis or allergies.   cholecalciferol (VITAMIN D) 1000 UNITS tablet Take 1,000 Units by mouth every morning.    ciclopirox (LOPROX) 0.77 % cream Apply 1 Application topically 2 (two) times daily as needed (rash).   clopidogrel (PLAVIX) 75 MG tablet Take 1 tablet (75 mg total) by mouth daily with breakfast.   Cyanocobalamin (VITAMIN B-12) 2500 MCG SUBL Place 1 tablet under the tongue every Sunday.   desoximetasone (TOPICORT) 0.25 % cream Apply 1 Application topically daily as needed (itching on the back).   ferrous sulfate 325 (65 FE) MG EC tablet Take 325 mg by mouth every other day.   Magnesium 250 MG TABS Take 500 mg by mouth at bedtime.   mupirocin cream (BACTROBAN) 2 % Apply 1 Application topically 2 (two) times daily as needed (rash).   nitroGLYCERIN (NITROSTAT) 0.4 MG SL tablet Dissolve 1 tablet under the tongue every 5 minutes as needed for chest pain. Max of 3 doses, then 911.   omega-3 acid ethyl esters (LOVAZA) 1 g capsule Take 1 g by mouth 2 (two) times daily.   pantoprazole (PROTONIX) 40 MG tablet  Take 1 tablet (40 mg total) by mouth daily at 10 pm.   ranolazine (RANEXA) 500 MG 12 hr tablet Take 1 tablet (500 mg total) by mouth 2 (two) times daily.   rosuvastatin (CRESTOR) 20 MG tablet Take 1 tablet (20 mg total) by mouth daily at 10 pm.   senna (SENOKOT) 8.6 MG tablet Take 2 tablets by mouth 2 (two) times daily.   sildenafil (REVATIO) 20 MG tablet Take 20 mg by mouth as needed (For ED).   tamsulosin (FLOMAX) 0.4 MG CAPS capsule Take 0.4 mg by mouth at bedtime.      Allergies:   Penicillins, Fentanyl, and Imdur [isosorbide dinitrate]   Social History   Socioeconomic History   Marital status: Married    Spouse name: Not on file   Number of children: Not on file   Years of education: Not on  file   Highest education level: Not on file  Occupational History   Occupation: retired  Tobacco Use   Smoking status: Never   Smokeless tobacco: Never  Substance and Sexual Activity   Alcohol use: Yes    Comment: occasional- special ocassional   Drug use: No   Sexual activity: Not on file  Other Topics Concern   Not on file  Social History Narrative   Pt lives in [place] with wife and daughter (has autism).  Son is an anesthesia resident in [place].   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   Social Determinants of Health   Financial Resource Strain: Not on file  Food Insecurity: Not on file  Transportation Needs: Not on file  Physical Activity: Not on file  Stress: Not on file  Social Connections: Not on file     Family History: The patient's family history includes CAD (age of onset: 67) in an other family member; Cancer in an other family member; Migraines in his mother.  ROS:   Please see the history of present illness.    All other systems reviewed and are negative.  EKGs/Labs/Other Studies Reviewed:    The following studies were reviewed today: Cardiac Studies & Procedures   CARDIAC CATHETERIZATION  CARDIAC CATHETERIZATION  02/27/2022  Narrative 1.  Patent left main with no stenosis 2.  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  Severe stenosis of the first diagonal subbranch, treated with PTCA, reducing 95% stenosis to 30% with TIMI-3 flow at the completion of the procedure and no evidence of dissection 4.  Patent left circumflex with no stenosis 5.  Patent RCA (dominant vessel) with no significant stenosis  Recommend: Continued aggressive medical therapy.  DAPT with aspirin and clopidogrel x12 months without interruption following non-STEMI.  Consider Pharm.D. clinic referral for PCSK9 inhibition to push LDL cholesterol to 55 or less  Findings Coronary Findings Diagnostic  Dominance: Right  Left Main The vessel exhibits minimal luminal irregularities.  Left Anterior Descending There is mild diffuse disease throughout the vessel. Prox LAD to Mid LAD lesion is 30% stenosed. Intramyocardial bridging is present through this region, more prominent after administration of intracoronary nitroglycerin  Lateral First Diagonal Branch Lat 1st Diag lesion is 95% stenosed.  Left Circumflex The vessel exhibits minimal luminal irregularities.  Right Coronary Artery The vessel exhibits minimal luminal irregularities.  Intervention  Lat 1st Diag lesion Angioplasty CATH VISTA GUIDE 6FR XBLAD3.5 guide catheter was inserted. WIRE COUGAR XT STRL 190CM guidewire used to cross lesion. Balloon angioplasty was performed using a BALLN SAPPHIRE 2.0X15. Maximum pressure: 6 atm. Inflation time: 45 sec. Post-Intervention Lesion Assessment The intervention was successful. Pre-interventional TIMI flow is 3. Post-intervention TIMI flow is 3. No complications occurred at this lesion. There is a 30% residual stenosis post intervention.   STRESS TESTS  ECHOCARDIOGRAM STRESS TEST 04/13/2020  Narrative EXERCISE STRESS ECHO  REPORT   --------------------------------------------------------------------------------  Patient Name:   DR. Samul Dada Date of Exam: 04/13/2020 Medical Rec #:  [id number]             Height:       68.0 in Accession #:    [id number]            Weight:       170.0 lb Date of Birth:  Oct 12, 1945             BSA:  1.907 m Patient Age:    74 years              BP:           134/74 mmHg Patient Gender: M                     HR:           68 bpm. Exam Location:  [address]  Procedure: Limited Echo, Stress Echo, Cardiac Doppler and Limited Color Doppler  Indications:    I20.8 Exertional Angina  History:        Patient has prior history of Echocardiogram examinations, most recent 05/01/2019. Palpitations, Pulmonary Embolism.  Sonographer:    Farrel Conners RDCS Referring Phys: [phone] Sherman Donaldson  IMPRESSIONS   1. This is a negative stress echocardiogram for ischemia. 2. This is a low risk study.  FINDINGS  Exam Protocol: The patient exercised on a treadmill according to a Bruce protocol.   Patient Performance: The patient exercised for 9 minutes and 45 seconds, achieving 11 METS. The maximum stage achieved was IV of the Bruce protocol. The baseline heart rate was 68 bpm. The heart rate at peak stress was 139 bpm. The target heart rate was calculated to be 123 bpm. The percentage of maximum predicted heart rate achieved was 96.0 %. The baseline blood pressure was 134/74 mmHg. The blood pressure at peak stress was 175/93 mmHg. The blood pressure response was normal. The patient developed chest pain, shortness of breath and fatigue during the stress exam.  EKG: Resting EKG showed normal sinus rhythm with no abnormal findings. The patient developed no abnormal EKG findings during exercise.   2D Echo Findings: The baseline ejection fraction was 60%. The peak ejection fraction at stress was 75%. Baseline regional wall motion abnormalities were not present. There were no  stress-induced wall motion abnormalities. This is a negative stress echocardiogram for ischemia.  Additional Findings: Negative, adequate stress test. Excellent exercise capacity.   Chilton Si MD Electronically signed on 04/13/2020 at 5:26:02 PM     Final   ECHOCARDIOGRAM  ECHOCARDIOGRAM COMPLETE 02/27/2022  Narrative ECHOCARDIOGRAM REPORT    Patient Name:   DR. Samul Dada Date of Exam: 02/27/2022 Medical Rec #:  [id number]             Height:       66.0 in Accession #:    [id number]            Weight:       163.4 lb Date of Birth:  12/18/1945             BSA:          1.835 m Patient Age:    6 years              BP:           130/61 mmHg Patient Gender: M                     HR:           42 bpm. Exam Location:  Inpatient  Procedure: 2D Echo, Color Doppler and Cardiac Doppler  Indications:    Acute MI i21.9  History:        Patient has prior history of Echocardiogram examinations, most recent 04/25/2021. CAD; Risk Factors:Dyslipidemia.  Sonographer:    Irving Burton Senior RDCS Sonographer#2:  Lucendia Herrlich Referring Phys: [phone] MIHAI CROITORU  IMPRESSIONS   1. Prominent false  tendon in the LV apex of no clinical significance.. Left ventricular ejection fraction, by estimation, is 60 to 65%. The left ventricle has normal function. The left ventricle demonstrates regional wall motion abnormalities (see scoring diagram/findings for description). Left ventricular diastolic parameters are consistent with Grade I diastolic dysfunction (impaired relaxation). Possible very small focal area of hypokinesis of the left ventricular, apical segment but no visualized in all views. 2. Right ventricular systolic function is normal. The right ventricular size is normal. There is normal pulmonary artery systolic pressure. The estimated right ventricular systolic pressure is 25.1 mmHg. 3. The mitral valve is normal in structure. Trivial mitral valve regurgitation. No evidence of  mitral stenosis. 4. The aortic valve is tricuspid. Aortic valve regurgitation is mild. Aortic valve sclerosis/calcification is present, without any evidence of aortic stenosis. 5. Aortic dilatation noted. There is borderline dilatation of the aortic root, measuring 37 mm. There is mild dilatation of the ascending aorta, measuring 38 mm. 6. The inferior vena cava is normal in size with greater than 50% respiratory variability, suggesting right atrial pressure of 3 mmHg. 7. Recommend repeat limited study with definity contrast.  FINDINGS Left Ventricle: Prominent false tendon in the LV apex of no clinical significance. Left ventricular ejection fraction, by estimation, is 60 to 65%. The left ventricle has normal function. The left ventricle demonstrates regional wall motion abnormalities. The left ventricular internal cavity size was normal in size. There is no left ventricular hypertrophy. Left ventricular diastolic parameters are consistent with Grade I diastolic dysfunction (impaired relaxation). Normal left ventricular filling pressure.  Right Ventricle: The right ventricular size is normal. No increase in right ventricular wall thickness. Right ventricular systolic function is normal. There is normal pulmonary artery systolic pressure. The tricuspid regurgitant velocity is 2.35 m/s, and with an assumed right atrial pressure of 3 mmHg, the estimated right ventricular systolic pressure is 25.1 mmHg.  Left Atrium: Left atrial size was normal in size.  Right Atrium: Right atrial size was normal in size.  Pericardium: There is no evidence of pericardial effusion.  Mitral Valve: The mitral valve is normal in structure. Trivial mitral valve regurgitation. No evidence of mitral valve stenosis.  Tricuspid Valve: The tricuspid valve is normal in structure. Tricuspid valve regurgitation is mild . No evidence of tricuspid stenosis.  Aortic Valve: The aortic valve is tricuspid. Aortic valve  regurgitation is mild. Aortic valve sclerosis/calcification is present, without any evidence of aortic stenosis.  Pulmonic Valve: The pulmonic valve was normal in structure. Pulmonic valve regurgitation is trivial. No evidence of pulmonic stenosis.  Aorta: Aortic dilatation noted. There is borderline dilatation of the aortic root, measuring 37 mm. There is mild dilatation of the ascending aorta, measuring 38 mm.  Venous: The inferior vena cava is normal in size with greater than 50% respiratory variability, suggesting right atrial pressure of 3 mmHg.  IAS/Shunts: No atrial level shunt detected by color flow Doppler.   LEFT VENTRICLE PLAX 2D LVIDd:         4.70 cm   Diastology LVIDs:         3.40 cm   LV e' medial:    4.46 cm/s LV PW:         1.10 cm   LV E/e' medial:  12.5 LV IVS:        1.10 cm   LV e' lateral:   7.29 cm/s LVOT diam:     2.50 cm   LV E/e' lateral: 7.7 LV SV:  127 LV SV Index:   69 LVOT Area:     4.91 cm   RIGHT VENTRICLE RV S prime:     10.00 cm/s TAPSE (M-mode): 2.6 cm  LEFT ATRIUM             Index        RIGHT ATRIUM           Index LA diam:        2.90 cm 1.58 cm/m   RA Area:     20.30 cm LA Vol (A2C):   46.7 ml 25.45 ml/m  RA Volume:   57.90 ml  31.55 ml/m LA Vol (A4C):   38.7 ml 21.09 ml/m LA Biplane Vol: 45.9 ml 25.01 ml/m AORTIC VALVE LVOT Vmax:         101.00 cm/s LVOT Vmean:        64.200 cm/s LVOT VTI:          0.259 m AR Vena Contracta: 0.30 cm  AORTA Ao Root diam: 3.70 cm Ao Asc diam:  3.80 cm  MITRAL VALVE               TRICUSPID VALVE MV Area (PHT): 1.22 cm    TR Peak grad:   22.1 mmHg MV Decel Time: 624 msec    TR Vmax:        235.00 cm/s MV E velocity: 55.80 cm/s MV A velocity: 76.10 cm/s  SHUNTS MV E/A ratio:  0.73        Systemic VTI:  0.26 m Systemic Diam: 2.50 cm  Armanda Magic MD Electronically signed by Armanda Magic MD Signature Date/Time: 02/27/2022/10:52:52 AM    Final    MONITORS  LONG TERM MONITOR  (3-14 DAYS) 11/13/2021  Narrative Patch Wear Time:  13 days and 22 hours (2023-05-13T12:12:45-0400 to 2023-05-27T11:09:54-0400)  Patient had a min HR of 31 bpm, max HR of 146 bpm, and avg HR of 53 bpm. Predominant underlying rhythm was Sinus Rhythm. First Degree AV Block was present. 2 Supraventricular Tachycardia runs occurred, the run with the fastest interval lasting 9 beats with a max rate of 146 bpm (avg 135 bpm); the run with the fastest interval was also the longest. Isolated SVEs were rare (<1.0%), SVE Couplets were rare (<1.0%), and SVE Triplets were rare (<1.0%). Isolated VEs were rare (<1.0%), and no VE Couplets or VE Triplets were present.  SUMMARY: 1. The basic rhythm is normal sinus with an average HR of 53 bpm 2. No atrial fibrillation or flutter 3. No high-grade heart block 4. There are rare PVC's and rare supraventricular beats, both occurring less than 1% burden 5. The sinus rate is as low as 34 bpm without AV block or junctional beats seen 6. Rare supraventricular runs limited to 9 beats for the longest run   CT SCANS  CT CORONARY MORPH W/CTA COR W/SCORE 08/23/2021  Addendum 08/23/2021  6:26 PM ADDENDUM REPORT: 08/23/2021 18:24  CLINICAL DATA:  Chest pain  EXAM: Cardiac/Coronary CTA  TECHNIQUE: A non-contrast, gated CT scan was obtained with axial slices of 3 mm through the heart for calcium scoring. Calcium scoring was performed using the Agatston method. A 120 kV prospective, gated, contrast cardiac scan was obtained. Gantry rotation speed was 250 msecs and collimation was 0.6 mm. Two sublingual nitroglycerin tablets (0.8 mg) were given. The 3D data set was reconstructed in 5% intervals of the 35-75% of the R-R cycle. Diastolic phases were analyzed on a dedicated workstation using MPR, MIP, and VRT modes. The  patient received 95 cc of contrast.  FINDINGS: Image quality: Excellent.  Noise artifact is: Limited.  Coronary Arteries:  Normal coronary origin.   Right dominance.  Left main: The left main is a large caliber vessel with a normal take off from the left coronary cusp that bifurcates to form a left anterior descending artery and a left circumflex artery. There is mild calcified plaque in the proximal LM with associated stenosis of 25-49%.  Left anterior descending artery: The LAD is patent without evidence of plaque or stenosis. The LAD gives off 1 large branching diagonal. There is mild calcified plaque in the proximal LAD with associated stenosis of 25-49%. There is mild soft plaque in the mid LAD with associated stenosis of 25-49%.  Left circumflex artery: The LCX is non-dominant. The LCX gives off 2 patent obtuse marginal branches. There is mild calcified plaque in the proximal LCx with associated stenosis of 25-49%.  Right coronary artery: The RCA is dominant with normal take off from the right coronary cusp. There is no evidence of plaque or stenosis. The RCA terminates as a PDA and right posterolateral branch without evidence of plaque or stenosis.  Right Atrium: Right atrial size is within normal limits.  Right Ventricle: The right ventricular cavity is within normal limits.  Left Atrium: Left atrial size is normal in size with no left atrial appendage filling defect.  Left Ventricle: The ventricular cavity size is within normal limits. There are no stigmata of prior infarction. There is no abnormal filling defect.  Pulmonary arteries: Normal in size without proximal filling defect.  Pulmonary veins: Normal pulmonary venous drainage.  Pericardium: Normal thickness with no significant effusion or calcium present.  Cardiac valves: The aortic valve is trileaflet without significant calcification. The mitral valve is normal structure without significant calcification.  Aorta: Normal caliber with no significant disease.  Extra-cardiac findings: See attached radiology report for non-cardiac  structures.  IMPRESSION: 1. Coronary calcium score of 149. This was 38th percentile for age-, sex, and race-matched controls.  2.  Normal coronary origin with right dominance.  3.  Mild atherosclerosis.  CAD RADS 2.  4.  Recommend preventive therapy and risk factor modification.  5.  Consider non atherosclerotic causes of chest pain.  RECOMMENDATIONS: 1. CAD-RADS 0: No evidence of CAD (0%). Consider non-atherosclerotic causes of chest pain.  2. CAD-RADS 1: Minimal non-obstructive CAD (0-24%). Consider non-atherosclerotic causes of chest pain. Consider preventive therapy and risk factor modification.  3. CAD-RADS 2: Mild non-obstructive CAD (25-49%). Consider non-atherosclerotic causes of chest pain. Consider preventive therapy and risk factor modification.  4. CAD-RADS 3: Moderate stenosis. Consider symptom-guided anti-ischemic pharmacotherapy as well as risk factor modification per guideline directed care. Additional analysis with CT FFR will be submitted.  5. CAD-RADS 4: Severe stenosis. (70-99% or > 50% left main). Cardiac catheterization or CT FFR is recommended. Consider symptom-guided anti-ischemic pharmacotherapy as well as risk factor modification per guideline directed care. Invasive coronary angiography recommended with revascularization per published guideline statements.  6. CAD-RADS 5: Total coronary occlusion (100%). Consider cardiac catheterization or viability assessment. Consider symptom-guided anti-ischemic pharmacotherapy as well as risk factor modification per guideline directed care.  7. CAD-RADS N: Non-diagnostic study. Obstructive CAD can't be excluded. Alternative evaluation is recommended.  Armanda Magic, MD   Electronically Signed By: Armanda Magic M.D. On: 08/23/2021 18:24  Narrative EXAM: OVER-READ INTERPRETATION  CT CHEST  The following report is an over-read performed by radiologist Dr. Charlett Nose of [facility] on  08/23/2021.  This over-read does not include interpretation of cardiac or coronary anatomy or pathology. The coronary CTA interpretation by the cardiologist is attached.  COMPARISON:  06/18/2017  FINDINGS: Vascular: Heart is normal size.  Aorta normal caliber.  Mediastinum/Nodes: No adenopathy  Lungs/Pleura: No confluent opacities or effusions.  Upper Abdomen: No acute findings  Musculoskeletal: Mild bilateral gynecomastia. No acute bony abnormality.  IMPRESSION: No acute extra cardiac abnormality.  Electronically Signed: By: Charlett Nose M.D. On: 08/23/2021 17:08   CT SCANS  CT CORONARY MORPH W/CTA COR W/SCORE 06/18/2017  Addendum 06/18/2017  6:17 PM ADDENDUM REPORT: 06/18/2017 18:15  CLINICAL DATA:  Chest pain  EXAM: Cardiac CTA  MEDICATIONS: Sub lingual nitro. 4mg   TECHNIQUE: The patient was scanned on a Siemens 192 slice scanner. Gantry rotation speed was 240 msecs. Collimation was 0.6 mm. A 100 kV prospective scan was triggered in the ascending thoracic aorta at 35-75% of the R-R interval. Average HR during the scan was 55 bpm. The 3D data set was interpreted on a dedicated work station using MPR, MIP and VRT modes. A total of 80cc of contrast was used.  FINDINGS: Non-cardiac: See separate report from [facility].  Calcium Score:  90 Agatston units  Coronary Arteries: Right dominant with no anomalies  LM:  Calcified plaque with mild stenosis.  LAD system: Moderate D1 without significant disease. Mixed plaque in the proximal LAD with mild stenosis. The proximal LAD then passes intramyocardially (bridging) for a short segment.  Circumflex system: Mixed plaque with mild stenosis in the proximal vessel.  RCA system:  No significant disease.  IMPRESSION: 1. Coronary artery calcium score 90 Agatston units placing the patient in the 38th percentile for age and gender.  2.  Nonobstructive coronary disease.  3.  Small intramyocardial segment in  the proximal LAD.  Dalton Mclean   Electronically Signed By: Marca Ancona M.D. On: 06/18/2017 18:15  Narrative EXAM: OVER-READ INTERPRETATION  CT CHEST  The following report is an over-read performed by radiologist Dr. [facility] on 06/18/2017. This over-read does not include interpretation of cardiac or coronary anatomy or pathology. The coronary calcium score/coronary CTA interpretation by the cardiologist is attached.  COMPARISON:  Chest CT 04/02/2011.  FINDINGS: Aortic atherosclerosis. Within the visualized portions of the thorax there are no suspicious appearing pulmonary nodules or masses, there is no acute consolidative airspace disease, no pleural effusions, no pneumothorax and no lymphadenopathy. Visualized portions of the upper abdomen are unremarkable. There are no aggressive appearing lytic or blastic lesions noted in the visualized portions of the skeleton.  IMPRESSION: 1.  Aortic Atherosclerosis (ICD10-I70.0).  Electronically Signed: By: Trudie Reed M.D. On: 06/18/2017 10:58               Recent Labs: 06/15/2022: BUN 19; Creatinine 1.24; Hemoglobin 12.7; Magnesium 2.2; Platelet Count 237; Potassium 4.4; Sodium 141; TSH 1.804 11/13/2022: ALT 16  Recent Lipid Panel    Component Value Date/Time   CHOL 116 11/13/2022 1255   TRIG 55 11/13/2022 1255   HDL 43 11/13/2022 1255   CHOLHDL 2.7 11/13/2022 1255   CHOLHDL 4.1 02/27/2022 0129   VLDL 27 02/27/2022 0129   LDLCALC 61 11/13/2022 1255     Risk Assessment/Calculations:              Physical Exam:    VS:  BP 118/60   Pulse (!) 45   Ht 5\' 6"  (1.676 m)   Wt 166 lb 9.6 oz (75.6 kg)   SpO2 98%  BMI 26.89 kg/m     Wt Readings from Last 3 Encounters:  11/30/22 166 lb 9.6 oz (75.6 kg)  11/03/22 168 lb (76.2 kg)  06/16/22 167 lb (75.8 kg)     GEN:  Well nourished, well developed in no acute distress HEENT: Normal NECK: No JVD; No carotid  bruits LYMPHATICS: No lymphadenopathy CARDIAC: RRR, no murmurs, rubs, gallops RESPIRATORY:  Clear to auscultation without rales, wheezing or rhonchi  ABDOMEN: Soft, non-tender, non-distended MUSCULOSKELETAL:  No edema; No deformity  SKIN: Warm and dry NEUROLOGIC:  Alert and oriented x 3 PSYCHIATRIC:  Normal affect   ASSESSMENT:    1. Coronary artery disease involving native coronary artery of native heart with angina pectoris (HCC)   2. Mixed hyperlipidemia   3. 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  It is not clear to me if he is having microvascular angina or noncardiac pain.  It seems apparent that he does not have high-grade obstructive epicardial disease based on his heart catheterization that was just performed on June 10.  I am going to send for the cath films so that I can review them and compare them to his prior studies.  I have recommended a CTA of the chest to evaluate for any aortic pathology such as an acute aortic syndrome which I think is unlikely.  I am going to start him on ranolazine 500 mg twice daily for antianginal therapy.  He is not a candidate for any AV nodal blocking agents because of his bradycardia.  He remains on low-dose amlodipine with good control of his blood pressure.  A troponin will be checked today as well since he continues to have chest discomfort. Continue current therapy with a high intensity statin drug. Remains asymptomatic.  Continue to follow.  I will arrange close outpatient follow-up to assess his response to ranolazine and I will review his CTA when completed.  Will also review cath films when we receive them.      Medication Adjustments/Labs and Tests Ordered: Current medicines are reviewed at length with the patient today.  Concerns regarding medicines are outlined above.  Orders  Placed This Encounter  Procedures   CT ANGIO CHEST AORTA W/CM & OR WO/CM   Troponin T   Basic metabolic panel   EKG 12-Lead   Meds ordered this encounter  Medications   ranolazine (RANEXA) 500 MG 12 hr tablet    Sig: Take 1 tablet (500 mg total) by mouth 2 (two) times daily.    Dispense:  180 tablet    Refill:  3    Patient Instructions  Medication Instructions:  Your physician has recommended you make the following change in your medication:   Start Renexa 500 mg 2 times daily *If you need a refill on your cardiac medications before your next appointment, please call your pharmacy*   Lab Work: Troponin  at lab downstairs at [facility] If you have labs (blood work) drawn today and your tests are completely normal, you will receive your results only by: MyChart Message (if you have MyChart) OR A paper copy in the mail If you have any lab test that is abnormal or we need to change your treatment, we will call you to review the results.   Testing/Procedures: CT-scan of the chest    Follow-Up: At [facility], you and  your health needs are our priority.  As part of our continuing mission to provide you with exceptional heart care, we have created designated Provider Care Teams.  These Care Teams include your primary Cardiologist (physician) and Advanced Practice Providers (APPs -  Physician Assistants and Nurse Practitioners) who all work together to provide you with the care you need, when you need it.   Your next appointment:   4 week(s)  Provider:   Tonny Bollman, MD        Signed, Tonny Bollman, MD  11/30/2022 5:35 PM    [facility]

## 2022-12-01 ENCOUNTER — Telehealth: Payer: Self-pay

## 2022-12-01 DIAGNOSIS — R748 Abnormal levels of other serum enzymes: Secondary | ICD-10-CM

## 2022-12-01 NOTE — Telephone Encounter (Signed)
-----   Message from Tonny Bollman, MD sent at 12/01/2022 11:08 AM EDT ----- HS-troponin normal. This is reassuring. BUN/creatinine mildly elevated. Please ask him to push fluids. Would repeat BMET in 2 weeks. Thank you

## 2022-12-01 NOTE — Telephone Encounter (Signed)
Called and spoke with patient who agrees to repeat BMET. He's already scheduled to have blood work done 12/15/22 at [facility] for anemia workup. Added BMET order and he will ask them to draw this as well.

## 2022-12-05 ENCOUNTER — Ambulatory Visit (HOSPITAL_BASED_OUTPATIENT_CLINIC_OR_DEPARTMENT_OTHER)
Admission: RE | Admit: 2022-12-05 | Discharge: 2022-12-05 | Disposition: A | Discharge status: Home / Self Care | Payer: Medicare Other | Source: Ambulatory Visit | Attending: Cardiovascular Disease | Admitting: Cardiovascular Disease

## 2022-12-05 DIAGNOSIS — E782 Mixed hyperlipidemia: Secondary | ICD-10-CM | POA: Diagnosis not present

## 2022-12-05 DIAGNOSIS — R001 Bradycardia, unspecified: Secondary | ICD-10-CM | POA: Insufficient documentation

## 2022-12-05 DIAGNOSIS — I25119 Atherosclerotic heart disease of native coronary artery with unspecified angina pectoris: Secondary | ICD-10-CM | POA: Diagnosis not present

## 2022-12-05 DIAGNOSIS — R079 Chest pain, unspecified: Secondary | ICD-10-CM | POA: Diagnosis not present

## 2022-12-05 MED ORDER — IOHEXOL 350 MG/ML SOLN
100.0000 mL | Freq: Once | INTRAVENOUS | Status: AC | PRN
  Administered 2022-12-05: 80 mL via INTRAVENOUS

## 2022-12-07 ENCOUNTER — Encounter: Payer: Self-pay | Admitting: Cardiovascular Disease

## 2022-12-07 DIAGNOSIS — G8929 Other chronic pain: Secondary | ICD-10-CM | POA: Diagnosis not present

## 2022-12-07 DIAGNOSIS — N138 Other obstructive and reflux uropathy: Secondary | ICD-10-CM | POA: Diagnosis not present

## 2022-12-14 ENCOUNTER — Other Ambulatory Visit: Payer: Self-pay | Admitting: Nurse Practitioner

## 2022-12-15 ENCOUNTER — Encounter: Payer: Self-pay | Admitting: *Deleted

## 2022-12-15 ENCOUNTER — Other Ambulatory Visit: Payer: Self-pay | Admitting: *Deleted

## 2022-12-15 ENCOUNTER — Other Ambulatory Visit: Payer: Self-pay | Admitting: Cardiovascular Disease

## 2022-12-15 ENCOUNTER — Inpatient Hospital Stay: Payer: Medicare Other | Attending: Oncology

## 2022-12-15 ENCOUNTER — Inpatient Hospital Stay: Payer: Medicare Other | Admitting: Oncology

## 2022-12-15 ENCOUNTER — Telehealth: Payer: Self-pay | Admitting: Oncology

## 2022-12-15 VITALS — BP 108/59 | HR 49 | Temp 98.1°F | Resp 18 | Ht 66.0 in | Wt 167.4 lb

## 2022-12-15 DIAGNOSIS — K219 Gastro-esophageal reflux disease without esophagitis: Secondary | ICD-10-CM | POA: Diagnosis not present

## 2022-12-15 DIAGNOSIS — R252 Cramp and spasm: Secondary | ICD-10-CM | POA: Diagnosis not present

## 2022-12-15 DIAGNOSIS — Z862 Personal history of diseases of the blood and blood-forming organs and certain disorders involving the immune mechanism: Secondary | ICD-10-CM | POA: Diagnosis not present

## 2022-12-15 DIAGNOSIS — I2510 Atherosclerotic heart disease of native coronary artery without angina pectoris: Secondary | ICD-10-CM | POA: Diagnosis not present

## 2022-12-15 DIAGNOSIS — I252 Old myocardial infarction: Secondary | ICD-10-CM | POA: Diagnosis not present

## 2022-12-15 DIAGNOSIS — D649 Anemia, unspecified: Secondary | ICD-10-CM | POA: Insufficient documentation

## 2022-12-15 DIAGNOSIS — N400 Enlarged prostate without lower urinary tract symptoms: Secondary | ICD-10-CM | POA: Diagnosis not present

## 2022-12-15 DIAGNOSIS — R748 Abnormal levels of other serum enzymes: Secondary | ICD-10-CM | POA: Diagnosis not present

## 2022-12-15 LAB — CBC WITH DIFFERENTIAL (CANCER CENTER ONLY)
Abs Immature Granulocytes: 0 10*3/uL (ref 0.00–0.07)
Basophils Absolute: 0 10*3/uL (ref 0.0–0.1)
Basophils Relative: 1 %
Eosinophils Absolute: 0.1 10*3/uL (ref 0.0–0.5)
Eosinophils Relative: 3 %
HCT: 38.7 % — ABNORMAL LOW (ref 39.0–52.0)
Hemoglobin: 12.9 g/dL — ABNORMAL LOW (ref 13.0–17.0)
Immature Granulocytes: 0 %
Lymphocytes Relative: 14 %
Lymphs Abs: 0.7 10*3/uL (ref 0.7–4.0)
MCH: 30 pg (ref 26.0–34.0)
MCHC: 33.3 g/dL (ref 30.0–36.0)
MCV: 90 fL (ref 80.0–100.0)
Monocytes Absolute: 0.4 10*3/uL (ref 0.1–1.0)
Monocytes Relative: 8 %
Neutro Abs: 3.4 10*3/uL (ref 1.7–7.7)
Neutrophils Relative %: 74 %
Platelet Count: 213 10*3/uL (ref 150–400)
RBC: 4.3 MIL/uL (ref 4.22–5.81)
RDW: 13.3 % (ref 11.5–15.5)
WBC Count: 4.6 10*3/uL (ref 4.0–10.5)
nRBC: 0 % (ref 0.0–0.2)

## 2022-12-15 LAB — IRON AND TIBC
Iron: 62 ug/dL (ref 45–182)
Saturation Ratios: 17 % — ABNORMAL LOW (ref 17.9–39.5)
TIBC: 375 ug/dL (ref 250–450)
UIBC: 313 ug/dL

## 2022-12-15 LAB — FERRITIN: Ferritin: 37 ng/mL (ref 24–336)

## 2022-12-15 NOTE — Progress Notes (Signed)
  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  There was no clear explanation for the anemia.  A myeloma panel was negative.  He generally feels well.  He continues to have intermittent leg cramps at night.  No bleeding.  He was admitted in [place] with chest pain approximately 1 month ago.  He reports he ruled out for a myocardial infarction.  Objective:  Vital signs in last 24 hours:  Blood pressure (!) 108/59, pulse (!) 49, temperature 98.1 F (36.7 C), temperature source Oral, resp. rate 18, height 5\' 6"  (1.676 m), weight 167 lb 6.4 oz (75.9 kg), SpO2 99 %.    Lymphatics: No cervical, supraclavicular, axillary, or inguinal nodes Resp: Lungs clear bilaterally Cardio: Regular rhythm with premature beats?,  Bradycardia GI: No hepatosplenomegaly Vascular: No leg edema  Lab Results:  Lab Results  Component Value Date   WBC 4.6 12/15/2022   HGB 12.9 (L) 12/15/2022   HCT 38.7 (L) 12/15/2022   MCV 90.0 12/15/2022   PLT 213 12/15/2022   NEUTROABS 3.4 12/15/2022    CMP  Lab Results  Component Value Date   NA 140 11/30/2022   K 4.6 11/30/2022   CL 104 11/30/2022   CO2 28 11/30/2022   GLUCOSE 98 11/30/2022   BUN 25 11/30/2022   CREATININE 1.40 (H) 11/30/2022   CALCIUM 9.3 11/30/2022   PROT 6.4 11/13/2022   ALBUMIN 4.2 11/13/2022   AST 24 11/13/2022   ALT 16 11/13/2022   ALKPHOS 50 11/13/2022   BILITOT 0.7 11/13/2022   GFRNONAA 60 (L) 06/15/2022   GFRAA 69 05/16/2017    Medications: I have reviewed the patient's current medications.   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  He has persistent mild normocytic anemia.  The anemia is likely related to a benign normal variant at his age.  He could have early myelodysplasia.  I have a low suspicion for a hematopoietic malignancy.  We will follow-up on the iron studies and testosterone level from today.  He will return for an office visit and CBC in 8 months.  Thornton Papas, MD  12/15/2022  10:33 AM

## 2022-12-15 NOTE — Telephone Encounter (Signed)
Scheduled appointment per 7/5 los. Left voicemail. 

## 2022-12-16 LAB — BASIC METABOLIC PANEL
BUN/Creatinine Ratio: 15 (ref 10–24)
BUN: 20 mg/dL (ref 8–27)
CO2: 23 mmol/L (ref 20–29)
Calcium: 9.1 mg/dL (ref 8.6–10.2)
Chloride: 106 mmol/L (ref 96–106)
Creatinine, Ser: 1.33 mg/dL — ABNORMAL HIGH (ref 0.76–1.27)
Glucose: 110 mg/dL — ABNORMAL HIGH (ref 70–99)
Potassium: 4.1 mmol/L (ref 3.5–5.2)
Sodium: 139 mmol/L (ref 134–144)
eGFR: 55 mL/min/{1.73_m2} — ABNORMAL LOW (ref >59)

## 2022-12-16 LAB — TESTOSTERONE: Testosterone: 335 ng/dL (ref 264–916)

## 2022-12-18 DIAGNOSIS — M778 Other enthesopathies, not elsewhere classified: Secondary | ICD-10-CM | POA: Diagnosis not present

## 2023-01-22 ENCOUNTER — Telehealth: Payer: Self-pay | Admitting: Vascular Surgery

## 2023-01-22 NOTE — Telephone Encounter (Signed)
Pt wanted follow up appt with Dr. Edilia Bo

## 2023-02-02 ENCOUNTER — Telehealth: Payer: Self-pay | Admitting: Cardiovascular Disease

## 2023-02-02 NOTE — Telephone Encounter (Signed)
Returned pt's call. Pt has been having chest pain off and on for the past week. Pain states it feels like tightening and pressure. Pt states its worse at night. Episodes last 10-20 minutes. He does not and has not taken his nitroglycerin as it gives him headaches. Pt stated during our conversion of times the pain started while leaning over and laying on left side. Spoke with pt ~30 minutes. Pt is currently in [place] and will not be back until a day or two before his scheduled appt with Dr Excell Seltzer next week. Told pt to try taking his nitroglycerin when he had chest pain for relief and we would reach out to him if Dr.Cooper and any other recommendations before his appointment.    Spoke with Dr Excell Seltzer about Pt and he said he had no additional recommendations at this time and to keep follow up appointment as scheduled.

## 2023-02-02 NOTE — Telephone Encounter (Signed)
   Pt c/o of Chest Pain: STAT if active CP, including tightness, pressure, jaw pain, radiating pain to shoulder/upper arm/back, CP unrelieved by Nitro. Symptoms reported of SOB, nausea, vomiting, sweating.  1. Are you having CP right now? No      2. Are you experiencing any other symptoms (ex. SOB, nausea, vomiting, sweating)?  No    3. Is your CP continuous or coming and going? Coming and going   4. Have you taken Nitroglycerin? No    5. How long have you been experiencing CP? Last couple of weeks    6. If NO CP at time of call then end call with telling Pt to call back or call 911 if Chest pain returns prior to return call from triage team.

## 2023-02-09 ENCOUNTER — Ambulatory Visit: Payer: Medicare Other | Attending: Cardiovascular Disease | Admitting: Cardiovascular Disease

## 2023-02-09 ENCOUNTER — Encounter: Payer: Self-pay | Admitting: Cardiovascular Disease

## 2023-02-09 VITALS — BP 120/70 | HR 40 | Ht 66.0 in | Wt 165.4 lb

## 2023-02-09 DIAGNOSIS — I25119 Atherosclerotic heart disease of native coronary artery with unspecified angina pectoris: Secondary | ICD-10-CM

## 2023-02-09 DIAGNOSIS — R002 Palpitations: Secondary | ICD-10-CM

## 2023-02-09 DIAGNOSIS — E782 Mixed hyperlipidemia: Secondary | ICD-10-CM | POA: Diagnosis not present

## 2023-02-09 MED ORDER — RANOLAZINE ER 500 MG PO TB12: 500.0000 mg | ORAL_TABLET | Freq: Two times a day (BID) | ORAL | 3 refills | Status: DC

## 2023-02-09 NOTE — Patient Instructions (Signed)
Medication Instructions:  Ranexa/Ranolazine 500mg  twice daily *If you need a refill on your cardiac medications before your next appointment, please call your pharmacy*  Lab Work: NONE If you have labs (blood work) drawn today and your tests are completely normal, you will receive your results only by: MyChart Message (if you have MyChart) OR A paper copy in the mail If you have any lab test that is abnormal or we need to change your treatment, we will call you to review the results.  Testing/Procedures: Cardiac PET scan Your physician has requested that you have cardiac CT. Cardiac computed tomography (CT) is a painless test that uses an x-ray machine to take clear, detailed pictures of your heart. For further information please visit [email or web address] Please follow instruction sheet as given.   Follow-Up: At [facility], you and your health needs are our priority.  As part of our continuing mission to provide you with exceptional heart care, we have created designated Provider Care Teams.  These Care Teams include your primary Cardiologist (physician) and Advanced Practice Providers (APPs -  Physician Assistants and Nurse Practitioners) who all work together to provide you with the care you need, when you need it.  Your next appointment:   3 month(s)  Provider:   Tonny Bollman, MD       Other Instructions How to Prepare for Your Cardiac PET/CT Stress Test:  1. Please do not take these medications before your test:   Medications that may interfere with the cardiac pharmacological stress agent (ex. nitrates - including erectile dysfunction medications, isosorbide mononitrate, tamulosin or beta-blockers) the day of the exam. (Erectile dysfunction medication should be held for at least 72 hrs prior to test) Theophylline containing medications for 12 hours. Dipyridamole 48 hours prior to the test. Your remaining medications may be taken with water.  2. Nothing to eat or  drink, except water, 3 hours prior to arrival time.   NO caffeine/decaffeinated products, or chocolate 12 hours prior to arrival.  3. NO perfume, cologne or lotion  4. Total time is 1 to 2 hours; you may want to bring reading material for the waiting time.  5. Please report to Radiology at the [facility] Main Entrance 30 minutes early for your test.  [address] [place], [place] [address]  6. Please report to Radiology at [facility] Main Entrance, medical mall, 30 mins prior to your test.  [address]  [place], [place]  [phone]  Diabetic Preparation:  Hold oral medications. You may take NPH and Lantus insulin. Do not take Humalog or Humulin R (Regular Insulin) the day of your test. Check blood sugars prior to leaving the house. If able to eat breakfast prior to 3 hour fasting, you may take all medications, including your insulin, Do not worry if you miss your breakfast dose of insulin - start at your next meal.  In preparation for your appointment, medication and supplies will be purchased.  Appointment availability is limited, so if you need to cancel or reschedule, please call the Radiology Department at [phone] [facility]) OR [phone] [facility])  24 hours in advance to avoid a cancellation fee of $100.00  What to Expect After you Arrive:  Once you arrive and check in for your appointment, you will be taken to a preparation room within the Radiology Department.  A technologist or Nurse will obtain your medical history, verify that you are correctly prepped for the exam, and explain the procedure.  Afterwards,  an IV will be started in your arm and electrodes will be placed on your skin for EKG monitoring during the stress portion of the exam. Then you will be escorted to the PET/CT scanner.  There, staff will get you positioned on the scanner and obtain a blood pressure and EKG.  During the exam, you will continue to be  connected to the EKG and blood pressure machines.  A small, safe amount of a radioactive tracer will be injected in your IV to obtain a series of pictures of your heart along with an injection of a stress agent.    After your Exam:  It is recommended that you eat a meal and drink a caffeinated beverage to counter act any effects of the stress agent.  Drink plenty of fluids for the remainder of the day and urinate frequently for the first couple of hours after the exam.  Your doctor will inform you of your test results within 7-10 business days.  For more information and frequently asked questions, please visit our website : [email or web address]  For questions about your test or how to prepare for your test, please call: Cardiac Imaging Nurse Navigators Office: [phone]

## 2023-02-09 NOTE — Progress Notes (Signed)
Cardiology Office Note:    Date:  02/09/2023   ID:  Nathaniel [name], DOB 09-21-1945, MRN [id number]  PCP:  Nathaniel Ates, MD   [facility] Providers Cardiologist:  Nathaniel Bollman, MD     Referring MD: Nathaniel Ates, MD   Chief Complaint  Patient presents with   Coronary Artery Disease    History of Present Illness:    Nathaniel [name] is a 77 y.o. male with a hx of CAD and microvascular dysfunction, mixed hyperlipidemia, and chronic bradycardia, presenting for follow-up evaluation.  Please see my extensive note from November 30, 2022 for full details of his history.  He called in with recurrent chest pain and was added onto my schedule today. He is here alone today. He was last seen by me in June 2024 and initially did well but has developed recurrent chest discomfort. He has been to [place] and was pretty active with rowing, carrying luggage, etc. He had no angina until the past few weeks with recurrent angina. He developed recurrent angina at rest when he was lying down. This felt very similar to his episode in June when he was hospitalized with NSTEMI. He sometimes has severe chest pain at night, worse when lying down. It feels like a 'crushing spasm' type of pain.  No shortness of breath, edema, or recent heart palpitations.  Past Medical History:  Diagnosis Date   Allergic rhinitis    Anemia    pt unaware   Anginal pain (HCC)    Microvascular angina- followed by Dr Excell Seltzer   Arthritis    back   Cancer [facility])    Basal cell -    Coronary artery disease    nonobstructive   DVT (deep venous thrombosis) (HCC) 03/2011   after prolonged travel   GERD (gastroesophageal reflux disease)    Headache    Vision Migraines   Hiatal hernia    small   History of rheumatic fever    questionable   Hyperlipidemia    Increased prostate specific antigen (PSA) velocity    Laryngopharyngeal reflux    Mitral valve prolapse    Palpitations    PE (pulmonary embolism) 03/2011   after  prolonged travel   PONV (postoperative nausea and vomiting)    Fentyl- N/V   Pruritus    resolved   Shortness of breath dyspnea    with Exertion   Sinus bradycardia     Past Surgical History:  Procedure Laterality Date   COLONOSCOPY     COLONOSCOPY N/A 02/24/2015   Procedure: COLONOSCOPY;  Surgeon: [name], MD;  Location: [facility] ENDOSCOPY;  Service: Endoscopy;  Laterality: N/A;   CYSTOSCOPY     ESOPHAGOGASTRODUODENOSCOPY N/A 02/06/2013   Procedure: ESOPHAGOGASTRODUODENOSCOPY (EGD);  Surgeon: Florencia Reasons, MD;  Location: [facility] ENDOSCOPY;  Service: Endoscopy;  Laterality: N/A;   ESOPHAGOGASTRODUODENOSCOPY N/A 02/24/2015   Procedure: ESOPHAGOGASTRODUODENOSCOPY (EGD);  Surgeon: [name], MD;  Location: [facility] ENDOSCOPY;  Service: Endoscopy;  Laterality: N/A;   HUMERUS FRACTURE SURGERY Right 05/2003   LEFT HEART CATH AND CORONARY ANGIOGRAPHY N/A 02/27/2022   Procedure: LEFT HEART CATH AND CORONARY ANGIOGRAPHY;  Surgeon: Nathaniel Bollman, MD;  Location: [facility] INVASIVE CV LAB;  Service: Cardiovascular;  Laterality: N/A;   Repair of an AC seperation of the left     SHOULDER ARTHROSCOPY Left    A/C tear   TONSILLECTOMY AND ADENOIDECTOMY      Current Medications: Current Meds  Medication Sig   amLODipine (NORVASC) 2.5 MG tablet  Take 1 tablet (2.5 mg total) by mouth daily.   ASPIRIN LOW DOSE 81 MG tablet TAKE 1 TABLET(81 MG) BY MOUTH DAILY. SWALLOW WHOLE   azelastine (ASTELIN) 0.1 % nasal spray Place 1-2 sprays into both nostrils 2 (two) times daily as needed for rhinitis or allergies.   cholecalciferol (VITAMIN D) 1000 UNITS tablet Take 1,000 Units by mouth every morning.    ciclopirox (LOPROX) 0.77 % cream Apply 1 Application topically 2 (two) times daily as needed (rash).   clopidogrel (PLAVIX) 75 MG tablet Take 1 tablet (75 mg total) by mouth daily with breakfast.   Cyanocobalamin (VITAMIN B-12) 2500 MCG SUBL Place 1 tablet under the tongue every Sunday.   desoximetasone (TOPICORT) 0.25  % cream Apply 1 Application topically daily as needed (itching on the back).   ferrous sulfate 325 (65 FE) MG EC tablet Take 325 mg by mouth every other day.   Magnesium 250 MG TABS Take 500 mg by mouth at bedtime.   mupirocin cream (BACTROBAN) 2 % Apply 1 Application topically 2 (two) times daily as needed (rash).   nitroGLYCERIN (NITROSTAT) 0.4 MG SL tablet Dissolve 1 tablet under the tongue every 5 minutes as needed for chest pain. Max of 3 doses, then 911.   omega-3 acid ethyl esters (LOVAZA) 1 g capsule Take 1 g by mouth 2 (two) times daily.   pantoprazole (PROTONIX) 40 MG tablet Take 1 tablet (40 mg total) by mouth daily at 10 pm. (Patient taking differently: Take 40 mg by mouth 2 (two) times daily.)   ranolazine (RANEXA) 500 MG 12 hr tablet Take 1 tablet (500 mg total) by mouth 2 (two) times daily.   rosuvastatin (CRESTOR) 20 MG tablet Take 1 tablet (20 mg total) by mouth daily at 10 pm.   senna (SENOKOT) 8.6 MG tablet Take 2 tablets by mouth 2 (two) times daily.   sildenafil (REVATIO) 20 MG tablet Take 20 mg by mouth as needed (For ED).   tamsulosin (FLOMAX) 0.4 MG CAPS capsule Take 0.4 mg by mouth at bedtime.    [DISCONTINUED] famotidine (PEPCID) 40 MG tablet Take 40 mg by mouth daily.   [DISCONTINUED] ranolazine (RANEXA) 500 MG 12 hr tablet Take 1 tablet (500 mg total) by mouth 2 (two) times daily.     Allergies:   Penicillins, Fentanyl, and Imdur [isosorbide dinitrate]   Social History   Socioeconomic History   Marital status: Married    Spouse name: Not on file   Number of children: Not on file   Years of education: Not on file   Highest education level: Not on file  Occupational History   Occupation: retired  Tobacco Use   Smoking status: Never   Smokeless tobacco: Never  Substance and Sexual Activity   Alcohol use: Yes    Comment: occasional- special ocassional   Drug use: No   Sexual activity: Not on file  Other Topics Concern   Not on file  Social History  Narrative   Pt lives in [place] with wife and daughter (has autism).  Son is an anesthesia resident in [place].   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   Social Determinants of Health   Financial Resource Strain: Not on file  Food Insecurity: Not on file  Transportation Needs: Not on file  Physical Activity: Not on file  Stress: Not on file  Social Connections: Not on file     Family History: The patient's family history includes  CAD (age of onset: 66) in an other family member; Cancer in an other family member; Migraines in his mother.  ROS:   Please see the history of present illness.    All other systems reviewed and are negative.  EKGs/Labs/Other Studies Reviewed:    The following studies were reviewed today: EKG Interpretation Date/Time:  Friday February 09 2023 10:40:02 EDT Ventricular Rate:  40 PR Interval:  262 QRS Duration:  116 QT Interval:  486 QTC Calculation: 396 R Axis:   84  Text Interpretation: Marked sinus bradycardia with marked sinus arrhythmia with 1st degree A-V block Nonspecific ST abnormality Confirmed by Nathaniel [name] [phone]) on 02/09/2023 12:51:39 PM    Recent Labs: 06/15/2022: Magnesium 2.2; TSH 1.804 11/13/2022: ALT 16 12/15/2022: BUN 20; Creatinine, Ser 1.33; Hemoglobin 12.9; Platelet Count 213; Potassium 4.1; Sodium 139  Recent Lipid Panel    Component Value Date/Time   CHOL 116 11/13/2022 1255   TRIG 55 11/13/2022 1255   HDL 43 11/13/2022 1255   CHOLHDL 2.7 11/13/2022 1255   CHOLHDL 4.1 02/27/2022 0129   VLDL 27 02/27/2022 0129   LDLCALC 61 11/13/2022 1255     Risk Assessment/Calculations:                Physical Exam:    VS:  BP 120/70   Pulse (!) 40   Ht 5\' 6"  (1.676 m)   Wt 165 lb 6.4 oz (75 kg)   SpO2 95%   BMI 26.70 kg/m     Wt Readings from Last 3 Encounters:  02/09/23 165 lb 6.4 oz (75 kg)  12/15/22 167 lb 6.4 oz (75.9 kg)  11/30/22 166 lb 9.6 oz (75.6 kg)     GEN:  Well  nourished, well developed in no acute distress HEENT: Normal NECK: No JVD; No carotid bruits LYMPHATICS: No lymphadenopathy CARDIAC: Bradycardic and regular, no murmurs, rubs, gallops RESPIRATORY:  Clear to auscultation without rales, wheezing or rhonchi  ABDOMEN: Soft, non-tender, non-distended MUSCULOSKELETAL:  No edema; No deformity  SKIN: Warm and dry NEUROLOGIC:  Alert and oriented x 3 PSYCHIATRIC:  Normal affect   ASSESSMENT:    1. Coronary artery disease involving native coronary artery of native heart with angina pectoris (HCC)   2. Palpitations   3. Mixed hyperlipidemia    PLAN:    In order of problems listed above:  The patient continues to have his typical symptoms of angina, but primarily at night when he is lying supine.  We discussed differential diagnosis including microvascular angina versus esophageal spasm is most likely causes.  He has had multiple imaging studies including CTA of the chest and cardiac catheterization.  We reviewed further diagnostic options and I recommended proceeding with a cardiac PET scan for stress testing and evaluation of microvascular dysfunction.  Will continue his medical regimen which includes aspirin, clopidogrel, and amlodipine.  He is not a candidate for beta-blockers due to marked sinus bradycardia.  He has not tolerated isosorbide well due to headache.  He is willing to try ranolazine 500 mg twice daily and this is recommended and prescribed today.  He will follow-up in 3 months.  I have sent for his recent cardiac catheterization films from [place] and I would like to review these. Stable pattern Treated with rosuvastatin.  Recent lipids reviewed with a cholesterol of 116, HDL 43, LDL 61.      Medication Adjustments/Labs and Tests Ordered: Current medicines are reviewed at length with the patient today.  Concerns regarding medicines are outlined above.  Orders Placed This Encounter  Procedures   NM PET CT CARDIAC PERFUSION MULTI  W/ABSOLUTE BLOODFLOW   EKG 12-Lead   Meds ordered this encounter  Medications   ranolazine (RANEXA) 500 MG 12 hr tablet    Sig: Take 1 tablet (500 mg total) by mouth 2 (two) times daily.    Dispense:  180 tablet    Refill:  3    Patient Instructions  Medication Instructions:  Ranexa/Ranolazine 500mg  twice daily *If you need a refill on your cardiac medications before your next appointment, please call your pharmacy*  Lab Work: NONE If you have labs (blood work) drawn today and your tests are completely normal, you will receive your results only by: MyChart Message (if you have MyChart) OR A paper copy in the mail If you have any lab test that is abnormal or we need to change your treatment, we will call you to review the results.  Testing/Procedures: Cardiac PET scan Your physician has requested that you have cardiac CT. Cardiac computed tomography (CT) is a painless test that uses an x-ray machine to take clear, detailed pictures of your heart. For further information please visit [email or web address] Please follow instruction sheet as given.   Follow-Up: At [facility], you and your health needs are our priority.  As part of our continuing mission to provide you with exceptional heart care, we have created designated Provider Care Teams.  These Care Teams include your primary Cardiologist (physician) and Advanced Practice Providers (APPs -  Physician Assistants and Nurse Practitioners) who all work together to provide you with the care you need, when you need it.  Your next appointment:   3 month(s)  Provider:   Tonny Bollman, MD       Other Instructions How to Prepare for Your Cardiac PET/CT Stress Test:  1. Please do not take these medications before your test:   Medications that may interfere with the cardiac pharmacological stress agent (ex. nitrates - including erectile dysfunction medications, isosorbide mononitrate, tamulosin or beta-blockers) the day of  the exam. (Erectile dysfunction medication should be held for at least 72 hrs prior to test) Theophylline containing medications for 12 hours. Dipyridamole 48 hours prior to the test. Your remaining medications may be taken with water.  2. Nothing to eat or drink, except water, 3 hours prior to arrival time.   NO caffeine/decaffeinated products, or chocolate 12 hours prior to arrival.  3. NO perfume, cologne or lotion  4. Total time is 1 to 2 hours; you may want to bring reading material for the waiting time.  5. Please report to Radiology at the [facility] Main Entrance 30 minutes early for your test.  [address] [place], [place] [address]  6. Please report to Radiology at [facility] Main Entrance, medical mall, 30 mins prior to your test.  [address]  [place], [place]  [phone]  Diabetic Preparation:  Hold oral medications. You may take NPH and Lantus insulin. Do not take Humalog or Humulin R (Regular Insulin) the day of your test. Check blood sugars prior to leaving the house. If able to eat breakfast prior to 3 hour fasting, you may take all medications, including your insulin, Do not worry if you miss your breakfast dose of insulin - start at your next meal.  In preparation for your appointment, medication and supplies will be purchased.  Appointment availability is limited, so if you need to cancel or reschedule, please call the Radiology  Department at [phone] [facility]) OR [phone] [facility])  24 hours in advance to avoid a cancellation fee of $100.00  What to Expect After you Arrive:  Once you arrive and check in for your appointment, you will be taken to a preparation room within the Radiology Department.  A technologist or Nurse will obtain your medical history, verify that you are correctly prepped for the exam, and explain the procedure.  Afterwards,  an IV will be started in your arm and electrodes will be  placed on your skin for EKG monitoring during the stress portion of the exam. Then you will be escorted to the PET/CT scanner.  There, staff will get you positioned on the scanner and obtain a blood pressure and EKG.  During the exam, you will continue to be connected to the EKG and blood pressure machines.  A small, safe amount of a radioactive tracer will be injected in your IV to obtain a series of pictures of your heart along with an injection of a stress agent.    After your Exam:  It is recommended that you eat a meal and drink a caffeinated beverage to counter act any effects of the stress agent.  Drink plenty of fluids for the remainder of the day and urinate frequently for the first couple of hours after the exam.  Your doctor will inform you of your test results within 7-10 business days.  For more information and frequently asked questions, please visit our website : [email or web address]  For questions about your test or how to prepare for your test, please call: Cardiac Imaging Nurse Navigators Office: [phone]    Signed, Nathaniel Bollman, MD  02/09/2023 12:52 PM    [facility]

## 2023-02-15 DIAGNOSIS — N138 Other obstructive and reflux uropathy: Secondary | ICD-10-CM | POA: Diagnosis not present

## 2023-02-21 ENCOUNTER — Other Ambulatory Visit: Payer: Self-pay | Admitting: *Deleted

## 2023-02-21 DIAGNOSIS — I83813 Varicose veins of bilateral lower extremities with pain: Secondary | ICD-10-CM

## 2023-02-22 ENCOUNTER — Encounter: Payer: Self-pay | Admitting: Cardiovascular Disease

## 2023-02-28 ENCOUNTER — Ambulatory Visit (HOSPITAL_COMMUNITY)
Admission: RE | Admit: 2023-02-28 | Discharge: 2023-02-28 | Disposition: A | Discharge status: Home / Self Care | Payer: Medicare Other | Source: Ambulatory Visit | Attending: Vascular Surgery | Admitting: Vascular Surgery

## 2023-02-28 ENCOUNTER — Ambulatory Visit: Payer: Medicare Other | Admitting: Vascular Surgery

## 2023-02-28 ENCOUNTER — Encounter: Payer: Self-pay | Admitting: Vascular Surgery

## 2023-02-28 VITALS — BP 108/62 | HR 39 | Temp 97.0°F | Resp 18 | Ht 66.0 in | Wt 166.1 lb

## 2023-02-28 DIAGNOSIS — I872 Venous insufficiency (chronic) (peripheral): Secondary | ICD-10-CM

## 2023-02-28 DIAGNOSIS — I83813 Varicose veins of bilateral lower extremities with pain: Secondary | ICD-10-CM

## 2023-02-28 NOTE — Progress Notes (Signed)
REASON FOR VISIT:   Follow-up of chronic venous insufficiency  MEDICAL ISSUES:   CHRONIC VENOUS INSUFFICIENCY: This patient has some small varicose veins bilaterally in addition to reticular veins and telangiectasias.  We have again discussed conservative measures.  I have encouraged him to avoid prolonged sitting and standing.  We discussed the importance of exercise and daily leg elevation.  He will continue to wear his compression stockings as needed.  Currently he has no significant superficial venous reflux and is not a candidate for laser ablation.  The varicose veins along the lateral aspect of his right leg may be amenable to foam sclerotherapy however I explained that we do not do foam sclerotherapy here.  For the smaller telangiectasias and reticular veins I have given him Cherene Julian, RN's card if he wants to consider sclerotherapy.  If his symptoms progress then certainly we will be happy to see him back at any time.  HPI:   Nathaniel [name] is a pleasant 77 y.o. male who I last saw on 01/18/2022 with chronic venous insufficiency.  He was referred with varicose veins of both lower extremities.  He had C2 venous disease.  At that time, he was not a candidate for intervention.  We discussed conservative measures.  He comes in for a 1 year follow-up visit.  He states that since his last visit his varicose veins have increased in size significantly.  He denies significant aching pain or heaviness in his legs associated with his venous disease.  He does have multiple varicose veins of both lower extremities and is concerned as they have enlarged significantly over the last year.  He has a previous history of a DVT in the left popliteal vein in 2012.  He does wear compression stockings at times.  He does elevate his legs at times.  Past Medical History:  Diagnosis Date   Allergic rhinitis    Anemia    pt unaware   Anginal pain (HCC)    Microvascular angina- followed by Dr Excell Seltzer    Arthritis    back   Cancer [facility])    Basal cell -    Coronary artery disease    nonobstructive   DVT (deep venous thrombosis) (HCC) 03/2011   after prolonged travel   GERD (gastroesophageal reflux disease)    Headache    Vision Migraines   Hiatal hernia    small   History of rheumatic fever    questionable   Hyperlipidemia    Increased prostate specific antigen (PSA) velocity    Laryngopharyngeal reflux    Mitral valve prolapse    Palpitations    PE (pulmonary embolism) 03/2011   after prolonged travel   PONV (postoperative nausea and vomiting)    Fentyl- N/V   Pruritus    resolved   Shortness of breath dyspnea    with Exertion   Sinus bradycardia     Family History  Problem Relation Age of Onset   Migraines Mother    Cancer Other        mother (BrCa and glioblastoma) and brother (GE junction)   CAD Other 55       father died suddenly in the setting of anginal symptoms    SOCIAL HISTORY: Social History   Tobacco Use   Smoking status: Never   Smokeless tobacco: Never  Substance Use Topics   Alcohol use: Yes    Comment: occasional- special ocassional    Allergies  Allergen Reactions   Penicillins Anaphylaxis, Other (  See Comments) and Hives    Hands swell, throat swells Hands swell, throat swells No reaction listed Hives (pt clarified at office visit)   Fentanyl Nausea And Vomiting and Other (See Comments)   Imdur [Isosorbide Dinitrate] Other (See Comments)    Migraine headache    Current Outpatient Medications  Medication Sig Dispense Refill   amLODipine (NORVASC) 2.5 MG tablet Take 1 tablet (2.5 mg total) by mouth daily. 90 tablet 3   ASPIRIN LOW DOSE 81 MG tablet TAKE 1 TABLET(81 MG) BY MOUTH DAILY. SWALLOW WHOLE 90 tablet 2   azelastine (ASTELIN) 0.1 % nasal spray Place 1-2 sprays into both nostrils 2 (two) times daily as needed for rhinitis or allergies.     cholecalciferol (VITAMIN D) 1000 UNITS tablet Take 1,000 Units by mouth every morning.       ciclopirox (LOPROX) 0.77 % cream Apply 1 Application topically 2 (two) times daily as needed (rash).     clopidogrel (PLAVIX) 75 MG tablet Take 1 tablet (75 mg total) by mouth daily with breakfast. 90 tablet 3   Cyanocobalamin (VITAMIN B-12) 2500 MCG SUBL Place 1 tablet under the tongue every Sunday.     desoximetasone (TOPICORT) 0.25 % cream Apply 1 Application topically daily as needed (itching on the back).     ferrous sulfate 325 (65 FE) MG EC tablet Take 325 mg by mouth every other day.     Magnesium 250 MG TABS Take 500 mg by mouth at bedtime.     mupirocin cream (BACTROBAN) 2 % Apply 1 Application topically 2 (two) times daily as needed (rash).     nitroGLYCERIN (NITROSTAT) 0.4 MG SL tablet Dissolve 1 tablet under the tongue every 5 minutes as needed for chest pain. Max of 3 doses, then 911. 25 tablet 6   omega-3 acid ethyl esters (LOVAZA) 1 g capsule Take 1 g by mouth 2 (two) times daily.     pantoprazole (PROTONIX) 40 MG tablet Take 1 tablet (40 mg total) by mouth daily at 10 pm. (Patient taking differently: Take 40 mg by mouth 2 (two) times daily.) 90 tablet 0   rosuvastatin (CRESTOR) 20 MG tablet Take 1 tablet (20 mg total) by mouth daily at 10 pm. 90 tablet 3   senna (SENOKOT) 8.6 MG tablet Take 2 tablets by mouth 2 (two) times daily.     sildenafil (REVATIO) 20 MG tablet Take 20 mg by mouth as needed (For ED).     tamsulosin (FLOMAX) 0.4 MG CAPS capsule Take 0.4 mg by mouth at bedtime.      ranolazine (RANEXA) 500 MG 12 hr tablet Take 1 tablet (500 mg total) by mouth 2 (two) times daily. (Patient not taking: Reported on 02/28/2023) 180 tablet 3   No current facility-administered medications for this visit.    REVIEW OF SYSTEMS:  [X]  denotes positive finding, [ ]  denotes negative finding Cardiac  Comments:  Chest pain or chest pressure:    Shortness of breath upon exertion:    Short of breath when lying flat:    Irregular heart rhythm:        Vascular    Pain in calf, thigh,  or hip brought on by ambulation:    Pain in feet at night that wakes you up from your sleep:     Blood clot in your veins:    Leg swelling:         Pulmonary    Oxygen at home:    Productive cough:  Wheezing:         Neurologic    Sudden weakness in arms or legs:     Sudden numbness in arms or legs:     Sudden onset of difficulty speaking or slurred speech:    Temporary loss of vision in one eye:     Problems with dizziness:         Gastrointestinal    Blood in stool:     Vomited blood:         Genitourinary    Burning when urinating:     Blood in urine:        Psychiatric    Major depression:         Hematologic    Bleeding problems:    Problems with blood clotting too easily:        Skin    Rashes or ulcers:        Constitutional    Fever or chills:     PHYSICAL EXAM:   Vitals:   02/28/23 1429  BP: 108/62  Pulse: (!) 39  Resp: 18  Temp: (!) 97 F (36.1 C)  TempSrc: Temporal  SpO2: 95%  Weight: 166 lb 1.6 oz (75.3 kg)  Height: 5\' 6"  (1.676 m)    GENERAL: The patient is a well-nourished male, in no acute distress. The vital signs are documented above. CARDIAC: There is a regular rate and rhythm.  VASCULAR: I do not detect carotid bruits. He has palpable popliteal and pedal pulses bilaterally. He has telangiectasias and reticular veins bilaterally and also some varicose veins along the lateral aspect of his right leg.         PULMONARY: There is good air exchange bilaterally without wheezing or rales. ABDOMEN: Soft and non-tender with normal pitched bowel sounds.  MUSCULOSKELETAL: There are no major deformities or cyanosis. NEUROLOGIC: No focal weakness or paresthesias are detected. SKIN: There are no ulcers or rashes noted. PSYCHIATRIC: The patient has a normal affect.  DATA:    VENOUS DUPLEX: I have independently interpreted his venous duplex scan today.  This was of the right lower extremity only.  There was no evidence of DVT.  There  was no deep venous reflux.  There was superficial venous reflux in the right great saphenous vein from the saphenofemoral junction to the proximal thigh.  The vein however was only 3 mm in diameter.  Waverly Ferrari [facility] [phone]

## 2023-03-01 DIAGNOSIS — L820 Inflamed seborrheic keratosis: Secondary | ICD-10-CM | POA: Diagnosis not present

## 2023-03-02 ENCOUNTER — Telehealth: Payer: Self-pay | Admitting: Cardiovascular Disease

## 2023-03-02 DIAGNOSIS — H52203 Unspecified astigmatism, bilateral: Secondary | ICD-10-CM | POA: Diagnosis not present

## 2023-03-02 DIAGNOSIS — I25119 Atherosclerotic heart disease of native coronary artery with unspecified angina pectoris: Secondary | ICD-10-CM

## 2023-03-02 DIAGNOSIS — H5213 Myopia, bilateral: Secondary | ICD-10-CM | POA: Diagnosis not present

## 2023-03-02 DIAGNOSIS — H2513 Age-related nuclear cataract, bilateral: Secondary | ICD-10-CM | POA: Diagnosis not present

## 2023-03-02 NOTE — Telephone Encounter (Signed)
Patient states his insurance advised him that PET CT was denied by insurance.

## 2023-03-02 NOTE — Telephone Encounter (Signed)
Called and left patient message letting him know that we are aware of the denial and intend to seek peer-to-peer review to get PET scan covered. Have already sent message to [facility] in billing, awaiting response from her to know how to move forward.

## 2023-03-05 DIAGNOSIS — Z0000 Encounter for general adult medical examination without abnormal findings: Secondary | ICD-10-CM | POA: Diagnosis not present

## 2023-03-05 DIAGNOSIS — N1831 Chronic kidney disease, stage 3a: Secondary | ICD-10-CM | POA: Diagnosis not present

## 2023-03-05 DIAGNOSIS — K219 Gastro-esophageal reflux disease without esophagitis: Secondary | ICD-10-CM | POA: Diagnosis not present

## 2023-03-05 DIAGNOSIS — R253 Fasciculation: Secondary | ICD-10-CM | POA: Diagnosis not present

## 2023-03-05 DIAGNOSIS — I2510 Atherosclerotic heart disease of native coronary artery without angina pectoris: Secondary | ICD-10-CM | POA: Diagnosis not present

## 2023-03-05 DIAGNOSIS — E559 Vitamin D deficiency, unspecified: Secondary | ICD-10-CM | POA: Diagnosis not present

## 2023-03-05 DIAGNOSIS — E7800 Pure hypercholesterolemia, unspecified: Secondary | ICD-10-CM | POA: Diagnosis not present

## 2023-03-05 DIAGNOSIS — D649 Anemia, unspecified: Secondary | ICD-10-CM | POA: Diagnosis not present

## 2023-03-07 DIAGNOSIS — Z23 Encounter for immunization: Secondary | ICD-10-CM | POA: Diagnosis not present

## 2023-03-09 DIAGNOSIS — R252 Cramp and spasm: Secondary | ICD-10-CM | POA: Diagnosis not present

## 2023-03-09 DIAGNOSIS — I87393 Chronic venous hypertension (idiopathic) with other complications of bilateral lower extremity: Secondary | ICD-10-CM | POA: Diagnosis not present

## 2023-03-09 DIAGNOSIS — I83893 Varicose veins of bilateral lower extremities with other complications: Secondary | ICD-10-CM | POA: Diagnosis not present

## 2023-03-09 DIAGNOSIS — R600 Localized edema: Secondary | ICD-10-CM | POA: Diagnosis not present

## 2023-03-19 NOTE — Addendum Note (Signed)
Addended by: Lars Mage on: 03/19/2023 09:20 AM   Modules accepted: Orders

## 2023-03-29 DIAGNOSIS — L820 Inflamed seborrheic keratosis: Secondary | ICD-10-CM | POA: Diagnosis not present

## 2023-04-09 ENCOUNTER — Telehealth (HOSPITAL_COMMUNITY): Payer: Self-pay | Admitting: *Deleted

## 2023-04-09 DIAGNOSIS — I872 Venous insufficiency (chronic) (peripheral): Secondary | ICD-10-CM | POA: Diagnosis not present

## 2023-04-09 NOTE — Telephone Encounter (Signed)
Attempted to call patient regarding upcoming cardiac PET appointment. Left message on voicemail with name and callback number  Larey Brick RN Navigator Cardiac Imaging [facility] [phone] Office [phone] Cell  Reminder to hold caffeine, sildenafil, and Ranexa prior to appt.

## 2023-04-10 ENCOUNTER — Telehealth (HOSPITAL_COMMUNITY): Payer: Self-pay | Admitting: *Deleted

## 2023-04-10 NOTE — Telephone Encounter (Signed)
Patient returning call about his upcoming cardiac imaging study; pt verbalizes understanding of appt date/time, parking situation and where to check in, pre-test NPO status, and verified current allergies; name and call back number provided for further questions should they arise  Larey Brick RN Navigator Cardiac Imaging [facility] [phone] office [phone] cell  Patient aware to avoid caffeine and Revatio prior to cardiac PET scan.

## 2023-04-11 ENCOUNTER — Ambulatory Visit (HOSPITAL_COMMUNITY)
Admission: RE | Admit: 2023-04-11 | Discharge: 2023-04-11 | Disposition: A | Discharge status: Home / Self Care | Payer: Medicare Other | Source: Ambulatory Visit | Attending: Cardiovascular Disease | Admitting: Cardiovascular Disease

## 2023-04-11 DIAGNOSIS — I25119 Atherosclerotic heart disease of native coronary artery with unspecified angina pectoris: Secondary | ICD-10-CM | POA: Insufficient documentation

## 2023-04-11 LAB — NM PET CT CARDIAC PERFUSION MULTI W/ABSOLUTE BLOODFLOW
LV dias vol: 117 mL (ref 62–150)
MBFR: 1.46
Nuc Rest EF: 44 %
Nuc Stress EF: 50 %
Peak HR: 63 {beats}/min
Rest HR: 44 {beats}/min
Rest MBF: 0.72 ml/g/min
Rest Nuclear Isotope Dose: 19.4 mCi
Rest perfusion cavity size (mL): 117 mL
ST Depression (mm): 0 mm
Stress MBF: 1.05 ml/g/min
Stress Nuclear Isotope Dose: 19.4 mCi
Stress perfusion cavity size (mL): 115 mL
TID: 0.97

## 2023-04-11 MED ORDER — RUBIDIUM RB82 GENERATOR (RUBYFILL)
19.4100 | PACK | Freq: Once | INTRAVENOUS | Status: AC
  Administered 2023-04-11: 19.41 via INTRAVENOUS

## 2023-04-11 MED ORDER — REGADENOSON 0.4 MG/5ML IV SOLN
INTRAVENOUS | Status: AC
  Filled 2023-04-11: qty 5

## 2023-04-11 MED ORDER — RUBIDIUM RB82 GENERATOR (RUBYFILL)
19.4300 | PACK | Freq: Once | INTRAVENOUS | Status: AC
  Administered 2023-04-11: 19.43 via INTRAVENOUS

## 2023-04-11 MED ORDER — REGADENOSON 0.4 MG/5ML IV SOLN
0.4000 mg | Freq: Once | INTRAVENOUS | Status: AC
  Administered 2023-04-11: 0.4 mg via INTRAVENOUS

## 2023-04-16 ENCOUNTER — Other Ambulatory Visit: Payer: Self-pay | Admitting: Cardiology

## 2023-04-16 ENCOUNTER — Inpatient Hospital Stay
Admission: RE | Admit: 2023-04-16 | Discharge: 2023-04-16 | Disposition: A | Discharge status: Home / Self Care | Payer: Self-pay | Source: Ambulatory Visit | Attending: Cardiovascular Disease | Admitting: Cardiovascular Disease

## 2023-04-16 DIAGNOSIS — I2510 Atherosclerotic heart disease of native coronary artery without angina pectoris: Secondary | ICD-10-CM

## 2023-04-19 ENCOUNTER — Ambulatory Visit: Payer: Medicare Other | Attending: Cardiovascular Disease

## 2023-04-19 ENCOUNTER — Ambulatory Visit: Payer: Medicare Other | Attending: Cardiovascular Disease | Admitting: Cardiovascular Disease

## 2023-04-19 ENCOUNTER — Other Ambulatory Visit: Payer: Self-pay | Admitting: Cardiovascular Disease

## 2023-04-19 ENCOUNTER — Encounter: Payer: Self-pay | Admitting: Cardiovascular Disease

## 2023-04-19 VITALS — BP 90/60 | HR 42 | Ht 66.0 in | Wt 167.6 lb

## 2023-04-19 DIAGNOSIS — I25119 Atherosclerotic heart disease of native coronary artery with unspecified angina pectoris: Secondary | ICD-10-CM

## 2023-04-19 DIAGNOSIS — I493 Ventricular premature depolarization: Secondary | ICD-10-CM

## 2023-04-19 DIAGNOSIS — R002 Palpitations: Secondary | ICD-10-CM | POA: Diagnosis not present

## 2023-04-19 DIAGNOSIS — I5020 Unspecified systolic (congestive) heart failure: Secondary | ICD-10-CM

## 2023-04-19 DIAGNOSIS — E782 Mixed hyperlipidemia: Secondary | ICD-10-CM | POA: Diagnosis not present

## 2023-04-19 DIAGNOSIS — R001 Bradycardia, unspecified: Secondary | ICD-10-CM

## 2023-04-19 NOTE — Progress Notes (Signed)
Cardiology Office Note:    Date:  04/19/2023   ID:  Nathaniel [name], DOB 12-20-1945, MRN [id number]  PCP:  Thana Ates, MD   [facility] Providers Cardiologist:  Tonny Bollman, MD     Referring MD: Thana Ates, MD   Chief Complaint  Patient presents with   Coronary Artery Disease    History of Present Illness:    Nathaniel [name] is a 77 y.o. male with a hx of coronary artery disease and microvascular angina, presenting for follow-up evaluation.  The patient also has chronic bradycardia and mixed hyperlipidemia.  He presented with non-STEMI in 2023 and was found to have severe diagonal stenosis, undergoing PCI with balloon angioplasty at that time.  He had recurrent non-STEMI while traveling in June 2024 and underwent cardiac catheterization demonstrating stable coronary findings with nonobstructive CAD.  When I saw him last in August 2024 he had persistent angina, primarily at night when lying supine.  I recommended a cardiac PET scan which was performed April 11, 2023.  This demonstrated a small, mild reversible defect in the mid to apical anterior wall consistent with ischemia, normal LVEF response to stress, no transient ischemic dilatation and no evidence of MI.  His resting LVEF was calculated at 44% and stress LVEF was 50%.  Overall the study was interpreted as "low risk."  I was able to obtain the patient's cardiac catheterization films from the outside hospital from his heart catheterization performed 11/22/2022 and on my personal review comparing those films to his previous, there is no change and there is nonobstructive disease present in the LAD and diagonal branches and no stenosis in the circumflex, left main, or RCA.  The patient is here alone today.  He complains of fatigue and heart palpitations.  He has had no angina in the last 4 weeks.  His symptoms of chest pain have completely resolved.  He remains active but is not engaged in his much exercise as he has  been in the past.  He has not been doing any rowing lately.  He is still playing golf.  He has no orthopnea, PND, or leg swelling.   Current Medications: Current Meds  Medication Sig   amLODipine (NORVASC) 2.5 MG tablet Take 1 tablet (2.5 mg total) by mouth daily.   ASPIRIN LOW DOSE 81 MG tablet TAKE 1 TABLET(81 MG) BY MOUTH DAILY. SWALLOW WHOLE   azelastine (ASTELIN) 0.1 % nasal spray Place 1-2 sprays into both nostrils 2 (two) times daily as needed for rhinitis or allergies.   cholecalciferol (VITAMIN D) 1000 UNITS tablet Take 1,000 Units by mouth every morning.    ciclopirox (LOPROX) 0.77 % cream Apply 1 Application topically 2 (two) times daily as needed (rash).   clopidogrel (PLAVIX) 75 MG tablet Take 1 tablet (75 mg total) by mouth daily with breakfast.   Cyanocobalamin (VITAMIN B-12) 2500 MCG SUBL Place 1 tablet under the tongue every Sunday.   desoximetasone (TOPICORT) 0.25 % cream Apply 1 Application topically daily as needed (itching on the back).   ferrous sulfate 325 (65 FE) MG EC tablet Take 325 mg by mouth every other day.   Magnesium 250 MG TABS Take 500 mg by mouth at bedtime.   mupirocin cream (BACTROBAN) 2 % Apply 1 Application topically 2 (two) times daily as needed (rash).   nitroGLYCERIN (NITROSTAT) 0.4 MG SL tablet Dissolve 1 tablet under the tongue every 5 minutes as needed for chest pain. Max of 3 doses, then 911.  omega-3 acid ethyl esters (LOVAZA) 1 g capsule Take 1 g by mouth 2 (two) times daily.   pantoprazole (PROTONIX) 40 MG tablet Take 1 tablet (40 mg total) by mouth daily at 10 pm. (Patient taking differently: Take 40 mg by mouth 2 (two) times daily.)   rosuvastatin (CRESTOR) 20 MG tablet Take 1 tablet (20 mg total) by mouth daily at 10 pm.   senna (SENOKOT) 8.6 MG tablet Take 2 tablets by mouth 2 (two) times daily.   tadalafil (CIALIS) 10 MG tablet Take 10 mg by mouth daily as needed for erectile dysfunction.   tamsulosin (FLOMAX) 0.4 MG CAPS capsule Take 0.4  mg by mouth at bedtime.    [DISCONTINUED] ranolazine (RANEXA) 500 MG 12 hr tablet Take 1 tablet (500 mg total) by mouth 2 (two) times daily.   [DISCONTINUED] sildenafil (REVATIO) 20 MG tablet Take 20 mg by mouth as needed (For ED).     Allergies:   Penicillins, Fentanyl, and Imdur [isosorbide dinitrate]   ROS:   Please see the history of present illness.    All other systems reviewed and are negative.  EKGs/Labs/Other Studies Reviewed:    The following studies were reviewed today: Cardiac Studies & Procedures   CARDIAC CATHETERIZATION  CARDIAC CATHETERIZATION 02/27/2022  Narrative 1.  Patent left main with no stenosis 2.  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  Severe stenosis of the first diagonal subbranch, treated with PTCA, reducing 95% stenosis to 30% with TIMI-3 flow at the completion of the procedure and no evidence of dissection 4.  Patent left circumflex with no stenosis 5.  Patent RCA (dominant vessel) with no significant stenosis  Recommend: Continued aggressive medical therapy.  DAPT with aspirin and clopidogrel x12 months without interruption following non-STEMI.  Consider Pharm.D. clinic referral for PCSK9 inhibition to push LDL cholesterol to 55 or less  Findings Coronary Findings Diagnostic  Dominance: Right  Left Main The vessel exhibits minimal luminal irregularities.  Left Anterior Descending There is mild diffuse disease throughout the vessel. Prox LAD to Mid LAD lesion is 30% stenosed. Intramyocardial bridging is present through this region, more prominent after administration of intracoronary nitroglycerin  Lateral First Diagonal Branch Lat 1st Diag lesion is 95% stenosed.  Left Circumflex The vessel exhibits minimal luminal irregularities.  Right Coronary Artery The vessel exhibits minimal luminal irregularities.  Intervention  Lat 1st Diag lesion Angioplasty CATH VISTA  GUIDE 6FR XBLAD3.5 guide catheter was inserted. WIRE COUGAR XT STRL 190CM guidewire used to cross lesion. Balloon angioplasty was performed using a BALLN SAPPHIRE 2.0X15. Maximum pressure: 6 atm. Inflation time: 45 sec. Post-Intervention Lesion Assessment The intervention was successful. Pre-interventional TIMI flow is 3. Post-intervention TIMI flow is 3. No complications occurred at this lesion. There is a 30% residual stenosis post intervention.   STRESS TESTS  NM PET CT CARDIAC PERFUSION MULTI W/ABSOLUTE BLOODFLOW 04/11/2023  Narrative   Small, mild, reversible defect in the mid to apical anterior segments consistent with ischemia. Normal LVEF response to stress. No TID. MBFR is abnormal (1.45). If there are no concerns for obstructive 3-vessel CAD, the reduced MBFR represents microvascular dysfunction. Low-risk study by perfusion alone.   LV perfusion is abnormal. There is evidence of ischemia. There is no evidence of infarction. Defect 1: There is a small defect with mild reduction in uptake present in the apical to mid anterior location(s) that is reversible. There is normal wall motion in the defect area. Consistent  with ischemia. The defect is consistent with abnormal perfusion in the LAD territory.   Rest left ventricular function is abnormal. Rest global function is mildly reduced. There were no regional wall motion abnormalities. Rest EF: 44%. Stress left ventricular function is normal. Stress EF: 50%. End diastolic cavity size is normal.   Myocardial blood flow was computed to be 0.70ml/g/min at rest and 1.60ml/g/min at stress. Global myocardial blood flow reserve was 1.46 and was abnormal.   Coronary calcium assessment not performed due to prior revascularization.   Findings are consistent with ischemia. The study is low risk.  Electronically signed by Lennie Odor,  MD _________________________________________________________________________________________________________  Francia Greaves: OVER-READ INTERPRETATION  CT CHEST  The following report is a limited chest CT over-read performed by radiologist Dr. Irma Newness [facility] on 04/11/2023. This over-read does not include interpretation of cardiac or coronary anatomy or pathology nor does it include evaluation of the PET data. The cardiac PET-CT interpretation by the cardiologist is attached.  COMPARISON:  Chest radiographs 02/26/2022. Chest CTA 12/05/2022 and 04/02/2011  FINDINGS: Mediastinum/Nodes: No enlarged lymph nodes within the visualized mediastinum.  Lungs/Pleura: No pleural effusion or pneumothorax. Mild dependent atelectasis at both lung bases. No confluent airspace disease or suspicious nodularity.  Upper abdomen: No suspicious findings are seen in the visualized upper abdomen. There is a low-density lesion posteriorly in the right hepatic lobe which measures approximately 1.8 cm on image 66/3, stable from 2012 CTA, consistent with a benign finding.  Musculoskeletal/Chest wall: No chest wall mass or suspicious osseous findings within the visualized chest. Bilateral gynecomastia. Mild spondylosis.  IMPRESSION: No significant extracardiac findings within the visualized chest.   Electronically Signed By: Carey Bullocks M.D. On: 04/11/2023 11:35   ECHOCARDIOGRAM  ECHOCARDIOGRAM COMPLETE 02/27/2022  Narrative ECHOCARDIOGRAM REPORT    Patient Name:   DR. Samul Dada Date of Exam: 02/27/2022 Medical Rec #:  [id number]             Height:       66.0 in Accession #:    [id number]            Weight:       163.4 lb Date of Birth:  [birth year]             BSA:          1.835 m Patient Age:    59 years              BP:           130/61 mmHg Patient Gender: M                     HR:           42 bpm. Exam Location:  Inpatient  Procedure: 2D Echo, Color  Doppler and Cardiac Doppler  Indications:    Acute MI i21.9  History:        Patient has prior history of Echocardiogram examinations, most recent 04/25/2021. CAD; Risk Factors:Dyslipidemia.  Sonographer:    Irving Burton Senior RDCS Sonographer#2:  Lucendia Herrlich Referring Phys: [phone] MIHAI CROITORU  IMPRESSIONS   1. Prominent false tendon in the LV apex of no clinical significance.. Left ventricular ejection fraction, by estimation, is 60 to 65%. The left ventricle has normal function. The left ventricle demonstrates regional wall motion abnormalities (see scoring diagram/findings for description). Left ventricular diastolic parameters are consistent with Grade I diastolic dysfunction (impaired relaxation). Possible very small focal area of hypokinesis of the left ventricular,  apical segment but no visualized in all views. 2. Right ventricular systolic function is normal. The right ventricular size is normal. There is normal pulmonary artery systolic pressure. The estimated right ventricular systolic pressure is 25.1 mmHg. 3. The mitral valve is normal in structure. Trivial mitral valve regurgitation. No evidence of mitral stenosis. 4. The aortic valve is tricuspid. Aortic valve regurgitation is mild. Aortic valve sclerosis/calcification is present, without any evidence of aortic stenosis. 5. Aortic dilatation noted. There is borderline dilatation of the aortic root, measuring 37 mm. There is mild dilatation of the ascending aorta, measuring 38 mm. 6. The inferior vena cava is normal in size with greater than 50% respiratory variability, suggesting right atrial pressure of 3 mmHg. 7. Recommend repeat limited study with definity contrast.  FINDINGS Left Ventricle: Prominent false tendon in the LV apex of no clinical significance. Left ventricular ejection fraction, by estimation, is 60 to 65%. The left ventricle has normal function. The left ventricle demonstrates regional wall  motion abnormalities. The left ventricular internal cavity size was normal in size. There is no left ventricular hypertrophy. Left ventricular diastolic parameters are consistent with Grade I diastolic dysfunction (impaired relaxation). Normal left ventricular filling pressure.  Right Ventricle: The right ventricular size is normal. No increase in right ventricular wall thickness. Right ventricular systolic function is normal. There is normal pulmonary artery systolic pressure. The tricuspid regurgitant velocity is 2.35 m/s, and with an assumed right atrial pressure of 3 mmHg, the estimated right ventricular systolic pressure is 25.1 mmHg.  Left Atrium: Left atrial size was normal in size.  Right Atrium: Right atrial size was normal in size.  Pericardium: There is no evidence of pericardial effusion.  Mitral Valve: The mitral valve is normal in structure. Trivial mitral valve regurgitation. No evidence of mitral valve stenosis.  Tricuspid Valve: The tricuspid valve is normal in structure. Tricuspid valve regurgitation is mild . No evidence of tricuspid stenosis.  Aortic Valve: The aortic valve is tricuspid. Aortic valve regurgitation is mild. Aortic valve sclerosis/calcification is present, without any evidence of aortic stenosis.  Pulmonic Valve: The pulmonic valve was normal in structure. Pulmonic valve regurgitation is trivial. No evidence of pulmonic stenosis.  Aorta: Aortic dilatation noted. There is borderline dilatation of the aortic root, measuring 37 mm. There is mild dilatation of the ascending aorta, measuring 38 mm.  Venous: The inferior vena cava is normal in size with greater than 50% respiratory variability, suggesting right atrial pressure of 3 mmHg.  IAS/Shunts: No atrial level shunt detected by color flow Doppler.   LEFT VENTRICLE PLAX 2D LVIDd:         4.70 cm   Diastology LVIDs:         3.40 cm   LV e' medial:    4.46 cm/s LV PW:         1.10 cm   LV E/e' medial:   12.5 LV IVS:        1.10 cm   LV e' lateral:   7.29 cm/s LVOT diam:     2.50 cm   LV E/e' lateral: 7.7 LV SV:         127 LV SV Index:   69 LVOT Area:     4.91 cm   RIGHT VENTRICLE RV S prime:     10.00 cm/s TAPSE (M-mode): 2.6 cm  LEFT ATRIUM             Index        RIGHT ATRIUM  Index LA diam:        2.90 cm 1.58 cm/m   RA Area:     20.30 cm LA Vol (A2C):   46.7 ml 25.45 ml/m  RA Volume:   57.90 ml  31.55 ml/m LA Vol (A4C):   38.7 ml 21.09 ml/m LA Biplane Vol: 45.9 ml 25.01 ml/m AORTIC VALVE LVOT Vmax:         101.00 cm/s LVOT Vmean:        64.200 cm/s LVOT VTI:          0.259 m AR Vena Contracta: 0.30 cm  AORTA Ao Root diam: 3.70 cm Ao Asc diam:  3.80 cm  MITRAL VALVE               TRICUSPID VALVE MV Area (PHT): 1.22 cm    TR Peak grad:   22.1 mmHg MV Decel Time: 624 msec    TR Vmax:        235.00 cm/s MV E velocity: 55.80 cm/s MV A velocity: 76.10 cm/s  SHUNTS MV E/A ratio:  0.73        Systemic VTI:  0.26 m Systemic Diam: 2.50 cm  Armanda Magic MD Electronically signed by Armanda Magic MD Signature Date/Time: 02/27/2022/10:52:52 AM    Final    MONITORS  LONG TERM MONITOR (3-14 DAYS) 11/11/2021  Narrative Patch Wear Time:  13 days and 22 hours (2023-05-13T12:12:45-0400 to 2023-05-27T11:09:54-0400)  Patient had a min HR of 31 bpm, max HR of 146 bpm, and avg HR of 53 bpm. Predominant underlying rhythm was Sinus Rhythm. First Degree AV Block was present. 2 Supraventricular Tachycardia runs occurred, the run with the fastest interval lasting 9 beats with a max rate of 146 bpm (avg 135 bpm); the run with the fastest interval was also the longest. Isolated SVEs were rare (<1.0%), SVE Couplets were rare (<1.0%), and SVE Triplets were rare (<1.0%). Isolated VEs were rare (<1.0%), and no VE Couplets or VE Triplets were present.  SUMMARY: 1. The basic rhythm is normal sinus with an average HR of 53 bpm 2. No atrial fibrillation or flutter 3. No  high-grade heart block 4. There are rare PVC's and rare supraventricular beats, both occurring less than 1% burden 5. The sinus rate is as low as 34 bpm without AV block or junctional beats seen 6. Rare supraventricular runs limited to 9 beats for the longest run   CT SCANS  CT CORONARY MORPH W/CTA COR W/SCORE 08/23/2021  Addendum 08/23/2021  6:26 PM ADDENDUM REPORT: 08/23/2021 18:24  CLINICAL DATA:  Chest pain  EXAM: Cardiac/Coronary CTA  TECHNIQUE: A non-contrast, gated CT scan was obtained with axial slices of 3 mm through the heart for calcium scoring. Calcium scoring was performed using the Agatston method. A 120 kV prospective, gated, contrast cardiac scan was obtained. Gantry rotation speed was 250 msecs and collimation was 0.6 mm. Two sublingual nitroglycerin tablets (0.8 mg) were given. The 3D data set was reconstructed in 5% intervals of the 35-75% of the R-R cycle. Diastolic phases were analyzed on a dedicated workstation using MPR, MIP, and VRT modes. The patient received 95 cc of contrast.  FINDINGS: Image quality: Excellent.  Noise artifact is: Limited.  Coronary Arteries:  Normal coronary origin.  Right dominance.  Left main: The left main is a large caliber vessel with a normal take off from the left coronary cusp that bifurcates to form a left anterior descending artery and a left circumflex artery. There is mild calcified plaque in  the proximal LM with associated stenosis of 25-49%.  Left anterior descending artery: The LAD is patent without evidence of plaque or stenosis. The LAD gives off 1 large branching diagonal. There is mild calcified plaque in the proximal LAD with associated stenosis of 25-49%. There is mild soft plaque in the mid LAD with associated stenosis of 25-49%.  Left circumflex artery: The LCX is non-dominant. The LCX gives off 2 patent obtuse marginal branches. There is mild calcified plaque in the proximal LCx with associated  stenosis of 25-49%.  Right coronary artery: The RCA is dominant with normal take off from the right coronary cusp. There is no evidence of plaque or stenosis. The RCA terminates as a PDA and right posterolateral branch without evidence of plaque or stenosis.  Right Atrium: Right atrial size is within normal limits.  Right Ventricle: The right ventricular cavity is within normal limits.  Left Atrium: Left atrial size is normal in size with no left atrial appendage filling defect.  Left Ventricle: The ventricular cavity size is within normal limits. There are no stigmata of prior infarction. There is no abnormal filling defect.  Pulmonary arteries: Normal in size without proximal filling defect.  Pulmonary veins: Normal pulmonary venous drainage.  Pericardium: Normal thickness with no significant effusion or calcium present.  Cardiac valves: The aortic valve is trileaflet without significant calcification. The mitral valve is normal structure without significant calcification.  Aorta: Normal caliber with no significant disease.  Extra-cardiac findings: See attached radiology report for non-cardiac structures.  IMPRESSION: 1. Coronary calcium score of 149. This was 38th percentile for age-, sex, and race-matched controls.  2.  Normal coronary origin with right dominance.  3.  Mild atherosclerosis.  CAD RADS 2.  4.  Recommend preventive therapy and risk factor modification.  5.  Consider non atherosclerotic causes of chest pain.  RECOMMENDATIONS: 1. CAD-RADS 0: No evidence of CAD (0%). Consider non-atherosclerotic causes of chest pain.  2. CAD-RADS 1: Minimal non-obstructive CAD (0-24%). Consider non-atherosclerotic causes of chest pain. Consider preventive therapy and risk factor modification.  3. CAD-RADS 2: Mild non-obstructive CAD (25-49%). Consider non-atherosclerotic causes of chest pain. Consider preventive therapy and risk factor modification.  4.  CAD-RADS 3: Moderate stenosis. Consider symptom-guided anti-ischemic pharmacotherapy as well as risk factor modification per guideline directed care. Additional analysis with CT FFR will be submitted.  5. CAD-RADS 4: Severe stenosis. (70-99% or > 50% left main). Cardiac catheterization or CT FFR is recommended. Consider symptom-guided anti-ischemic pharmacotherapy as well as risk factor modification per guideline directed care. Invasive coronary angiography recommended with revascularization per published guideline statements.  6. CAD-RADS 5: Total coronary occlusion (100%). Consider cardiac catheterization or viability assessment. Consider symptom-guided anti-ischemic pharmacotherapy as well as risk factor modification per guideline directed care.  7. CAD-RADS N: Non-diagnostic study. Obstructive CAD can't be excluded. Alternative evaluation is recommended.  Armanda Magic, MD   Electronically Signed By: Armanda Magic M.D. On: 08/23/2021 18:24  Narrative EXAM: OVER-READ INTERPRETATION  CT CHEST  The following report is an over-read performed by radiologist Dr. Charlett Nose of [facility] on 08/23/2021. This over-read does not include interpretation of cardiac or coronary anatomy or pathology. The coronary CTA interpretation by the cardiologist is attached.  COMPARISON:  06/18/2017  FINDINGS: Vascular: Heart is normal size.  Aorta normal caliber.  Mediastinum/Nodes: No adenopathy  Lungs/Pleura: No confluent opacities or effusions.  Upper Abdomen: No acute findings  Musculoskeletal: Mild bilateral gynecomastia. No acute bony abnormality.  IMPRESSION: No acute extra  cardiac abnormality.  Electronically Signed: By: Charlett Nose M.D. On: 08/23/2021 17:08   CT SCANS  CT CORONARY MORPH W/CTA COR W/SCORE 06/18/2017  Addendum 06/18/2017  6:17 PM ADDENDUM REPORT: 06/18/2017 18:15  CLINICAL DATA:  Chest pain  EXAM: Cardiac CTA  MEDICATIONS: Sub  lingual nitro. 4mg   TECHNIQUE: The patient was scanned on a Siemens 192 slice scanner. Gantry rotation speed was 240 msecs. Collimation was 0.6 mm. A 100 kV prospective scan was triggered in the ascending thoracic aorta at 35-75% of the R-R interval. Average HR during the scan was 55 bpm. The 3D data set was interpreted on a dedicated work station using MPR, MIP and VRT modes. A total of 80cc of contrast was used.  FINDINGS: Non-cardiac: See separate report from [facility].  Calcium Score:  90 Agatston units  Coronary Arteries: Right dominant with no anomalies  LM:  Calcified plaque with mild stenosis.  LAD system: Moderate D1 without significant disease. Mixed plaque in the proximal LAD with mild stenosis. The proximal LAD then passes intramyocardially (bridging) for a short segment.  Circumflex system: Mixed plaque with mild stenosis in the proximal vessel.  RCA system:  No significant disease.  IMPRESSION: 1. Coronary artery calcium score 90 Agatston units placing the patient in the 38th percentile for age and gender.  2.  Nonobstructive coronary disease.  3.  Small intramyocardial segment in the proximal LAD.  Dalton Mclean   Electronically Signed By: Marca Ancona M.D. On: 06/18/2017 18:15  Narrative EXAM: OVER-READ INTERPRETATION  CT CHEST  The following report is an over-read performed by radiologist Dr. Royal P[facility] on 06/18/2017. This over-read does not include interpretation of cardiac or coronary anatomy or pathology. The coronary calcium score/coronary CTA interpretation by the cardiologist is attached.  COMPARISON:  Chest CT 04/02/2011.  FINDINGS: Aortic atherosclerosis. Within the visualized portions of the thorax there are no suspicious appearing pulmonary nodules or masses, there is no acute consolidative airspace disease, no pleural effusions, no pneumothorax and no lymphadenopathy. Visualized portions  of the upper abdomen are unremarkable. There are no aggressive appearing lytic or blastic lesions noted in the visualized portions of the skeleton.  IMPRESSION: 1.  Aortic Atherosclerosis (ICD10-I70.0).  Electronically Signed: By: Trudie Reed M.D. On: 06/18/2017 10:58          EKG:   EKG Interpretation Date/Time:  Thursday April 19 2023 11:05:52 EST Ventricular Rate:  42 PR Interval:  264 QRS Duration:  124 QT Interval:  476 QTC Calculation: 397 R Axis:   -23  Text Interpretation: Marked sinus bradycardia with 1st degree A-V block Non-specific intra-ventricular conduction delay Minimal voltage criteria for LVH, may be normal variant ( Cornell product ) When compared with ECG of 09-Feb-2023 10:40, Questionable change in QRS axis Confirmed by Tonny Bollman [phone]) on 04/19/2023 11:12:30 AM    Recent Labs: 06/15/2022: Magnesium 2.2; TSH 1.804 11/13/2022: ALT 16 12/15/2022: BUN 20; Creatinine, Ser 1.33; Hemoglobin 12.9; Platelet Count 213; Potassium 4.1; Sodium 139  Recent Lipid Panel    Component Value Date/Time   CHOL 116 11/13/2022 1255   TRIG 55 11/13/2022 1255   HDL 43 11/13/2022 1255   CHOLHDL 2.7 11/13/2022 1255   CHOLHDL 4.1 02/27/2022 0129   VLDL 27 02/27/2022 0129   LDLCALC 61 11/13/2022 1255     Risk Assessment/Calculations:                Physical Exam:    VS:  BP 90/60   Pulse Marland Kitchen)  42   Ht 5\' 6"  (1.676 m)   Wt 167 lb 9.6 oz (76 kg)   SpO2 96%   BMI 27.05 kg/m     Wt Readings from Last 3 Encounters:  04/19/23 167 lb 9.6 oz (76 kg)  02/28/23 166 lb 1.6 oz (75.3 kg)  02/09/23 165 lb 6.4 oz (75 kg)     GEN:  Well nourished, well developed in no acute distress HEENT: Normal NECK: No JVD; No carotid bruits LYMPHATICS: No lymphadenopathy CARDIAC: Bradycardic and regular with occasional premature beats, no murmurs, rubs, gallops RESPIRATORY:  Clear to auscultation without rales, wheezing or rhonchi  ABDOMEN: Soft, non-tender,  non-distended MUSCULOSKELETAL:  No edema; No deformity  SKIN: Warm and dry NEUROLOGIC:  Alert and oriented x 3 PSYCHIATRIC:  Normal affect   Assessment & Plan Coronary artery disease involving native coronary artery of native heart with angina pectoris (HCC) As outlined above, we reviewed his cardiac catheterization films today.  I compared his catheterization images from September 2023 to his images performed at the outside hospital in June 2014.  I do not appreciate any significant changes.  Even though he has apical ischemia on his PET scan, the result is felt to be low risk and I think it may be related to his microvascular dysfunction.  His LAD has mild diffuse disease as well as an intramyocardial segment.  There is no high-grade obstruction throughout the course of the LAD on his recent heart catheterization.  Fortunately he is not having any further angina at present.  He should remain on his current antianginal program.  He was not able to tolerate ranolazine due to severe constipation.  He will continue on low-dose amlodipine.  He is not a candidate for beta-blocker because of marked bradycardia. Mixed hyperlipidemia Treated with rosuvastatin.  LDL cholesterol is 61 Bradycardia Stable sinus bradycardia noted today PVC (premature ventricular contraction) He is experiencing frequent PVCs.  I have recommended a 3-day ZIO monitor Palpitations As above, check 3-day event monitor HFrEF (heart failure with reduced ejection fraction) (HCC) LVEF reduced at 44% on his PET scan, increased to only 50% with stress.  Recommend an echocardiogram to further assess and help guide his medical therapy.  His LVEF by echo last year was 60 to 65%.      Medication Adjustments/Labs and Tests Ordered: Current medicines are reviewed at length with the patient today.  Concerns regarding medicines are outlined above.  Orders Placed This Encounter  Procedures   EKG 12-Lead   ECHOCARDIOGRAM COMPLETE   No  orders of the defined types were placed in this encounter.   Patient Instructions  Medication Instructions:  Your physician recommends that you continue on your current medications as directed. Please refer to the Current Medication list given to you today.  *If you need a refill on your cardiac medications before your next appointment, please call your pharmacy*   Lab Work: None ordered  If you have labs (blood work) drawn today and your tests are completely normal, you will receive your results only by: MyChart Message (if you have MyChart) OR A paper copy in the mail If you have any lab test that is abnormal or we need to change your treatment, we will call you to review the results.   Testing/Procedures: Your physician has requested that you have an echocardiogram. Echocardiography is a painless test that uses sound waves to create images of your heart. It provides your doctor with information about the size and shape of your heart  and how well your heart's chambers and valves are working. This procedure takes approximately one hour. There are no restrictions for this procedure. Please do NOT wear cologne, perfume, aftershave, or lotions (deodorant is allowed). Please arrive 15 minutes prior to your appointment time.  Please note: We ask at that you not bring children with you during ultrasound (echo/ vascular) testing. Due to room size and safety concerns, children are not allowed in the ultrasound rooms during exams. Our front office staff cannot provide observation of children in our lobby area while testing is being conducted. An adult accompanying a patient to their appointment will only be allowed in the ultrasound room at the discretion of the ultrasound technician under special circumstances. We apologize for any inconvenience.   ZIO XT- Long Term Monitor Instructions  Your physician has requested you wear a ZIO patch monitor for 3 days.  This is a single patch monitor.  [employer] supplies one patch monitor per enrollment. Additional stickers are not available. Please do not apply patch if you will be having a Nuclear Stress Test,  Echocardiogram, Cardiac CT, MRI, or Chest Xray during the period you would be wearing the  monitor. The patch cannot be worn during these tests. You cannot remove and re-apply the  ZIO XT patch monitor.  Your ZIO patch monitor will be mailed 3 day USPS to your address on file. It may take 3-5 days  to receive your monitor after you have been enrolled.  Once you have received your monitor, please review the enclosed instructions. Your monitor  has already been registered assigning a specific monitor serial # to you.  Billing and Patient Assistance Program Information  We have supplied [employer] with any of your insurance information on file for billing purposes. [employer] offers a sliding scale Patient Assistance Program for patients that do not have  insurance, or whose insurance does not completely cover the cost of the ZIO monitor.  You must apply for the Patient Assistance Program to qualify for this discounted rate.  To apply, please call [employer] at [phone], select option 4, select option 2, ask to apply for  Patient Assistance Program. Meredeth Ide will ask your household income, and how many people  are in your household. They will quote your out-of-pocket cost based on that information.  [employer] will also be able to set up a 7-month, interest-free payment plan if needed.  Applying the monitor   Shave hair from upper left chest.  Hold abrader disc by orange tab. Rub abrader in 40 strokes over the upper left chest as  indicated in your monitor instructions.  Clean area with 4 enclosed alcohol pads. Let dry.  Apply patch as indicated in monitor instructions. Patch will be placed under collarbone on left  side of chest with arrow pointing upward.  Rub patch adhesive wings for 2 minutes. Remove white label marked "1". Remove the  white  label marked "2". Rub patch adhesive wings for 2 additional minutes.  While looking in a mirror, press and release button in center of patch. A small green light will  flash 3-4 times. This will be your only indicator that the monitor has been turned on.  Do not shower for the first 24 hours. You may shower after the first 24 hours.  Press the button if you feel a symptom. You will hear a small click. Record Date, Time and  Symptom in the Patient Logbook.  When you are ready to remove the patch, follow instructions on the last  2 pages of Patient  Logbook. Stick patch monitor onto the last page of Patient Logbook.  Place Patient Logbook in the blue and white box. Use locking tab on box and tape box closed  securely. The blue and white box has prepaid postage on it. Please place it in the mailbox as  soon as possible. Your physician should have your test results approximately 7 days after the  monitor has been mailed back to [facility].  Call [facility] Customer Care at [phone] if you have questions regarding  your ZIO XT patch monitor. Call them immediately if you see an orange light blinking on your  monitor.  If your monitor falls off in less than 4 days, contact our Monitor department at [phone].  If your monitor becomes loose or falls off after 4 days call [employer] at [phone] for  suggestions on securing your monitor    Follow-Up: At [facility], you and your health needs are our priority.  As part of our continuing mission to provide you with exceptional heart care, we have created designated Provider Care Teams.  These Care Teams include your primary Cardiologist (physician) and Advanced Practice Providers (APPs -  Physician Assistants and Nurse Practitioners) who all work together to provide you with the care you need, when you need it.  We recommend signing up for the patient portal called "MyChart".  Sign up information is provided on this  After Visit Summary.  MyChart is used to connect with patients for Virtual Visits (Telemedicine).  Patients are able to view lab/test results, encounter notes, upcoming appointments, etc.  Non-urgent messages can be sent to your provider as well.   To learn more about what you can do with MyChart, go to [email or web address].    Your next appointment:   6 month(s)  Provider:   Tonny Bollman, MD     Other Instructions    Signed, Tonny Bollman, MD  04/19/2023 1:02 PM    [facility]

## 2023-04-19 NOTE — Assessment & Plan Note (Signed)
Treated with rosuvastatin.  LDL cholesterol is 61

## 2023-04-19 NOTE — Progress Notes (Unsigned)
Enrolled for [employer] to mail a ZIO XT long term holter monitor to the patients address on file.  

## 2023-04-19 NOTE — Patient Instructions (Signed)
Medication Instructions:  Your physician recommends that you continue on your current medications as directed. Please refer to the Current Medication list given to you today.  *If you need a refill on your cardiac medications before your next appointment, please call your pharmacy*   Lab Work: None ordered  If you have labs (blood work) drawn today and your tests are completely normal, you will receive your results only by: MyChart Message (if you have MyChart) OR A paper copy in the mail If you have any lab test that is abnormal or we need to change your treatment, we will call you to review the results.   Testing/Procedures: Your physician has requested that you have an echocardiogram. Echocardiography is a painless test that uses sound waves to create images of your heart. It provides your doctor with information about the size and shape of your heart and how well your heart's chambers and valves are working. This procedure takes approximately one hour. There are no restrictions for this procedure. Please do NOT wear cologne, perfume, aftershave, or lotions (deodorant is allowed). Please arrive 15 minutes prior to your appointment time.  Please note: We ask at that you not bring children with you during ultrasound (echo/ vascular) testing. Due to room size and safety concerns, children are not allowed in the ultrasound rooms during exams. Our front office staff cannot provide observation of children in our lobby area while testing is being conducted. An adult accompanying a patient to their appointment will only be allowed in the ultrasound room at the discretion of the ultrasound technician under special circumstances. We apologize for any inconvenience.   ZIO XT- Long Term Monitor Instructions  Your physician has requested you wear a ZIO patch monitor for 3 days.  This is a single patch monitor. [employer] supplies one patch monitor per enrollment. Additional stickers are not available.  Please do not apply patch if you will be having a Nuclear Stress Test,  Echocardiogram, Cardiac CT, MRI, or Chest Xray during the period you would be wearing the  monitor. The patch cannot be worn during these tests. You cannot remove and re-apply the  ZIO XT patch monitor.  Your ZIO patch monitor will be mailed 3 day USPS to your address on file. It may take 3-5 days  to receive your monitor after you have been enrolled.  Once you have received your monitor, please review the enclosed instructions. Your monitor  has already been registered assigning a specific monitor serial # to you.  Billing and Patient Assistance Program Information  We have supplied [employer] with any of your insurance information on file for billing purposes. [employer] offers a sliding scale Patient Assistance Program for patients that do not have  insurance, or whose insurance does not completely cover the cost of the ZIO monitor.  You must apply for the Patient Assistance Program to qualify for this discounted rate.  To apply, please call [employer] at [phone], select option 4, select option 2, ask to apply for  Patient Assistance Program. Meredeth Ide will ask your household income, and how many people  are in your household. They will quote your out-of-pocket cost based on that information.  [employer] will also be able to set up a 22-month, interest-free payment plan if needed.  Applying the monitor   Shave hair from upper left chest.  Hold abrader disc by orange tab. Rub abrader in 40 strokes over the upper left chest as  indicated in your monitor instructions.  Clean area with 4  enclosed alcohol pads. Let dry.  Apply patch as indicated in monitor instructions. Patch will be placed under collarbone on left  side of chest with arrow pointing upward.  Rub patch adhesive wings for 2 minutes. Remove white label marked "1". Remove the white  label marked "2". Rub patch adhesive wings for 2 additional minutes.  While  looking in a mirror, press and release button in center of patch. A small green light will  flash 3-4 times. This will be your only indicator that the monitor has been turned on.  Do not shower for the first 24 hours. You may shower after the first 24 hours.  Press the button if you feel a symptom. You will hear a small click. Record Date, Time and  Symptom in the Patient Logbook.  When you are ready to remove the patch, follow instructions on the last 2 pages of Patient  Logbook. Stick patch monitor onto the last page of Patient Logbook.  Place Patient Logbook in the blue and white box. Use locking tab on box and tape box closed  securely. The blue and white box has prepaid postage on it. Please place it in the mailbox as  soon as possible. Your physician should have your test results approximately 7 days after the  monitor has been mailed back to [facility].  Call [facility] Customer Care at [phone] if you have questions regarding  your ZIO XT patch monitor. Call them immediately if you see an orange light blinking on your  monitor.  If your monitor falls off in less than 4 days, contact our Monitor department at [phone].  If your monitor becomes loose or falls off after 4 days call [employer] at [phone] for  suggestions on securing your monitor    Follow-Up: At [facility], you and your health needs are our priority.  As part of our continuing mission to provide you with exceptional heart care, we have created designated Provider Care Teams.  These Care Teams include your primary Cardiologist (physician) and Advanced Practice Providers (APPs -  Physician Assistants and Nurse Practitioners) who all work together to provide you with the care you need, when you need it.  We recommend signing up for the patient portal called "MyChart".  Sign up information is provided on this After Visit Summary.  MyChart is used to connect with patients for Virtual Visits  (Telemedicine).  Patients are able to view lab/test results, encounter notes, upcoming appointments, etc.  Non-urgent messages can be sent to your provider as well.   To learn more about what you can do with MyChart, go to [email or web address].    Your next appointment:   6 month(s)  Provider:   Tonny Bollman, MD     Other Instructions

## 2023-04-19 NOTE — Assessment & Plan Note (Signed)
As above, check 3-day event monitor

## 2023-04-20 DIAGNOSIS — I87391 Chronic venous hypertension (idiopathic) with other complications of right lower extremity: Secondary | ICD-10-CM | POA: Diagnosis not present

## 2023-04-20 DIAGNOSIS — I83891 Varicose veins of right lower extremities with other complications: Secondary | ICD-10-CM | POA: Diagnosis not present

## 2023-04-20 DIAGNOSIS — Z09 Encounter for follow-up examination after completed treatment for conditions other than malignant neoplasm: Secondary | ICD-10-CM | POA: Diagnosis not present

## 2023-04-23 ENCOUNTER — Ambulatory Visit: Payer: Medicare Other | Attending: Cardiovascular Disease

## 2023-04-23 DIAGNOSIS — Z79899 Other long term (current) drug therapy: Secondary | ICD-10-CM | POA: Diagnosis not present

## 2023-04-23 DIAGNOSIS — E785 Hyperlipidemia, unspecified: Secondary | ICD-10-CM | POA: Diagnosis not present

## 2023-04-23 LAB — ALT: ALT: 17 [IU]/L (ref 0–44)

## 2023-04-23 LAB — LIPID PANEL
Chol/HDL Ratio: 2.6 ratio (ref 0.0–5.0)
Cholesterol, Total: 118 mg/dL (ref 100–199)
HDL: 45 mg/dL (ref >39)
LDL Chol Calc (NIH): 60 mg/dL (ref 0–99)
Triglycerides: 56 mg/dL (ref 0–149)
VLDL Cholesterol Cal: 13 mg/dL (ref 5–40)

## 2023-04-25 ENCOUNTER — Ambulatory Visit: Payer: Medicare Other

## 2023-04-25 DIAGNOSIS — I25119 Atherosclerotic heart disease of native coronary artery with unspecified angina pectoris: Secondary | ICD-10-CM | POA: Diagnosis not present

## 2023-04-25 DIAGNOSIS — R002 Palpitations: Secondary | ICD-10-CM | POA: Diagnosis not present

## 2023-04-25 DIAGNOSIS — R001 Bradycardia, unspecified: Secondary | ICD-10-CM

## 2023-04-25 DIAGNOSIS — I5020 Unspecified systolic (congestive) heart failure: Secondary | ICD-10-CM | POA: Diagnosis not present

## 2023-04-25 DIAGNOSIS — I493 Ventricular premature depolarization: Secondary | ICD-10-CM

## 2023-04-26 DIAGNOSIS — Z01118 Encounter for examination of ears and hearing with other abnormal findings: Secondary | ICD-10-CM | POA: Diagnosis not present

## 2023-04-26 DIAGNOSIS — H903 Sensorineural hearing loss, bilateral: Secondary | ICD-10-CM | POA: Diagnosis not present

## 2023-04-26 DIAGNOSIS — I252 Old myocardial infarction: Secondary | ICD-10-CM | POA: Diagnosis not present

## 2023-04-26 DIAGNOSIS — H9313 Tinnitus, bilateral: Secondary | ICD-10-CM | POA: Diagnosis not present

## 2023-04-26 DIAGNOSIS — R252 Cramp and spasm: Secondary | ICD-10-CM | POA: Diagnosis not present

## 2023-04-26 DIAGNOSIS — H6123 Impacted cerumen, bilateral: Secondary | ICD-10-CM | POA: Diagnosis not present

## 2023-04-30 ENCOUNTER — Ambulatory Visit: Payer: Medicare Other | Admitting: Cardiovascular Disease

## 2023-05-02 DIAGNOSIS — I83891 Varicose veins of right lower extremities with other complications: Secondary | ICD-10-CM | POA: Diagnosis not present

## 2023-05-02 DIAGNOSIS — I87391 Chronic venous hypertension (idiopathic) with other complications of right lower extremity: Secondary | ICD-10-CM | POA: Diagnosis not present

## 2023-05-04 DIAGNOSIS — R002 Palpitations: Secondary | ICD-10-CM | POA: Diagnosis not present

## 2023-05-04 DIAGNOSIS — R001 Bradycardia, unspecified: Secondary | ICD-10-CM | POA: Diagnosis not present

## 2023-05-08 DIAGNOSIS — D485 Neoplasm of uncertain behavior of skin: Secondary | ICD-10-CM | POA: Diagnosis not present

## 2023-05-08 DIAGNOSIS — L718 Other rosacea: Secondary | ICD-10-CM | POA: Diagnosis not present

## 2023-05-08 DIAGNOSIS — L280 Lichen simplex chronicus: Secondary | ICD-10-CM | POA: Diagnosis not present

## 2023-05-08 DIAGNOSIS — L821 Other seborrheic keratosis: Secondary | ICD-10-CM | POA: Diagnosis not present

## 2023-05-08 DIAGNOSIS — Z85828 Personal history of other malignant neoplasm of skin: Secondary | ICD-10-CM | POA: Diagnosis not present

## 2023-05-08 DIAGNOSIS — L820 Inflamed seborrheic keratosis: Secondary | ICD-10-CM | POA: Diagnosis not present

## 2023-05-16 ENCOUNTER — Telehealth: Payer: Self-pay

## 2023-05-16 NOTE — Telephone Encounter (Signed)
Attempted to contact patient concerning recent monitor results. Left results on voicemail (DPR on file)   ----- Message from Tonny Bollman sent at 05/13/2023  6:00 PM EST ----- Findings and data personally reviewed.  Patient with known longstanding history of marked sinus bradycardia.  No high-grade heart block noted. May report results to patient. thanks

## 2023-05-16 NOTE — Telephone Encounter (Signed)
-----   Message from Tonny Bollman sent at 05/13/2023  6:00 PM EST ----- Findings and data personally reviewed.  Patient with known longstanding history of marked sinus bradycardia.  No high-grade heart block noted. May report results to patient. thanks

## 2023-05-23 ENCOUNTER — Other Ambulatory Visit: Payer: Self-pay | Admitting: Cardiovascular Disease

## 2023-05-25 ENCOUNTER — Other Ambulatory Visit: Payer: Self-pay | Admitting: Cardiovascular Disease

## 2023-05-29 DIAGNOSIS — I83891 Varicose veins of right lower extremities with other complications: Secondary | ICD-10-CM | POA: Diagnosis not present

## 2023-05-31 ENCOUNTER — Ambulatory Visit (HOSPITAL_COMMUNITY): Payer: Medicare Other | Attending: Cardiovascular Disease

## 2023-05-31 DIAGNOSIS — I25119 Atherosclerotic heart disease of native coronary artery with unspecified angina pectoris: Secondary | ICD-10-CM | POA: Insufficient documentation

## 2023-05-31 DIAGNOSIS — I493 Ventricular premature depolarization: Secondary | ICD-10-CM | POA: Diagnosis not present

## 2023-05-31 DIAGNOSIS — R002 Palpitations: Secondary | ICD-10-CM | POA: Diagnosis not present

## 2023-05-31 DIAGNOSIS — R001 Bradycardia, unspecified: Secondary | ICD-10-CM | POA: Insufficient documentation

## 2023-05-31 DIAGNOSIS — E782 Mixed hyperlipidemia: Secondary | ICD-10-CM | POA: Insufficient documentation

## 2023-05-31 DIAGNOSIS — I5020 Unspecified systolic (congestive) heart failure: Secondary | ICD-10-CM | POA: Diagnosis not present

## 2023-05-31 LAB — ECHOCARDIOGRAM COMPLETE
AR max vel: 2.32 cm2
AV Area VTI: 2.52 cm2
AV Area mean vel: 2.53 cm2
AV Mean grad: 5 mm[Hg]
AV Peak grad: 10.6 mm[Hg]
Ao pk vel: 1.63 m/s
Area-P 1/2: 2.97 cm2
P 1/2 time: 754 ms
S' Lateral: 3 cm

## 2023-06-20 DIAGNOSIS — L821 Other seborrheic keratosis: Secondary | ICD-10-CM | POA: Diagnosis not present

## 2023-06-20 DIAGNOSIS — L820 Inflamed seborrheic keratosis: Secondary | ICD-10-CM | POA: Diagnosis not present

## 2023-06-20 DIAGNOSIS — L738 Other specified follicular disorders: Secondary | ICD-10-CM | POA: Diagnosis not present

## 2023-06-20 DIAGNOSIS — L309 Dermatitis, unspecified: Secondary | ICD-10-CM | POA: Diagnosis not present

## 2023-06-20 DIAGNOSIS — L570 Actinic keratosis: Secondary | ICD-10-CM | POA: Diagnosis not present

## 2023-06-20 DIAGNOSIS — Z85828 Personal history of other malignant neoplasm of skin: Secondary | ICD-10-CM | POA: Diagnosis not present

## 2023-06-25 DIAGNOSIS — I872 Venous insufficiency (chronic) (peripheral): Secondary | ICD-10-CM | POA: Diagnosis not present

## 2023-06-28 DIAGNOSIS — M1711 Unilateral primary osteoarthritis, right knee: Secondary | ICD-10-CM | POA: Diagnosis not present

## 2023-07-04 DIAGNOSIS — I83893 Varicose veins of bilateral lower extremities with other complications: Secondary | ICD-10-CM | POA: Diagnosis not present

## 2023-07-18 DIAGNOSIS — I83892 Varicose veins of left lower extremities with other complications: Secondary | ICD-10-CM | POA: Diagnosis not present

## 2023-07-18 DIAGNOSIS — I83812 Varicose veins of left lower extremities with pain: Secondary | ICD-10-CM | POA: Diagnosis not present

## 2023-07-20 DIAGNOSIS — L299 Pruritus, unspecified: Secondary | ICD-10-CM | POA: Diagnosis not present

## 2023-07-20 DIAGNOSIS — K4090 Unilateral inguinal hernia, without obstruction or gangrene, not specified as recurrent: Secondary | ICD-10-CM | POA: Diagnosis not present

## 2023-08-01 DIAGNOSIS — M7989 Other specified soft tissue disorders: Secondary | ICD-10-CM | POA: Diagnosis not present

## 2023-08-01 DIAGNOSIS — I83812 Varicose veins of left lower extremities with pain: Secondary | ICD-10-CM | POA: Diagnosis not present

## 2023-08-01 DIAGNOSIS — I83892 Varicose veins of left lower extremities with other complications: Secondary | ICD-10-CM | POA: Diagnosis not present

## 2023-08-02 DIAGNOSIS — L111 Transient acantholytic dermatosis [Grover]: Secondary | ICD-10-CM | POA: Diagnosis not present

## 2023-08-02 DIAGNOSIS — Z85828 Personal history of other malignant neoplasm of skin: Secondary | ICD-10-CM | POA: Diagnosis not present

## 2023-08-02 DIAGNOSIS — L309 Dermatitis, unspecified: Secondary | ICD-10-CM | POA: Diagnosis not present

## 2023-08-02 DIAGNOSIS — L821 Other seborrheic keratosis: Secondary | ICD-10-CM | POA: Diagnosis not present

## 2023-08-14 ENCOUNTER — Inpatient Hospital Stay: Payer: Medicare Other | Attending: Oncology | Admitting: Oncology

## 2023-08-14 ENCOUNTER — Other Ambulatory Visit: Payer: Self-pay

## 2023-08-14 ENCOUNTER — Encounter: Payer: Self-pay | Admitting: Oncology

## 2023-08-14 ENCOUNTER — Telehealth: Payer: Self-pay | Admitting: *Deleted

## 2023-08-14 ENCOUNTER — Inpatient Hospital Stay: Payer: Self-pay

## 2023-08-14 ENCOUNTER — Other Ambulatory Visit: Payer: Medicare Other

## 2023-08-14 VITALS — BP 109/60 | HR 43 | Temp 98.1°F | Resp 18 | Ht 66.0 in | Wt 165.8 lb

## 2023-08-14 DIAGNOSIS — Z862 Personal history of diseases of the blood and blood-forming organs and certain disorders involving the immune mechanism: Secondary | ICD-10-CM

## 2023-08-14 DIAGNOSIS — D649 Anemia, unspecified: Secondary | ICD-10-CM | POA: Diagnosis not present

## 2023-08-14 DIAGNOSIS — N400 Enlarged prostate without lower urinary tract symptoms: Secondary | ICD-10-CM | POA: Insufficient documentation

## 2023-08-14 LAB — CMP (CANCER CENTER ONLY)
ALT: 12 U/L (ref 0–44)
AST: 15 U/L (ref 15–41)
Albumin: 4.6 g/dL (ref 3.5–5.0)
Alkaline Phosphatase: 46 U/L (ref 38–126)
Anion gap: 10 (ref 5–15)
BUN: 20 mg/dL (ref 8–23)
CO2: 26 mmol/L (ref 22–32)
Calcium: 9.2 mg/dL (ref 8.9–10.3)
Chloride: 111 mmol/L (ref 98–111)
Creatinine: 1.41 mg/dL — ABNORMAL HIGH (ref 0.61–1.24)
GFR, Estimated: 51 mL/min — ABNORMAL LOW (ref >60)
Glucose, Bld: 90 mg/dL (ref 70–99)
Potassium: 4.5 mmol/L (ref 3.5–5.1)
Sodium: 147 mmol/L — ABNORMAL HIGH (ref 135–145)
Total Bilirubin: 0.6 mg/dL (ref 0.0–1.2)
Total Protein: 6.3 g/dL — ABNORMAL LOW (ref 6.5–8.1)

## 2023-08-14 LAB — CBC WITH DIFFERENTIAL (CANCER CENTER ONLY)
Abs Immature Granulocytes: 0 10*3/uL (ref 0.00–0.07)
Basophils Absolute: 0 10*3/uL (ref 0.0–0.1)
Basophils Relative: 1 %
Eosinophils Absolute: 0.1 10*3/uL (ref 0.0–0.5)
Eosinophils Relative: 3 %
HCT: 37 % — ABNORMAL LOW (ref 39.0–52.0)
Hemoglobin: 12 g/dL — ABNORMAL LOW (ref 13.0–17.0)
Immature Granulocytes: 0 %
Lymphocytes Relative: 18 %
Lymphs Abs: 0.7 10*3/uL (ref 0.7–4.0)
MCH: 29.2 pg (ref 26.0–34.0)
MCHC: 32.4 g/dL (ref 30.0–36.0)
MCV: 90 fL (ref 80.0–100.0)
Monocytes Absolute: 0.5 10*3/uL (ref 0.1–1.0)
Monocytes Relative: 14 %
Neutro Abs: 2.4 10*3/uL (ref 1.7–7.7)
Neutrophils Relative %: 64 %
Platelet Count: 202 10*3/uL (ref 150–400)
RBC: 4.11 MIL/uL — ABNORMAL LOW (ref 4.22–5.81)
RDW: 13.8 % (ref 11.5–15.5)
WBC Count: 3.8 10*3/uL — ABNORMAL LOW (ref 4.0–10.5)
nRBC: 0 % (ref 0.0–0.2)

## 2023-08-14 LAB — FERRITIN: Ferritin: 75 ng/mL (ref 24–336)

## 2023-08-14 NOTE — Telephone Encounter (Signed)
 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 Inquired if he wants a nephrology referral or f/u with PCP. He would like a nephrology referral. Dr. Truett Perna will refer to [facility], Dr. Annie Sable or Dr. Zetta Bills. Referral order, demographics and medical records faxed to [phone]

## 2023-08-14 NOTE — Progress Notes (Signed)
  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  He reports a decreased energy level.  He loses easily.  No other bleeding.  He has intermittent chest pain and an irregular heart rate.  He is followed by cardiology.  He has a right inguinal hernia.  This is bothersome and he plans to have surgery.  He has a pruritic rash and has been diagnosed with Grovers disease.  He reports resolution of leg cramps.  Objective:  Vital signs in last 24 hours:  Blood pressure 109/60, pulse (!) 43, temperature 98.1 F (36.7 C), temperature source Temporal, resp. rate 18, height 5\' 6"  (1.676 m), weight 165 lb 12.8 oz (75.2 kg), SpO2 98%.    Lymphatics: Cervical, supraclavicular, axillary, or inguinal nodes Resp: Clear bilaterally Cardio: Regular rate and rhythm GI: No hepatosplenomegaly Vascular: No leg edema  Skin: Fine erythematous rash over the back  Portacath/PICC-without erythema  Lab Results:  Lab Results  Component Value Date   WBC 3.8 (L) 08/14/2023   HGB 12.0 (L) 08/14/2023   HCT 37.0 (L) 08/14/2023   MCV 90.0 08/14/2023   PLT 202 08/14/2023   NEUTROABS 2.4 08/14/2023    CMP  Lab Results  Component Value Date   NA 139 12/15/2022   K 4.1 12/15/2022   CL 106 12/15/2022   CO2 23 12/15/2022   GLUCOSE 110 (H) 12/15/2022   BUN 20 12/15/2022   CREATININE 1.33 (H) 12/15/2022   CALCIUM 9.1 12/15/2022   PROT 6.4 11/13/2022   ALBUMIN 4.2 11/13/2022   AST 24 11/13/2022   ALT 17 04/23/2023   ALKPHOS 50 11/13/2022   BILITOT 0.7 11/13/2022   GFRNONAA 60 (L) 06/15/2022   GFRAA 69 05/16/2017     Medications: I have reviewed the patient's current medications.   Assessment/Plan:  Mild normocytic anemia  Coronary artery disease, NSTEMI September 2023 GERD, laryngeal reflux History of an arrhythmia/sinus bradycardia Migraine headaches with optical component COVID-30 April 2022 COVID-30 April 2022 BPH History of a low IgA level in 2021 Right inguinal hernia 08/14/2023 Renal insufficiency     Disposition: [name] has mild normocytic anemia.  The hemoglobin has not changed significantly over the past 1.5 years.  The anemia could be related to early myelodysplasia.  A low testosterone and renal insufficiency could be contributing.  We decided to continue observation.  He will arrange for a repeat testosterone level.  He will return for a lab visit in 6 months and an office visit in 1 year. He has mild renal sufficiency.  I will recommend he follow-up with his primary provider and consider a nephrology evaluation Thornton Papas, MD  08/14/2023  10:19 AM

## 2023-08-15 DIAGNOSIS — I83892 Varicose veins of left lower extremities with other complications: Secondary | ICD-10-CM | POA: Diagnosis not present

## 2023-08-15 DIAGNOSIS — I87392 Chronic venous hypertension (idiopathic) with other complications of left lower extremity: Secondary | ICD-10-CM | POA: Diagnosis not present

## 2023-08-24 ENCOUNTER — Telehealth: Payer: Self-pay | Admitting: *Deleted

## 2023-08-24 ENCOUNTER — Telehealth: Payer: Self-pay

## 2023-08-24 DIAGNOSIS — K4020 Bilateral inguinal hernia, without obstruction or gangrene, not specified as recurrent: Secondary | ICD-10-CM | POA: Diagnosis not present

## 2023-08-24 NOTE — Telephone Encounter (Signed)
   Name: Nathaniel [name]  DOB: 01/30/1946  MRN: [id number]  Primary Cardiologist: Tonny Bollman, MD   Preoperative team, please contact this patient and set up a phone call appointment for further preoperative risk assessment. Please obtain consent and complete medication review. Thank you for your help.  I confirm that guidance regarding antiplatelet and oral anticoagulation therapy has been completed and, if necessary, noted below.  His Plavix may be held for 5 days prior to his procedure.  His aspirin should be continued throughout the perioperative period.  Please resume Plavix as soon as hemostasis is achieved.  I also confirmed the patient resides in the state of [place]. As per [facility] Medical Board telemedicine laws, the patient must reside in the state in which the provider is licensed.   Ronney Asters, NP 08/24/2023, 4:50 PM [facility]

## 2023-08-24 NOTE — Telephone Encounter (Signed)
   Pre-operative Risk Assessment    Patient Name: Nathaniel [name]  DOB: 09/24/1945 MRN: [id number]   Date of last office visit: 04/19/23 DR Tonny Bollman Date of next office visit: NONE   Request for Surgical Clearance    Procedure:   HERNIA REPAIR  Date of Surgery:  Clearance TBD                                Surgeon:  DR Axel Filler Surgeon's Group or Practice Name:  [facility] Phone number:  [phone] Fax number:  [phone] ATTN: Brennan Bailey, CMA   Type of Clearance Requested:   - Medical  - Pharmacy:  Hold Aspirin and Clopidogrel (Plavix)     Type of Anesthesia:  General    Additional requests/questions:    Signed, Marlow Baars   08/24/2023, 4:32 PM

## 2023-08-24 NOTE — Telephone Encounter (Signed)
 Pt has been scheduled tele preop appt 09/07/23. Med rec and consent are done.

## 2023-08-24 NOTE — Telephone Encounter (Signed)
 Pt has been scheduled tele preop appt 09/07/23. Med rec and consent are done.     Patient Consent for Virtual Visit        Nathaniel [name] has provided verbal consent on 08/24/2023 for a virtual visit (video or telephone).   CONSENT FOR VIRTUAL VISIT FOR:  Nathaniel [name]  By participating in this virtual visit I agree to the following:  I hereby voluntarily request, consent and authorize [facility] and its employed or contracted physicians, physician assistants, nurse practitioners or other licensed health care professionals (the Practitioner), to provide me with telemedicine health care services (the "Services") as deemed necessary by the treating Practitioner. I acknowledge and consent to receive the Services by the Practitioner via telemedicine. I understand that the telemedicine visit will involve communicating with the Practitioner through live audiovisual communication technology and the disclosure of certain medical information by electronic transmission. I acknowledge that I have been given the opportunity to request an in-person assessment or other available alternative prior to the telemedicine visit and am voluntarily participating in the telemedicine visit.  I understand that I have the right to withhold or withdraw my consent to the use of telemedicine in the course of my care at any time, without affecting my right to future care or treatment, and that the Practitioner or I may terminate the telemedicine visit at any time. I understand that I have the right to inspect all information obtained and/or recorded in the course of the telemedicine visit and may receive copies of available information for a reasonable fee.  I understand that some of the potential risks of receiving the Services via telemedicine include:  Delay or interruption in medical evaluation due to technological equipment failure or disruption; Information transmitted may not be sufficient (e.g. poor  resolution of images) to allow for appropriate medical decision making by the Practitioner; and/or  In rare instances, security protocols could fail, causing a breach of personal health information.  Furthermore, I acknowledge that it is my responsibility to provide information about my medical history, conditions and care that is complete and accurate to the best of my ability. I acknowledge that Practitioner's advice, recommendations, and/or decision may be based on factors not within their control, such as incomplete or inaccurate data provided by me or distortions of diagnostic images or specimens that may result from electronic transmissions. I understand that the practice of medicine is not an exact science and that Practitioner makes no warranties or guarantees regarding treatment outcomes. I acknowledge that a copy of this consent can be made available to me via my patient portal [facility] MyChart), or I can request a printed copy by calling the office of [facility].    I understand that my insurance will be billed for this visit.   I have read or had this consent read to me. I understand the contents of this consent, which adequately explains the benefits and risks of the Services being provided via telemedicine.  I have been provided ample opportunity to ask questions regarding this consent and the Services and have had my questions answered to my satisfaction. I give my informed consent for the services to be provided through the use of telemedicine in my medical care

## 2023-09-07 ENCOUNTER — Ambulatory Visit: Attending: Internal Medicine

## 2023-09-07 ENCOUNTER — Telehealth: Payer: Self-pay | Admitting: Cardiovascular Disease

## 2023-09-07 DIAGNOSIS — Z01810 Encounter for preprocedural cardiovascular examination: Secondary | ICD-10-CM

## 2023-09-07 NOTE — Telephone Encounter (Signed)
 Resent pre op clearance via epic  to requesting office.  Will remove from pre op pool.

## 2023-09-07 NOTE — Progress Notes (Signed)
 Virtual Visit via Telephone Note   Because of Nathaniel [name] co-morbid illnesses, he is at least at moderate risk for complications without adequate follow up.  This format is felt to be most appropriate for this patient at this time.  Due to technical limitations with video connection (technology), today's appointment will be conducted as an audio only telehealth visit, and Nathaniel [name] verbally agreed to proceed in this manner.   All issues noted in this document were discussed and addressed.  No physical exam could be performed with this format.  Evaluation Performed:  Preoperative cardiovascular risk assessment _____________   Date:  09/07/2023   Patient ID:  Nathaniel [name], DOB 1946/02/13, MRN [id number] Patient Location:  Home Provider location:   Office  Primary Care Provider:  Thana Ates, MD Primary Cardiologist:  Nathaniel Bollman, MD  Chief Complaint / Patient Profile   78 y.o. y/o male with a h/o coronary artery disease and microvascular angina who is pending hernia repair and presents today for telephonic preoperative cardiovascular risk assessment.  History of Present Illness    Nathaniel [name] is a 78 y.o. male who presents via audio/video conferencing for a telehealth visit today.  Pt was last seen in cardiology clinic on 04/19/2023 by Dr. Excell [name].  At that time Nathaniel [name] was doing well .  The patient is now pending procedure as outlined above. Since his last visit, he tells me that he is "status quo".  He continues to have angina at baseline which is due to his microvascular disease.  Occurs mostly at night and at rest.  When he exerts himself he does not have any angina.  He states that maybe two thirds of the night he experiences this.  This is nothing new and was happening back when he saw Dr. Excell [name] in the fall.  He states that when he had his MI the pain was exertional and now it is not.  There is been a clear change in his angina.  He has not  experienced any other symptoms outside of some palpitations that occur occasionally.  Usually the palpitations are intermittent and resolve on their own.  He gave up coffee soda now they are happening less often.  No shortness of breath.  He does meet the 4 METS minimum on DASI.   His Plavix may be held for 5 days prior to his procedure. His aspirin should be continued throughout the perioperative period. Please resume Plavix as soon as hemostasis is achieved.   Past Medical History    Past Medical History:  Diagnosis Date   Allergic rhinitis    Anemia    pt unaware   Anginal pain (HCC)    Microvascular angina- followed by Dr Nathaniel [name]   Arthritis    back   Cancer [facility])    Basal cell -    Coronary artery disease    nonobstructive   DVT (deep venous thrombosis) (HCC) 03/2011   after prolonged travel   GERD (gastroesophageal reflux disease)    Headache    Vision Migraines   Hiatal hernia    small   History of rheumatic fever    questionable   Hyperlipidemia    Increased prostate specific antigen (PSA) velocity    Laryngopharyngeal reflux    Mitral valve prolapse    Palpitations    PE (pulmonary embolism) 03/2011   after prolonged travel   PONV (postoperative nausea and vomiting)    Fentyl- N/V   Pruritus  resolved   Shortness of breath dyspnea    with Exertion   Sinus bradycardia    Past Surgical History:  Procedure Laterality Date   COLONOSCOPY     COLONOSCOPY N/A 02/24/2015   Procedure: COLONOSCOPY;  Surgeon: [name], MD;  Location: [facility] ENDOSCOPY;  Service: Endoscopy;  Laterality: N/A;   CYSTOSCOPY     ESOPHAGOGASTRODUODENOSCOPY N/A 02/06/2013   Procedure: ESOPHAGOGASTRODUODENOSCOPY (EGD);  Surgeon: Florencia Reasons, MD;  Location: [facility] ENDOSCOPY;  Service: Endoscopy;  Laterality: N/A;   ESOPHAGOGASTRODUODENOSCOPY N/A 02/24/2015   Procedure: ESOPHAGOGASTRODUODENOSCOPY (EGD);  Surgeon: [name], MD;  Location: [facility] ENDOSCOPY;  Service: Endoscopy;  Laterality:  N/A;   HUMERUS FRACTURE SURGERY Right 05/2003   LEFT HEART CATH AND CORONARY ANGIOGRAPHY N/A 02/27/2022   Procedure: LEFT HEART CATH AND CORONARY ANGIOGRAPHY;  Surgeon: Nathaniel Bollman, MD;  Location: [facility] INVASIVE CV LAB;  Service: Cardiovascular;  Laterality: N/A;   Repair of an AC seperation of the left     SHOULDER ARTHROSCOPY Left    A/C tear   TONSILLECTOMY AND ADENOIDECTOMY      Allergies  Allergies  Allergen Reactions   Penicillins Anaphylaxis, Other (See Comments) and Hives    Hands swell, throat swells Hands swell, throat swells No reaction listed Hives (pt clarified at office visit)   Fentanyl Nausea And Vomiting and Other (See Comments)   Imdur [Isosorbide Dinitrate] Other (See Comments)    Migraine headache    Home Medications    Prior to Admission medications   Medication Sig Start Date End Date Taking? Authorizing Provider  amLODipine (NORVASC) 2.5 MG tablet TAKE 1 TABLET(2.5 MG) BY MOUTH DAILY 05/23/23   Nathaniel Bollman, MD  ASPIRIN LOW DOSE 81 MG tablet TAKE 1 TABLET(81 MG) BY MOUTH DAILY. SWALLOW WHOLE 12/15/22   Nathaniel Bollman, MD  azelastine (ASTELIN) 0.1 % nasal spray Place 1-2 sprays into both nostrils 2 (two) times daily as needed for rhinitis or allergies. 01/13/20   [provider]  cholecalciferol (VITAMIN D) 1000 UNITS tablet Take 1,000 Units by mouth every morning.     [provider]  ciclopirox (LOPROX) 0.77 % cream Apply 1 Application topically 2 (two) times daily as needed (rash). 11/03/15   [provider]  clopidogrel (PLAVIX) 75 MG tablet TAKE 1 TABLET(75 MG) BY MOUTH DAILY WITH BREAKFAST 05/23/23   Nathaniel Bollman, MD  Cyanocobalamin (VITAMIN B-12) 2500 MCG SUBL Place 1 tablet under the tongue every Sunday.    [provider]  desoximetasone (TOPICORT) 0.25 % cream Apply 1 Application topically daily as needed (itching on the back). 11/03/15   [provider]  ferrous sulfate 325 (65 FE) MG EC tablet Take 325  mg by mouth every other day.    [provider]  Magnesium 250 MG TABS Take 500 mg by mouth at bedtime.    [provider]  mupirocin cream (BACTROBAN) 2 % Apply 1 Application topically 2 (two) times daily as needed (rash). 06/22/20   [provider]  nitroGLYCERIN (NITROSTAT) 0.4 MG SL tablet Dissolve 1 tablet under the tongue every 5 minutes as needed for chest pain. Max of 3 doses, then 911. 11/29/22   Nahser, Deloris Ping, MD  omega-3 acid ethyl esters (LOVAZA) 1 g capsule Take 1 g by mouth 2 (two) times daily.    [provider]  pantoprazole (PROTONIX) 40 MG tablet Take 1 tablet (40 mg total) by mouth daily at 10 pm. Patient taking differently: Take 40 mg by mouth 2 (two) times daily.  02/28/22   Arty Baumgartner, NP  rosuvastatin (CRESTOR) 20 MG tablet TAKE 1 TABLET(20 MG) BY MOUTH DAILY AT 10 PM 05/25/23   Nathaniel Bollman, MD  senna (SENOKOT) 8.6 MG tablet Take 2 tablets by mouth 2 (two) times daily.    [provider]  tadalafil (CIALIS) 10 MG tablet Take 10 mg by mouth daily as needed for erectile dysfunction.    [provider]  tamsulosin (FLOMAX) 0.4 MG CAPS capsule Take 0.4 mg by mouth at bedtime.     [provider]    Physical Exam    Vital Signs:  Joshuwa Vecchio Malizia does not have vital signs available for review today.  Given telephonic nature of communication, physical exam is limited. AAOx3. NAD. Normal affect.  Speech and respirations are unlabored.  Accessory Clinical Findings    None  Assessment & Plan    1.  Preoperative Cardiovascular Risk Assessment:  Mr. Bergerson's perioperative risk of a major cardiac event is 6.6% according to the Revised Cardiac Risk Index (RCRI).  Therefore, he is at high risk for perioperative complications.   His functional capacity is good at 6.73 METs according to the [facility] Activity Status Index (DASI). Recommendations: According to ACC/AHA guidelines, no further cardiovascular  testing needed.  The patient may proceed to surgery at acceptable risk.   Antiplatelet and/or Anticoagulation Recommendations: Clopidogrel (Plavix) can be held for 5 days prior to his surgery and resumed as soon as possible post op. Continue ASA.  The patient was advised that if he develops new symptoms prior to surgery to contact our office to arrange for a follow-up visit, and he verbalized understanding.   A copy of this note will be routed to requesting surgeon.  Time:   Today, I have spent 20 minutes with the patient with telehealth technology discussing medical history, symptoms, and management plan.     Sharlene Dory, PA-C  09/07/2023, 7:42 AM

## 2023-09-07 NOTE — Telephone Encounter (Signed)
 [facility] with [facility] is following up regarding clearance. She says they received pages 1-7 out of 8. They are just needing to have page 8 faxed as well.  Phone#: [phone] Fax#: [phone]

## 2023-09-17 ENCOUNTER — Other Ambulatory Visit: Payer: Self-pay | Admitting: Cardiovascular Disease

## 2023-09-19 DIAGNOSIS — Z85828 Personal history of other malignant neoplasm of skin: Secondary | ICD-10-CM | POA: Diagnosis not present

## 2023-09-19 DIAGNOSIS — L111 Transient acantholytic dermatosis [Grover]: Secondary | ICD-10-CM | POA: Diagnosis not present

## 2023-09-19 DIAGNOSIS — L820 Inflamed seborrheic keratosis: Secondary | ICD-10-CM | POA: Diagnosis not present

## 2023-09-19 DIAGNOSIS — L905 Scar conditions and fibrosis of skin: Secondary | ICD-10-CM | POA: Diagnosis not present

## 2023-09-19 DIAGNOSIS — L821 Other seborrheic keratosis: Secondary | ICD-10-CM | POA: Diagnosis not present

## 2023-09-20 ENCOUNTER — Telehealth: Payer: Self-pay | Admitting: Cardiovascular Disease

## 2023-09-20 ENCOUNTER — Encounter: Payer: Self-pay | Admitting: Cardiovascular Disease

## 2023-09-20 NOTE — Telephone Encounter (Signed)
 Patient wants a call back from RN Maralyn Sago regarding MyChart message dated 4/10.

## 2023-09-20 NOTE — Telephone Encounter (Signed)
 Spoke with Pt to let him know we received his call and Mychart message and that I would make sure that got to Maralyn Sago and Dr Randolm Idol attention. Pt thanked me.

## 2023-09-21 NOTE — Telephone Encounter (Signed)
Completed in MyChart encounter

## 2023-09-25 NOTE — Progress Notes (Signed)
 Surgery orders requested via Epic inbox.

## 2023-09-26 NOTE — Patient Instructions (Addendum)
 SURGICAL WAITING ROOM VISITATION Patients having surgery or a procedure may have no more than 2 support people in the waiting area - these visitors may rotate.    Children under the age of 43 must have an adult with them who is not the patient.  If the patient needs to stay at the hospital during part of their recovery, the visitor guidelines for inpatient rooms apply. Pre-op nurse will coordinate an appropriate time for 1 support person to accompany patient in pre-op.  This support person may not rotate.    Please refer to the [facility] website for the visitor guidelines for Inpatients (after your surgery is over and you are in a regular room).       Your procedure is scheduled on:  10-02-23   Report to [facility] Main Entrance    Report to admitting at 8:15 AM   Call this number if you have problems the morning of surgery [phone]   Do not eat food :After Midnight.   After Midnight you may have the following liquids until 7:30 AM DAY OF SURGERY  Water Non-Citrus Juices (without pulp, NO RED-Apple, White grape, White cranberry) Black Coffee (NO MILK/CREAM OR CREAMERS, sugar ok)  Clear Tea (NO MILK/CREAM OR CREAMERS, sugar ok) regular and decaf                             Plain Jell-O (NO RED)                                           Fruit ices (not with fruit pulp, NO RED)                                     Popsicles (NO RED)                                                               Sports drinks like Gatorade (NO RED)                   If you have questions, please contact your surgeon's office.   FOLLOW ANY ADDITIONAL PRE OP INSTRUCTIONS YOU RECEIVED FROM YOUR SURGEON'S OFFICE!!!     Oral Hygiene is also important to reduce your risk of infection.                                    Remember - BRUSH YOUR TEETH THE MORNING OF SURGERY WITH YOUR REGULAR TOOTHPASTE   Do NOT smoke after Midnight   Take these medicines the morning of surgery with A SIP OF  WATER:    Amlodipine   Claritin   Pantoprazole   Okay to use nasal spray  Stop all vitamins and herbal supplements 7 days before surgery  Bring CPAP mask and tubing day of surgery.  You may not have any metal on your body including  jewelry, and body piercing             Do not wear  lotions, powders, cologne, or deodorant              Men may shave face and neck.   Do not bring valuables to the hospital. [place] IS NOT RESPONSIBLE   FOR VALUABLES.   Contacts, dentures or bridgework may not be worn into surgery.   DO NOT BRING YOUR HOME MEDICATIONS TO THE HOSPITAL. PHARMACY WILL DISPENSE MEDICATIONS LISTED ON YOUR MEDICATION LIST TO YOU DURING YOUR ADMISSION IN THE HOSPITAL!    Patients discharged on the day of surgery will not be allowed to drive home.  Someone NEEDS to stay with you for the first 24 hours after anesthesia.   Special Instructions: Bring a copy of your healthcare power of attorney and living will documents the day of surgery if you haven't scanned them before.              Please read over the following fact sheets you were given: IF YOU HAVE QUESTIONS ABOUT YOUR PRE-OP INSTRUCTIONS PLEASE CALL [phone] [facility]  If you received a COVID test during your pre-op visit  it is requested that you wear a mask when out in public, stay away from anyone that may not be feeling well and notify your surgeon if you develop symptoms. If you test positive for Covid or have been in contact with anyone that has tested positive in the last 10 days please notify you surgeon.  [place] - Preparing for Surgery Before surgery, you can play an important role.  Because skin is not sterile, your skin needs to be as free of germs as possible.  You can reduce the number of germs on your skin by washing with CHG (chlorahexidine gluconate) soap before surgery.  CHG is an antiseptic cleaner which kills germs and bonds with the skin to continue killing germs even  after washing. Please DO NOT use if you have an allergy to CHG or antibacterial soaps.  If your skin becomes reddened/irritated stop using the CHG and inform your nurse when you arrive at Short Stay. Do not shave (including legs and underarms) for at least 48 hours prior to the first CHG shower.  You may shave your face/neck.  Please follow these instructions carefully:  1.  Shower with CHG Soap the night before surgery and the  morning of surgery.  2.  If you choose to wash your hair, wash your hair first as usual with your normal  shampoo.  3.  After you shampoo, rinse your hair and body thoroughly to remove the shampoo.                             4.  Use CHG as you would any other liquid soap.  You can apply chg directly to the skin and wash.  Gently with a scrungie or clean washcloth.  5.  Apply the CHG Soap to your body ONLY FROM THE NECK DOWN.   Do   not use on face/ open                           Wound or open sores. Avoid contact with eyes, ears mouth and   genitals (private parts).  Wash face,  Genitals (private parts) with your normal soap.             6.  Wash thoroughly, paying special attention to the area where your    surgery  will be performed.  7.  Thoroughly rinse your body with warm water from the neck down.  8.  DO NOT shower/wash with your normal soap after using and rinsing off the CHG Soap.                9.  Pat yourself dry with a clean towel.            10.  Wear clean pajamas.            11.  Place clean sheets on your bed the night of your first shower and do not  sleep with pets. Day of Surgery : Do not apply any lotions/deodorants the morning of surgery.  Please wear clean clothes to the hospital/surgery center.  FAILURE TO FOLLOW THESE INSTRUCTIONS MAY RESULT IN THE CANCELLATION OF YOUR SURGERY  PATIENT SIGNATURE_________________________________  NURSE  SIGNATURE__________________________________  ________________________________________________________________________

## 2023-09-26 NOTE — Progress Notes (Addendum)
 COVID Vaccine Completed:  Yes  Date of COVID positive in last 90 days:  PCP - Tressia Fry, MD Cardiologist - Arnoldo Lapping, MD Neurologist - Dionne Frederick, MD   Cardiac clearance in Epic dated 09-07-23 by Lovette Rud, PA-C  Chest x-ray - CT chest aorta 12-05-22 Epic EKG - 04-19-23 Epic Stress Test - 04-13-20 Epic ECHO - 05-31-23 Epic Cardiac Cath - 04-16-23 Epic Pacemaker/ICD device last checked: Spinal Cord Stimulator: Long Term Monitor - 05-04-23 Epic Cardiac CT 04-11-23 Epic  Bowel Prep -   Sleep Study -  CPAP -   Fasting Blood Sugar -  Checks Blood Sugar _____ times a day  Last dose of GLP1 agonist-  N/A GLP1 instructions:  Hold 7 days before surgery    Last dose of SGLT-2 inhibitors-  N/A SGLT-2 instructions:  Hold 3 days before surgery    Blood Thinner Instructions:  Plavix (hold 5 days)  Last dose:   Time: Aspirin Instructions:  ASA 81 (to continue) Last Dose:  Activity level:  Can go up a flight of stairs and perform activities of daily living without stopping and without symptoms of chest pain or shortness of breath.  Able to exercise without symptoms  Unable to go up a flight of stairs without symptoms of     Anesthesia review:  CAD, hx of MI, dilation ascending aorta  Patient denies shortness of breath, fever, cough and chest pain at PAT appointment  Patient verbalized understanding of instructions that were given to them at the PAT appointment. Patient was also instructed that they will need to review over the PAT instructions again at home before surgery.

## 2023-09-27 ENCOUNTER — Encounter (HOSPITAL_COMMUNITY)
Admission: RE | Admit: 2023-09-27 | Discharge: 2023-09-27 | Disposition: A | Discharge status: Home / Self Care | Source: Ambulatory Visit | Attending: General Surgery | Admitting: General Surgery

## 2023-09-27 ENCOUNTER — Encounter (HOSPITAL_COMMUNITY): Payer: Self-pay

## 2023-09-27 ENCOUNTER — Other Ambulatory Visit: Payer: Self-pay

## 2023-09-27 VITALS — BP 115/62 | HR 42 | Temp 98.5°F | Resp 16 | Ht 66.0 in | Wt 162.0 lb

## 2023-09-27 DIAGNOSIS — Z01812 Encounter for preprocedural laboratory examination: Secondary | ICD-10-CM | POA: Diagnosis not present

## 2023-09-27 DIAGNOSIS — Z86711 Personal history of pulmonary embolism: Secondary | ICD-10-CM | POA: Insufficient documentation

## 2023-09-27 DIAGNOSIS — Z01810 Encounter for preprocedural cardiovascular examination: Secondary | ICD-10-CM | POA: Diagnosis present

## 2023-09-27 DIAGNOSIS — Z955 Presence of coronary angioplasty implant and graft: Secondary | ICD-10-CM | POA: Diagnosis not present

## 2023-09-27 DIAGNOSIS — K449 Diaphragmatic hernia without obstruction or gangrene: Secondary | ICD-10-CM | POA: Diagnosis not present

## 2023-09-27 DIAGNOSIS — K4020 Bilateral inguinal hernia, without obstruction or gangrene, not specified as recurrent: Secondary | ICD-10-CM | POA: Diagnosis not present

## 2023-09-27 DIAGNOSIS — N189 Chronic kidney disease, unspecified: Secondary | ICD-10-CM | POA: Insufficient documentation

## 2023-09-27 DIAGNOSIS — K219 Gastro-esophageal reflux disease without esophagitis: Secondary | ICD-10-CM | POA: Insufficient documentation

## 2023-09-27 DIAGNOSIS — I129 Hypertensive chronic kidney disease with stage 1 through stage 4 chronic kidney disease, or unspecified chronic kidney disease: Secondary | ICD-10-CM | POA: Insufficient documentation

## 2023-09-27 DIAGNOSIS — D649 Anemia, unspecified: Secondary | ICD-10-CM

## 2023-09-27 DIAGNOSIS — R0609 Other forms of dyspnea: Secondary | ICD-10-CM | POA: Diagnosis not present

## 2023-09-27 DIAGNOSIS — Z86718 Personal history of other venous thrombosis and embolism: Secondary | ICD-10-CM | POA: Diagnosis not present

## 2023-09-27 DIAGNOSIS — I252 Old myocardial infarction: Secondary | ICD-10-CM | POA: Insufficient documentation

## 2023-09-27 DIAGNOSIS — I2510 Atherosclerotic heart disease of native coronary artery without angina pectoris: Secondary | ICD-10-CM | POA: Insufficient documentation

## 2023-09-27 DIAGNOSIS — R001 Bradycardia, unspecified: Secondary | ICD-10-CM | POA: Diagnosis not present

## 2023-09-27 DIAGNOSIS — D631 Anemia in chronic kidney disease: Secondary | ICD-10-CM | POA: Diagnosis not present

## 2023-09-27 DIAGNOSIS — R9431 Abnormal electrocardiogram [ECG] [EKG]: Secondary | ICD-10-CM | POA: Insufficient documentation

## 2023-09-27 HISTORY — DX: Essential (primary) hypertension: I10

## 2023-09-27 LAB — BASIC METABOLIC PANEL WITH GFR
Anion gap: 8 (ref 5–15)
BUN: 20 mg/dL (ref 8–23)
CO2: 24 mmol/L (ref 22–32)
Calcium: 9 mg/dL (ref 8.9–10.3)
Chloride: 105 mmol/L (ref 98–111)
Creatinine, Ser: 1.34 mg/dL — ABNORMAL HIGH (ref 0.61–1.24)
GFR, Estimated: 54 mL/min — ABNORMAL LOW (ref >60)
Glucose, Bld: 95 mg/dL (ref 70–99)
Potassium: 4 mmol/L (ref 3.5–5.1)
Sodium: 137 mmol/L (ref 135–145)

## 2023-09-27 LAB — CBC
HCT: 39.4 % (ref 39.0–52.0)
Hemoglobin: 12.5 g/dL — ABNORMAL LOW (ref 13.0–17.0)
MCH: 29.8 pg (ref 26.0–34.0)
MCHC: 31.7 g/dL (ref 30.0–36.0)
MCV: 94 fL (ref 80.0–100.0)
Platelets: 226 10*3/uL (ref 150–400)
RBC: 4.19 MIL/uL — ABNORMAL LOW (ref 4.22–5.81)
RDW: 13.8 % (ref 11.5–15.5)
WBC: 5.1 10*3/uL (ref 4.0–10.5)
nRBC: 0 % (ref 0.0–0.2)

## 2023-10-01 ENCOUNTER — Encounter (HOSPITAL_COMMUNITY): Payer: Self-pay

## 2023-10-01 ENCOUNTER — Ambulatory Visit: Payer: Self-pay | Admitting: General Surgery

## 2023-10-01 NOTE — Anesthesia Preprocedure Evaluation (Addendum)
 Anesthesia Evaluation  Patient identified by MRN, date of birth, ID band Patient awake    Reviewed: Allergy & Precautions, NPO status , Patient's Chart, lab work & pertinent test results  History of Anesthesia Complications (+) PONV and history of anesthetic complications  Airway Mallampati: II  TM Distance: >3 FB Neck ROM: Limited    Dental no notable dental hx.    Pulmonary PE (2012, provoked)   Pulmonary exam normal        Cardiovascular hypertension, Pt. on medications + CAD and + Past MI  Normal cardiovascular exam     Neuro/Psych  Headaches    GI/Hepatic Neg liver ROS, hiatal hernia,GERD  Medicated,,  Endo/Other  negative endocrine ROS    Renal/GU Renal InsufficiencyRenal disease (Cr 1.34)     Musculoskeletal  (+) Arthritis ,    Abdominal   Peds  Hematology  (+) Blood dyscrasia (Hgb 12.5), anemia   Anesthesia Other Findings Day of surgery medications reviewed with patient.  Reproductive/Obstetrics                              Anesthesia Physical Anesthesia Plan  ASA: 3  Anesthesia Plan: General   Post-op Pain Management: Tylenol  PO (pre-op)*   Induction: Intravenous  PONV Risk Score and Plan: 3 and Ondansetron , Dexamethasone , Treatment may vary due to age or medical condition and Propofol  infusion  Airway Management Planned: Oral ETT and Video Laryngoscope Planned  Additional Equipment: None  Intra-op Plan:   Post-operative Plan: Extubation in OR  Informed Consent: I have reviewed the patients History and Physical, chart, labs and discussed the procedure including the risks, benefits and alternatives for the proposed anesthesia with the patient or authorized representative who has indicated his/her understanding and acceptance.     Dental advisory given  Plan Discussed with: CRNA  Anesthesia Plan Comments: (See note from 4/17)         Anesthesia Quick  Evaluation

## 2023-10-01 NOTE — Progress Notes (Signed)
 Case: [id number] Date/Time: 10/02/23 1015   Procedure: REPAIR, HERNIA, INGUINAL, BILATERAL, LAPAROSCOPIC (Bilateral) - LAPAROSCOPIC BILATERAL INGUINAL HERNIA REPAIR WITH MESH   Anesthesia type: General   Diagnosis: Bilateral inguinal hernia without obstruction or gangrene, recurrence not specified [K40.20]   Pre-op diagnosis: BILATERAL INGUINAL HERNIA   Location: [facility] ROOM 02 / [facility]   Surgeons: Shela Derby, MD       DISCUSSION: Nathaniel [name] is a 78 yo male who presents to PAT prior to surgery above. PMH of HTN, hx of NSTEMI (2023, and Aug 2024), CAD s/p PCI in 2023, bradycardia, DOE, hx of DVT/PE (provoked), GERD, hiatal hernia, CKD, anemia.  Prior anesthesia complications include PONV. Does not want fentanyl , reports nausea and vomiting with fentanyl  in the past. Tolerated versed  in the past, propofol    Patient follows with Cardiology for above hx. Last seen by Cardiology on 04/19/23. He reported fatigue and occasional palpitations but otherwise no cardiac symptoms with good functional status. Last cath in August 2024 after 2nd NSTEMI showed stable coronary findings with nonobstructive CAD. Cardiac PET scan done in Oct 2024 "demonstrated a small, mild reversible defect in the mid to apical anterior wall consistent with ischemia, normal LVEF response to stress, no transient ischemic dilatation and no evidence of MI.  His resting LVEF was calculated at 44% and stress LVEF was 50%.  Overall the study was interpreted as "low risk."" Echo was recommended due to possible depressed EF and echo done on 05/31/23 showed normal LVEF 60-65%, grade I DD, mild AI. Heart monitor also done for palpitations which showed marked sinus bradycardia with no high grade block.  He was evaluated by Cardiology via tele visit on 09/07/23 for pre op clearance. Cleared for surgery: "Nathaniel [name]'s perioperative risk of a major cardiac event is 6.6% according to the Revised Cardiac Risk Index (RCRI).  Therefore, he is at  high risk for perioperative complications.   His functional capacity is good at 6.73 METs according to the Duke Activity Status Index (DASI). Recommendations: According to ACC/AHA guidelines, no further cardiovascular testing needed.  The patient may proceed to surgery at acceptable risk.   Antiplatelet and/or Anticoagulation Recommendations: Clopidogrel  (Plavix ) can be held for 5 days prior to his surgery and resumed as soon as possible post op. Continue ASA."   There was some concern over risk calculator saying "high risk" however per Dr. Arlester Ladd: "You have had extensive evaluation including a low-risk cardiac PET stress test, a coronary angiogram with no high grade obstruction, and you have been well compensated with recent echo showing normal LVEF. You are not at high risk of cardiac complications from inguinal hernia surgery and can proceed without further testing "  LD Plavix : 4/16   VS: BP 115/62   Pulse (!) 42   Temp 36.9 C (Oral)   Resp 16   Ht 5\' 6"  (1.676 m)   Wt 73.5 kg   SpO2 98%   BMI 26.15 kg/m   PROVIDERS: Tena Feeling, MD   LABS: Labs reviewed: Acceptable for surgery. (all labs ordered are listed, but only abnormal results are displayed)  Labs Reviewed  BASIC METABOLIC PANEL WITH GFR - Abnormal; Notable for the following components:      Result Value   Creatinine, Ser 1.34 (*)    GFR, Estimated 54 (*)    All other components within normal limits  CBC - Abnormal; Notable for the following components:   RBC 4.19 (*)    Hemoglobin 12.5 (*)    All  other components within normal limits     IMAGES: CTA chest 12/05/22:  IMPRESSION: No acute finding on the CT angiogram   Coronary artery disease and atherosclerosis of the aorta. Aortic Atherosclerosis (ICD10-I70.0).   Negative for aortic aneurysm  EKG 09/27/23:  Marked sinus bradycardia with sinus arrhythmia with 1st degree A-V block Non-specific intra-ventricular conduction delay Minimal voltage  criteria for LVH, may be normal variant Septal infarct , age undetermined ST & T wave abnormality, consider lateral ischemia Abnormal ECG When compared with ECG of 19-Apr-2023 11:05, New lateral ST/T wave changes  CV:  Echo 05/31/23:  IMPRESSIONS     1. Left ventricular ejection fraction, by estimation, is 60 to 65%. The  left ventricle has normal function. The left ventricle has no regional  wall motion abnormalities. There is mild concentric left ventricular  hypertrophy. Left ventricular diastolic  parameters are consistent with Grade I diastolic dysfunction (impaired  relaxation).   2. Right ventricular systolic function is normal. The right ventricular  size is normal.   3. Left atrial size was mildly dilated.   4. Right atrial size was moderately dilated.   5. The mitral valve is normal in structure. Trivial mitral valve  regurgitation. No evidence of mitral stenosis.   6. The aortic valve is tricuspid. There is mild calcification of the  aortic valve. Aortic valve regurgitation is mild. Aortic valve  sclerosis/calcification is present, without any evidence of aortic  stenosis. Aortic regurgitation PHT measures 754 msec.   7. Aortic dilatation noted. There is borderline dilatation of the  ascending aorta, measuring 38 mm.   8. The inferior vena cava is normal in size with <50% respiratory  variability, suggesting right atrial pressure of 8 mmHg.   Heart monitor 05/04/23:  Summary: The basic rhythm is normal sinus with predominantly sinus bradycardia.  The average heart rate is 44 bpm.  There are no pathologic pauses greater than 3 seconds.  There is no high-grade AV block present.  There are occasional PVCs occurring at a burden of less than 1%.  Otherwise as above.  No sustained arrhythmias.   Cardiac PET scan 04/11/23:  Narrative & Impression     Small, mild, reversible defect in the mid to apical anterior segments consistent with ischemia. Normal LVEF response to  stress. No TID. MBFR is abnormal (1.45). If there are no concerns for obstructive 3-vessel CAD, the reduced MBFR represents microvascular dysfunction. Low-risk study by perfusion alone.   LV perfusion is abnormal. There is evidence of ischemia. There is no evidence of infarction. Defect 1: There is a small defect with mild reduction in uptake present in the apical to mid anterior location(s) that is reversible. There is normal wall motion in the defect area. Consistent with ischemia. The defect is consistent with abnormal perfusion in the LAD territory.   Rest left ventricular function is abnormal. Rest global function is mildly reduced. There were no regional wall motion abnormalities. Rest EF: 44%. Stress left ventricular function is normal. Stress EF: 50%. End diastolic cavity size is normal.   Myocardial blood flow was computed to be 0.73ml/g/min at rest and 1.05ml/g/min at stress. Global myocardial blood flow reserve was 1.46 and was abnormal.   Coronary calcium  assessment not performed due to prior revascularization.   Findings are consistent with ischemia. The study is low risk.  CTA coronary 08/23/21:    IMPRESSION: 1. Coronary calcium  score of 149. This was 38th percentile for age-, sex, and race-matched controls.   2.  Normal coronary  origin with right dominance.   3.  Mild atherosclerosis.  CAD RADS 2.   4.  Recommend preventive therapy and risk factor modification.   5.  Consider non atherosclerotic causes of chest pain.   Past Medical History:  Diagnosis Date   Allergic rhinitis    Anemia    pt unaware   Anginal pain (HCC)    Microvascular angina- followed by Dr Arlester Ladd   Arthritis    back   Cancer [facility])    Basal cell -    Coronary artery disease    nonobstructive   DVT (deep venous thrombosis) (HCC) 03/13/2011   after prolonged travel   GERD (gastroesophageal reflux disease)    Headache    Vision Migraines   Hiatal hernia    small   History of rheumatic fever     questionable   Hyperlipidemia    Hypertension    Increased prostate specific antigen (PSA) velocity    Laryngopharyngeal reflux    Mitral valve prolapse    Palpitations    PE (pulmonary embolism) 03/13/2011   after prolonged travel   PONV (postoperative nausea and vomiting)    Fentyl- N/V   Pruritus    resolved   Shortness of breath dyspnea    with Exertion   Sinus bradycardia     Past Surgical History:  Procedure Laterality Date   COLONOSCOPY     COLONOSCOPY N/A 02/24/2015   Procedure: COLONOSCOPY;  Surgeon: Lanita Pitman, MD;  Location: [facility] ENDOSCOPY;  Service: Endoscopy;  Laterality: N/A;   CYSTOSCOPY     ESOPHAGOGASTRODUODENOSCOPY N/A 02/06/2013   Procedure: ESOPHAGOGASTRODUODENOSCOPY (EGD);  Surgeon: Brice Campi, MD;  Location: [facility] ENDOSCOPY;  Service: Endoscopy;  Laterality: N/A;   ESOPHAGOGASTRODUODENOSCOPY N/A 02/24/2015   Procedure: ESOPHAGOGASTRODUODENOSCOPY (EGD);  Surgeon: Lanita Pitman, MD;  Location: [facility] ENDOSCOPY;  Service: Endoscopy;  Laterality: N/A;   HUMERUS FRACTURE SURGERY Right 05/2003   LEFT HEART CATH AND CORONARY ANGIOGRAPHY N/A 02/27/2022   Procedure: LEFT HEART CATH AND CORONARY ANGIOGRAPHY;  Surgeon: Arnoldo Lapping, MD;  Location: [facility] INVASIVE CV LAB;  Service: Cardiovascular;  Laterality: N/A;   Repair of an AC seperation of the left     SHOULDER ARTHROSCOPY Left    A/C tear   TONSILLECTOMY AND ADENOIDECTOMY      MEDICATIONS:  amLODipine  (NORVASC ) 2.5 MG tablet   aspirin  EC (ASPIRIN  LOW DOSE) 81 MG tablet   azelastine (ASTELIN) 0.1 % nasal spray   chlorpheniramine (CHLOR-TRIMETON) 4 MG tablet   cholecalciferol  (VITAMIN D ) 1000 UNITS tablet   ciclopirox (LOPROX) 0.77 % cream   clobetasol cream (TEMOVATE) 0.05 %   clopidogrel  (PLAVIX ) 75 MG tablet   Cyanocobalamin  (VITAMIN B-12) 2500 MCG SUBL   desoximetasone (TOPICORT) 0.25 % cream   ferrous sulfate 325 (65 FE) MG EC tablet   loratadine (CLARITIN) 10 MG tablet   Magnesium  250 MG TABS    mupirocin cream (BACTROBAN) 2 %   nitroGLYCERIN  (NITROSTAT ) 0.4 MG SL tablet   omega-3 acid ethyl esters (LOVAZA) 1 g capsule   pantoprazole  (PROTONIX ) 40 MG tablet   rosuvastatin  (CRESTOR ) 20 MG tablet   senna (SENOKOT) 8.6 MG tablet   sildenafil (VIAGRA) 100 MG tablet   sodium chloride  (OCEAN) 0.65 % SOLN nasal spray   tamsulosin  (FLOMAX ) 0.4 MG CAPS capsule   No current facility-administered medications for this encounter.   Antoinette Kirschner [facility] Surgical Short Stay/Anesthesiology [facility] Phone [phone] 10/01/2023 9:02 AM

## 2023-10-01 NOTE — H&P (View-Only) (Signed)
 History of Present Illness: Nathaniel [name] is a 78 y.o. male who is seen today as an office consultation at the request of Dr. Candi Chafe for evaluation of New Patient (Inguinal hernia ) .     History of Present Illness The patient, with a past medical history significant for myocardial infarction managed with Plavix  and aspirin , presents for evaluation of a right-sided hernia. The hernia was first noticed several years ago and was not initially bothersome. However, over the past six months, the hernia has increased in size and has become uncomfortable. The patient reports a sensation of a bulge that often "pops out," leading to constant manual reduction. The patient denies any episodes of incarceration but describes the discomfort as annoying.   In addition to the right-sided hernia, the patient mentions a left-sided hernia identified on a CT scan over ten years ago. This hernia has remained asymptomatic and has not noticeably changed in size. The patient is unsure whether this left-sided hernia requires surgical intervention.   The patient is an active golfer and is concerned about postoperative restrictions and recovery time. The patient also mentions a slow heart rate, which is a known baseline for him, and expresses concern about potential intraoperative bradycardia.     Review of Systems: A complete review of systems was obtained from the patient.  I have reviewed this information and discussed as appropriate with the patient.  See HPI as well for other ROS.   Review of Systems  Constitutional: Negative.   HENT: Negative.    Eyes: Negative.   Respiratory: Negative.    Cardiovascular: Negative.   Gastrointestinal: Negative.   Genitourinary: Negative.   Musculoskeletal: Negative.   Skin: Negative.   Neurological: Negative.   Endo/Heme/Allergies: Negative.   Psychiatric/Behavioral: Negative.          Medical History: Past Medical History Past Medical  History: Diagnosis Date  Anemia    Arrhythmia    Arthritis    DVT (deep venous thrombosis) (CMS/HHS-HCC)    GERD (gastroesophageal reflux disease)    Hyperlipidemia    Hypertension    Pulmonary hypertension (CMS/HHS-HCC)         Problem List There is no problem list on file for this patient.     Past Surgical History History reviewed. No pertinent surgical history.     Allergies Allergies Allergen Reactions  Fentanyl  Nausea  Penicillin Rash      Medications Ordered Prior to Encounter Current Outpatient Medications on File Prior to Visit Medication Sig Dispense Refill  amLODIPine  (NORVASC ) 2.5 MG tablet Take 2.5 mg by mouth once daily      aspirin  81 MG EC tablet Take 81 mg by mouth once daily      azelastine (ASTELIN) 137 mcg nasal spray Place 1 spray into both nostrils 2 (two) times daily      cholecalciferol  (VITAMIN D3) 1000 unit capsule Take 1,000 Units by mouth once daily      clopidogreL  (PLAVIX ) 75 mg tablet Take 75 mg by mouth once daily      cyanocobalamin  (VITAMIN B12) 1000 MCG tablet Take 1,000 mcg by mouth once daily      ferrous sulfate 325 (65 FE) MG tablet Take 325 mg by mouth daily with breakfast      nitroGLYcerin  (NITROSTAT ) 0.4 MG SL tablet Place 0.4 mg under the tongue every 5 (five) minutes as needed for Chest pain May take up to 3 doses.      pantoprazole  (PROTONIX ) 40 MG DR tablet Take 40 mg  by mouth once daily      sennosides (SENOKOT) 8.6 mg tablet Take 1 tablet by mouth once daily        No current facility-administered medications on file prior to visit.      Family History Family History Problem Relation Age of Onset  Hyperlipidemia (Elevated cholesterol) Mother    Coronary Artery Disease (Blocked arteries around heart) Father        Tobacco Use History Social History    Tobacco Use Smoking Status Never Smokeless Tobacco Never      Social History Social History    Socioeconomic History  Marital  status: Married Tobacco Use  Smoking status: Never  Smokeless tobacco: Never Substance and Sexual Activity  Alcohol use: Yes     Comment: rarely  Drug use: Never    Social Drivers of Health    Food Insecurity: Low Risk  (07/30/2023)   Received from [facility]   Hunger Vital Sign    Worried About Running Out of Food in the Last Year: Never true    Ran Out of Food in the Last Year: Never true Transportation Needs: No Transportation Needs (07/30/2023)   Received from [employer]    In the past 12 months, has lack of reliable transportation kept you from medical appointments, meetings, work or from getting things needed for daily living? : No Housing Stability: Unknown (08/24/2023)   Housing Stability Vital Sign    Homeless in the Last Year: No      Objective:     Vitals:   08/24/23 1135 BP: 116/78 Pulse: (!) 40 Temp: 36.8 C (98.2 F) SpO2: 96% Weight: 73.5 kg (162 lb) Height: 167.6 cm (5\' 6" ) PainSc: 0-No pain   Body mass index is 26.15 kg/m. Physical Exam Constitutional:      Appearance: Normal appearance.  HENT:     Head: Normocephalic and atraumatic.     Nose: Nose normal. No congestion.     Mouth/Throat:     Mouth: Mucous membranes are moist.     Pharynx: Oropharynx is clear.  Eyes:     Pupils: Pupils are equal, round, and reactive to light.  Cardiovascular:     Rate and Rhythm: Normal rate and regular rhythm.     Pulses: Normal pulses.     Heart sounds: Normal heart sounds. No murmur heard.    No friction rub. No gallop.  Pulmonary:     Effort: Pulmonary effort is normal. No respiratory distress.     Breath sounds: Normal breath sounds. No stridor. No wheezing, rhonchi or rales.  Abdominal:     General: Abdomen is flat.     Hernia: A hernia is present. Hernia is present in the left inguinal area and right inguinal area.  Musculoskeletal:        General: Normal range of motion.     Cervical back: Normal range of motion.  Skin:     General: Skin is warm and dry.  Neurological:     General: No focal deficit present.     Mental Status: He is alert and oriented to person, place, and time.  Psychiatric:        Mood and Affect: Mood normal.        Thought Content: Thought content normal.          Assessment and Plan: Diagnoses and all orders for this visit:   Bilateral inguinal hernia without obstruction or gangrene, recurrence not specified     Tonya Fredrickson Scafidi  is a 78 y.o. male    1.  We will proceed to the OR for a lap bilateral inguinal hernia repair with mesh, R>L. 2. All risks and benefits were discussed with the patient, to generally include infection, bleeding, damage to surrounding structures, acute and chronic nerve pain, and recurrence. Alternatives were offered and described.  All questions were answered and the patient voiced understanding of the procedure and wishes to proceed at this point.             No follow-ups on file.   Shela Derby, MD, [facility], [place] [facility]

## 2023-10-01 NOTE — H&P (Signed)
 History of Present Illness: Nathaniel [name] is a 78 y.o. male who is seen today as an office consultation at the request of Dr. Candi Chafe for evaluation of New Patient (Inguinal hernia ) .     History of Present Illness The patient, with a past medical history significant for myocardial infarction managed with Plavix  and aspirin , presents for evaluation of a right-sided hernia. The hernia was first noticed several years ago and was not initially bothersome. However, over the past six months, the hernia has increased in size and has become uncomfortable. The patient reports a sensation of a bulge that often "pops out," leading to constant manual reduction. The patient denies any episodes of incarceration but describes the discomfort as annoying.   In addition to the right-sided hernia, the patient mentions a left-sided hernia identified on a CT scan over ten years ago. This hernia has remained asymptomatic and has not noticeably changed in size. The patient is unsure whether this left-sided hernia requires surgical intervention.   The patient is an active golfer and is concerned about postoperative restrictions and recovery time. The patient also mentions a slow heart rate, which is a known baseline for him, and expresses concern about potential intraoperative bradycardia.     Review of Systems: A complete review of systems was obtained from the patient.  I have reviewed this information and discussed as appropriate with the patient.  See HPI as well for other ROS.   Review of Systems  Constitutional: Negative.   HENT: Negative.    Eyes: Negative.   Respiratory: Negative.    Cardiovascular: Negative.   Gastrointestinal: Negative.   Genitourinary: Negative.   Musculoskeletal: Negative.   Skin: Negative.   Neurological: Negative.   Endo/Heme/Allergies: Negative.   Psychiatric/Behavioral: Negative.          Medical History: Past Medical History Past Medical  History: Diagnosis Date  Anemia    Arrhythmia    Arthritis    DVT (deep venous thrombosis) (CMS/HHS-HCC)    GERD (gastroesophageal reflux disease)    Hyperlipidemia    Hypertension    Pulmonary hypertension (CMS/HHS-HCC)         Problem List There is no problem list on file for this patient.     Past Surgical History History reviewed. No pertinent surgical history.     Allergies Allergies Allergen Reactions  Fentanyl  Nausea  Penicillin Rash      Medications Ordered Prior to Encounter Current Outpatient Medications on File Prior to Visit Medication Sig Dispense Refill  amLODIPine  (NORVASC ) 2.5 MG tablet Take 2.5 mg by mouth once daily      aspirin  81 MG EC tablet Take 81 mg by mouth once daily      azelastine (ASTELIN) 137 mcg nasal spray Place 1 spray into both nostrils 2 (two) times daily      cholecalciferol  (VITAMIN D3) 1000 unit capsule Take 1,000 Units by mouth once daily      clopidogreL  (PLAVIX ) 75 mg tablet Take 75 mg by mouth once daily      cyanocobalamin  (VITAMIN B12) 1000 MCG tablet Take 1,000 mcg by mouth once daily      ferrous sulfate 325 (65 FE) MG tablet Take 325 mg by mouth daily with breakfast      nitroGLYcerin  (NITROSTAT ) 0.4 MG SL tablet Place 0.4 mg under the tongue every 5 (five) minutes as needed for Chest pain May take up to 3 doses.      pantoprazole  (PROTONIX ) 40 MG DR tablet Take 40 mg  by mouth once daily      sennosides (SENOKOT) 8.6 mg tablet Take 1 tablet by mouth once daily        No current facility-administered medications on file prior to visit.      Family History Family History Problem Relation Age of Onset  Hyperlipidemia (Elevated cholesterol) Mother    Coronary Artery Disease (Blocked arteries around heart) Father        Tobacco Use History Social History    Tobacco Use Smoking Status Never Smokeless Tobacco Never      Social History Social History    Socioeconomic History  Marital  status: Married Tobacco Use  Smoking status: Never  Smokeless tobacco: Never Substance and Sexual Activity  Alcohol use: Yes     Comment: rarely  Drug use: Never    Social Drivers of Health    Food Insecurity: Low Risk  (07/30/2023)   Received from [facility]   Hunger Vital Sign    Worried About Running Out of Food in the Last Year: Never true    Ran Out of Food in the Last Year: Never true Transportation Needs: No Transportation Needs (07/30/2023)   Received from [employer]    In the past 12 months, has lack of reliable transportation kept you from medical appointments, meetings, work or from getting things needed for daily living? : No Housing Stability: Unknown (08/24/2023)   Housing Stability Vital Sign    Homeless in the Last Year: No      Objective:     Vitals:   08/24/23 1135 BP: 116/78 Pulse: (!) 40 Temp: 36.8 C (98.2 F) SpO2: 96% Weight: 73.5 kg (162 lb) Height: 167.6 cm (5\' 6" ) PainSc: 0-No pain   Body mass index is 26.15 kg/m. Physical Exam Constitutional:      Appearance: Normal appearance.  HENT:     Head: Normocephalic and atraumatic.     Nose: Nose normal. No congestion.     Mouth/Throat:     Mouth: Mucous membranes are moist.     Pharynx: Oropharynx is clear.  Eyes:     Pupils: Pupils are equal, round, and reactive to light.  Cardiovascular:     Rate and Rhythm: Normal rate and regular rhythm.     Pulses: Normal pulses.     Heart sounds: Normal heart sounds. No murmur heard.    No friction rub. No gallop.  Pulmonary:     Effort: Pulmonary effort is normal. No respiratory distress.     Breath sounds: Normal breath sounds. No stridor. No wheezing, rhonchi or rales.  Abdominal:     General: Abdomen is flat.     Hernia: A hernia is present. Hernia is present in the left inguinal area and right inguinal area.  Musculoskeletal:        General: Normal range of motion.     Cervical back: Normal range of motion.  Skin:     General: Skin is warm and dry.  Neurological:     General: No focal deficit present.     Mental Status: He is alert and oriented to person, place, and time.  Psychiatric:        Mood and Affect: Mood normal.        Thought Content: Thought content normal.          Assessment and Plan: Diagnoses and all orders for this visit:   Bilateral inguinal hernia without obstruction or gangrene, recurrence not specified     Nathaniel [name]  is a 78 y.o. male    1.  We will proceed to the OR for a lap bilateral inguinal hernia repair with mesh, R>L. 2. All risks and benefits were discussed with the patient, to generally include infection, bleeding, damage to surrounding structures, acute and chronic nerve pain, and recurrence. Alternatives were offered and described.  All questions were answered and the patient voiced understanding of the procedure and wishes to proceed at this point.             No follow-ups on file.   Shela Derby, MD, [facility], [place] [facility]

## 2023-10-02 ENCOUNTER — Other Ambulatory Visit (HOSPITAL_COMMUNITY): Payer: Self-pay

## 2023-10-02 ENCOUNTER — Ambulatory Visit (HOSPITAL_COMMUNITY): Admitting: Physician Assistant

## 2023-10-02 ENCOUNTER — Encounter (HOSPITAL_COMMUNITY): Payer: Self-pay | Admitting: General Surgery

## 2023-10-02 ENCOUNTER — Ambulatory Visit (HOSPITAL_COMMUNITY)
Admission: RE | Admit: 2023-10-02 | Discharge: 2023-10-02 | Disposition: A | Discharge status: Home / Self Care | Attending: General Surgery | Admitting: General Surgery

## 2023-10-02 ENCOUNTER — Other Ambulatory Visit: Payer: Self-pay

## 2023-10-02 ENCOUNTER — Encounter (HOSPITAL_COMMUNITY)
Admission: RE | Disposition: A | Discharge status: Home / Self Care | Payer: Self-pay | Source: Home / Self Care | Attending: General Surgery

## 2023-10-02 ENCOUNTER — Ambulatory Visit (HOSPITAL_BASED_OUTPATIENT_CLINIC_OR_DEPARTMENT_OTHER): Admitting: Anesthesiology

## 2023-10-02 DIAGNOSIS — Z8249 Family history of ischemic heart disease and other diseases of the circulatory system: Secondary | ICD-10-CM | POA: Insufficient documentation

## 2023-10-02 DIAGNOSIS — I10 Essential (primary) hypertension: Secondary | ICD-10-CM

## 2023-10-02 DIAGNOSIS — Z79899 Other long term (current) drug therapy: Secondary | ICD-10-CM | POA: Insufficient documentation

## 2023-10-02 DIAGNOSIS — M1990 Unspecified osteoarthritis, unspecified site: Secondary | ICD-10-CM | POA: Diagnosis not present

## 2023-10-02 DIAGNOSIS — I2510 Atherosclerotic heart disease of native coronary artery without angina pectoris: Secondary | ICD-10-CM

## 2023-10-02 DIAGNOSIS — Z7902 Long term (current) use of antithrombotics/antiplatelets: Secondary | ICD-10-CM | POA: Diagnosis not present

## 2023-10-02 DIAGNOSIS — D176 Benign lipomatous neoplasm of spermatic cord: Secondary | ICD-10-CM | POA: Insufficient documentation

## 2023-10-02 DIAGNOSIS — K4020 Bilateral inguinal hernia, without obstruction or gangrene, not specified as recurrent: Secondary | ICD-10-CM

## 2023-10-02 DIAGNOSIS — I252 Old myocardial infarction: Secondary | ICD-10-CM | POA: Insufficient documentation

## 2023-10-02 DIAGNOSIS — K219 Gastro-esophageal reflux disease without esophagitis: Secondary | ICD-10-CM | POA: Insufficient documentation

## 2023-10-02 DIAGNOSIS — K449 Diaphragmatic hernia without obstruction or gangrene: Secondary | ICD-10-CM | POA: Diagnosis not present

## 2023-10-02 HISTORY — PX: INGUINAL HERNIA REPAIR: SHX194

## 2023-10-02 SURGERY — REPAIR, HERNIA, INGUINAL, BILATERAL, LAPAROSCOPIC
Anesthesia: General | Laterality: Bilateral

## 2023-10-02 MED ORDER — 0.9 % SODIUM CHLORIDE (POUR BTL) OPTIME
TOPICAL | Status: DC | PRN
  Administered 2023-10-02: 1000 mL

## 2023-10-02 MED ORDER — SUGAMMADEX SODIUM 200 MG/2ML IV SOLN
INTRAVENOUS | Status: DC | PRN
  Administered 2023-10-02: 200 mg via INTRAVENOUS

## 2023-10-02 MED ORDER — LIDOCAINE HCL (PF) 2 % IJ SOLN
INTRAMUSCULAR | Status: AC
  Filled 2023-10-02: qty 5

## 2023-10-02 MED ORDER — TRAMADOL HCL 50 MG PO TABS
50.0000 mg | ORAL_TABLET | Freq: Four times a day (QID) | ORAL | 0 refills | Status: AC | PRN
  Filled 2023-10-02: qty 20, 5d supply, fill #0

## 2023-10-02 MED ORDER — CHLORHEXIDINE GLUCONATE CLOTH 2 % EX PADS: 6.0000 | MEDICATED_PAD | Freq: Once | CUTANEOUS | Status: DC

## 2023-10-02 MED ORDER — VANCOMYCIN HCL IN DEXTROSE 1-5 GM/200ML-% IV SOLN
1000.0000 mg | INTRAVENOUS | Status: AC
  Administered 2023-10-02: 1000 mg via INTRAVENOUS
  Filled 2023-10-02: qty 200

## 2023-10-02 MED ORDER — HYDROMORPHONE HCL 1 MG/ML IJ SOLN
INTRAMUSCULAR | Status: DC | PRN
  Administered 2023-10-02: .4 mg via INTRAVENOUS
  Administered 2023-10-02: .2 mg via INTRAVENOUS

## 2023-10-02 MED ORDER — PROPOFOL 10 MG/ML IV BOLUS
INTRAVENOUS | Status: DC | PRN
  Administered 2023-10-02: 120 mg via INTRAVENOUS

## 2023-10-02 MED ORDER — EPHEDRINE SULFATE-NACL 50-0.9 MG/10ML-% IV SOSY
PREFILLED_SYRINGE | INTRAVENOUS | Status: DC | PRN
  Administered 2023-10-02 (×2): 10 mg via INTRAVENOUS
  Administered 2023-10-02: 5 mg via INTRAVENOUS

## 2023-10-02 MED ORDER — ROCURONIUM BROMIDE 10 MG/ML (PF) SYRINGE
PREFILLED_SYRINGE | INTRAVENOUS | Status: DC | PRN
  Administered 2023-10-02: 60 mg via INTRAVENOUS

## 2023-10-02 MED ORDER — FENTANYL CITRATE (PF) 100 MCG/2ML IJ SOLN
INTRAMUSCULAR | Status: AC
Start: 2023-10-02 — End: ?
  Filled 2023-10-02: qty 2

## 2023-10-02 MED ORDER — ONDANSETRON HCL 4 MG/2ML IJ SOLN
INTRAMUSCULAR | Status: DC | PRN
  Administered 2023-10-02: 4 mg via INTRAVENOUS

## 2023-10-02 MED ORDER — ACETAMINOPHEN 500 MG PO TABS
1000.0000 mg | ORAL_TABLET | ORAL | Status: AC
  Administered 2023-10-02: 1000 mg via ORAL
  Filled 2023-10-02: qty 2

## 2023-10-02 MED ORDER — LACTATED RINGERS IV SOLN: INTRAVENOUS | Status: DC

## 2023-10-02 MED ORDER — HYDROMORPHONE HCL 1 MG/ML IJ SOLN: 0.2500 mg | INTRAMUSCULAR | Status: DC | PRN

## 2023-10-02 MED ORDER — ORAL CARE MOUTH RINSE: 15.0000 mL | Freq: Once | OROMUCOSAL | Status: AC

## 2023-10-02 MED ORDER — ROCURONIUM BROMIDE 10 MG/ML (PF) SYRINGE
PREFILLED_SYRINGE | INTRAVENOUS | Status: AC
  Filled 2023-10-02: qty 10

## 2023-10-02 MED ORDER — ENSURE PRE-SURGERY PO LIQD: 296.0000 mL | Freq: Once | ORAL | Status: DC

## 2023-10-02 MED ORDER — DROPERIDOL 2.5 MG/ML IJ SOLN: 0.6250 mg | Freq: Once | INTRAMUSCULAR | Status: DC | PRN

## 2023-10-02 MED ORDER — SODIUM CHLORIDE (PF) 0.9 % IJ SOLN
INTRAMUSCULAR | Status: AC
  Filled 2023-10-02: qty 10

## 2023-10-02 MED ORDER — LIDOCAINE 2% (20 MG/ML) 5 ML SYRINGE
INTRAMUSCULAR | Status: DC | PRN
  Administered 2023-10-02: 100 mg via INTRAVENOUS

## 2023-10-02 MED ORDER — DEXAMETHASONE SODIUM PHOSPHATE 10 MG/ML IJ SOLN
INTRAMUSCULAR | Status: DC | PRN
  Administered 2023-10-02: 10 mg via INTRAVENOUS

## 2023-10-02 MED ORDER — PROPOFOL 10 MG/ML IV BOLUS
INTRAVENOUS | Status: AC
  Filled 2023-10-02: qty 20

## 2023-10-02 MED ORDER — BUPIVACAINE-EPINEPHRINE (PF) 0.25% -1:200000 IJ SOLN
INTRAMUSCULAR | Status: AC
  Filled 2023-10-02: qty 30

## 2023-10-02 MED ORDER — BUPIVACAINE-EPINEPHRINE 0.25% -1:200000 IJ SOLN
INTRAMUSCULAR | Status: DC | PRN
  Administered 2023-10-02: 30 mL

## 2023-10-02 MED ORDER — HYDROMORPHONE HCL 2 MG/ML IJ SOLN
INTRAMUSCULAR | Status: AC
  Filled 2023-10-02: qty 1

## 2023-10-02 MED ORDER — ONDANSETRON HCL 4 MG/2ML IJ SOLN
INTRAMUSCULAR | Status: AC
  Filled 2023-10-02: qty 2

## 2023-10-02 MED ORDER — CHLORHEXIDINE GLUCONATE 0.12 % MT SOLN
15.0000 mL | Freq: Once | OROMUCOSAL | Status: AC
  Administered 2023-10-02: 15 mL via OROMUCOSAL

## 2023-10-02 MED ORDER — DEXAMETHASONE SODIUM PHOSPHATE 10 MG/ML IJ SOLN
INTRAMUSCULAR | Status: AC
  Filled 2023-10-02: qty 1

## 2023-10-02 MED ORDER — EPHEDRINE 5 MG/ML INJ
INTRAVENOUS | Status: AC
  Filled 2023-10-02: qty 5

## 2023-10-02 SURGICAL SUPPLY — 32 items
CABLE HIGH FREQUENCY MONO STRZ (ELECTRODE) ×1 IMPLANT
CHLORAPREP W/TINT 26 (MISCELLANEOUS) ×1 IMPLANT
CLIP APPLIE 5 13 M/L LIGAMAX5 (MISCELLANEOUS) IMPLANT
DERMABOND ADVANCED .7 DNX12 (GAUZE/BANDAGES/DRESSINGS) ×1 IMPLANT
DISSECTOR BLUNT TIP ENDO 5MM (MISCELLANEOUS) IMPLANT
ELECT REM PT RETURN 15FT ADLT (MISCELLANEOUS) ×1 IMPLANT
ENDOLOOP SUT PDS II 0 18 (SUTURE) IMPLANT
GLOVE BIO SURGEON STRL SZ7.5 (GLOVE) ×1 IMPLANT
GOWN STRL REUS W/ TWL XL LVL3 (GOWN DISPOSABLE) ×2 IMPLANT
IRRIGATION SUCT STRKRFLW 2 WTP (MISCELLANEOUS) IMPLANT
KIT BASIN OR (CUSTOM PROCEDURE TRAY) ×1 IMPLANT
KIT TURNOVER KIT A (KITS) IMPLANT
MARKER SKIN DUAL TIP RULER LAB (MISCELLANEOUS) ×1 IMPLANT
MESH 3DMAX 4X6 LT LRG (Mesh General) IMPLANT
MESH 3DMAX 5X7 RT XLRG (Mesh General) ×1 IMPLANT
NDL INSUFFLATION 14GA 120MM (NEEDLE) IMPLANT
NEEDLE INSUFFLATION 14GA 120MM (NEEDLE) IMPLANT
RELOAD STAPLE 4.0 BLU F/HERNIA (INSTRUMENTS) ×1 IMPLANT
RELOAD STAPLE 4.8 BLK F/HERNIA (STAPLE) IMPLANT
RELOAD STAPLE HERNIA 4.0 BLUE (INSTRUMENTS) ×1 IMPLANT
RELOAD STAPLE HERNIA 4.8 BLK (STAPLE) IMPLANT
SCISSORS LAP 5X35 DISP (ENDOMECHANICALS) IMPLANT
SET TUBE SMOKE EVAC HIGH FLOW (TUBING) ×1 IMPLANT
SPIKE FLUID TRANSFER (MISCELLANEOUS) ×1 IMPLANT
STAPLER HERNIA 12 8.5 360D (INSTRUMENTS) ×1 IMPLANT
SUT MNCRL AB 4-0 PS2 18 (SUTURE) ×1 IMPLANT
SUT VIC AB 1 CT1 27XBRD ANBCTR (SUTURE) IMPLANT
TOWEL OR 17X26 10 PK STRL BLUE (TOWEL DISPOSABLE) ×1 IMPLANT
TRAY LAPAROSCOPIC (CUSTOM PROCEDURE TRAY) ×1 IMPLANT
TROCAR OPTICAL SHORT 5MM (TROCAR) ×1 IMPLANT
TROCAR OPTICAL SLV SHORT 5MM (TROCAR) ×1 IMPLANT
TROCAR Z THREAD OPTICAL 12X100 (TROCAR) ×1 IMPLANT

## 2023-10-02 NOTE — Discharge Instructions (Signed)

## 2023-10-02 NOTE — Transfer of Care (Signed)
 Immediate Anesthesia Transfer of Care Note  Patient: Nathaniel [name]  Procedure(s) Performed: Procedure(s) with comments: REPAIR, HERNIA, INGUINAL, BILATERAL, LAPAROSCOPIC (Bilateral) - LAPAROSCOPIC BILATERAL INGUINAL HERNIA REPAIR WITH MESH  Patient Location: PACU  Anesthesia Type:General  Level of Consciousness:  sedated, patient cooperative and responds to stimulation  Airway & Oxygen Therapy:Patient Spontanous Breathing and Patient connected to face mask oxgen  Post-op Assessment:  Report given to PACU RN and Post -op Vital signs reviewed and stable  Post vital signs:  Reviewed and stable  Last Vitals:  Vitals:   10/02/23 0837  BP: (!) 151/71  Pulse: (!) 44  Resp: 16  Temp: 36.4 C  SpO2: 98%    Complications: No apparent anesthesia complications

## 2023-10-02 NOTE — Anesthesia Postprocedure Evaluation (Signed)
 Anesthesia Post Note  Patient: Nathaniel [name]  Procedure(s) Performed: REPAIR, HERNIA, INGUINAL, BILATERAL, LAPAROSCOPIC (Bilateral)     Patient location during evaluation: PACU Anesthesia Type: General Level of consciousness: awake and alert Pain management: pain level controlled Vital Signs Assessment: post-procedure vital signs reviewed and stable Respiratory status: spontaneous breathing, nonlabored ventilation and respiratory function stable Cardiovascular status: blood pressure returned to baseline and stable Postop Assessment: no apparent nausea or vomiting Anesthetic complications: no   No notable events documented.  Last Vitals:  Vitals:   10/02/23 1245 10/02/23 1248  BP: (!) 138/57 125/64  Pulse: (!) 46 (!) 48  Resp:    Temp:    SpO2: 98% 98%    Last Pain:  Vitals:   10/02/23 1223  TempSrc: Temporal  PainSc: 4                  Erin Havers

## 2023-10-02 NOTE — Anesthesia Procedure Notes (Signed)
 Procedure Name: Intubation Date/Time: 10/02/2023 10:42 AM  Performed by: Erleen Hawking, CRNAPre-anesthesia Checklist: Patient identified, Emergency Drugs available, Suction available, Patient being monitored and Timeout performed Patient Re-evaluated:Patient Re-evaluated prior to induction Oxygen Delivery Method: Circle system utilized Preoxygenation: Pre-oxygenation with 100% oxygen Induction Type: IV induction Ventilation: Mask ventilation without difficulty Laryngoscope Size: Glidescope and 3 Grade View: Grade I Tube type: Oral Tube size: 7.0 mm Number of attempts: 1 Airway Equipment and Method: Stylet Placement Confirmation: ETT inserted through vocal cords under direct vision, positive ETCO2, CO2 detector and breath sounds checked- equal and bilateral Secured at: 23 cm Tube secured with: Tape Dental Injury: Teeth and Oropharynx as per pre-operative assessment  Comments: ATOI

## 2023-10-02 NOTE — Interval H&P Note (Signed)
 History and Physical Interval Note:  10/02/2023 9:56 AM  Nathaniel [name]  has presented today for surgery, with the diagnosis of BILATERAL INGUINAL HERNIA.  The various methods of treatment have been discussed with the patient and family. After consideration of risks, benefits and other options for treatment, the patient has consented to  Procedure(s) with comments: REPAIR, HERNIA, INGUINAL, BILATERAL, LAPAROSCOPIC (Bilateral) - LAPAROSCOPIC BILATERAL INGUINAL HERNIA REPAIR WITH MESH as a surgical intervention.  The patient's history has been reviewed, patient examined, no change in status, stable for surgery.  I have reviewed the patient's chart and labs.  Questions were answered to the patient's satisfaction.     [name]

## 2023-10-02 NOTE — Op Note (Signed)
 10/02/2023  11:22 AM  PATIENT:  Nathaniel [name]  78 y.o. male  PRE-OPERATIVE DIAGNOSIS:  BILATERAL INGUINAL HERNIA  POST-OPERATIVE DIAGNOSIS:  BILATERAL INDIRECT INGUINAL HERNIA  PROCEDURE:  Procedure(s) with comments: REPAIR, HERNIA, INGUINAL, BILATERAL, LAPAROSCOPIC (Bilateral) - LAPAROSCOPIC BILATERAL INGUINAL HERNIA REPAIR WITH MESH  SURGEON:  Surgeons and Role:    * Shela Derby, MD - Primary  ASSISTANTS: none   ANESTHESIA:   local and general  EBL:  minimal   BLOOD ADMINISTERED:none  DRAINS: none   LOCAL MEDICATIONS USED:  BUPIVICAINE   SPECIMEN:  No Specimen  DISPOSITION OF SPECIMEN:  N/A  COUNTS:  YES  TOURNIQUET:  * No tourniquets in log *  DICTATION: .Dragon Dictation    Counts: reported as correct x 2  Findings:  The patient had a small INDIRECT LEFT & large INdirect hernias  Indications for procedure:  The patient is a 78 year old male with a bilateral hernias for several months. Patient complained of symptomatology to his bilateral inguinal areas. The patient was taken back for elective inguinal hernia repair.  Details of the procedure:   The patient was taken back to the operating room. The patient was placed in supine position with bilateral SCDs in place.  The patient underwent GETA.  The patient was prepped and draped in the usual sterile fashion.  After appropriate anitbiotics were confirmed, a time-out was confirmed and all facts were verified.  0.25% Marcaine  was used to infiltrate the umbilical area. A 11-blade was used to cut down the skin and blunt dissection was used to get the anterior fashion.  The anterior fascia was incised approximately 1 cm and the muscles were retracted laterally. Blunt dissection was then used to create a space in the preperitoneal area. At this time a 10 mm camera was then introduced into the space and advanced the pubic tubercle and a 12 mm trocar was placed over this and insufflation was started.   At this  time and space was created from medial to laterally the preperitoneal space.  Cooper's ligament was initially cleaned off.  There was no direct hernia was seen.    The spermatic cord was identified and dissected away from the cremesteric muscle fibers.  This was circumferentially dissected. The hernia was seen in the indirect space. Dissection of the hernia sac was undertaken the vas deferens was identified and protected in all parts of the case.  There was a large right cord lipoma that was dissected away from the inguinal canal.  The peritoneum was dissected back to approximate level of the umbilicus.  At this time we turned intension to the left side.  The pubic tubercle was dissected away.  I continued to proceed laterally.  The spermatic cord was identified.  This was our eventually dissected away.  The vas deferens identified and protected.  At this time there was a left cord lipoma.  This was dissected out of the inguinal canal.  At this time the peritoneum was dissected back to the level of the umbilicus.   A Bard 3D Max mesh, size: Madelon Scheuermann was placed on the right and a large on the left.  This was  introduced into the preperitoneal space.  The mesh was brought over to cover the direct and indirect and femoral hernia spaces.  This was anchored into place and secured to Cooper's ligament with 4.59mm staples from a [place] hernia stapler. It was anchored to the anterior abdominal wall with 4.8 mm staples. The hernia sac was seen lying posterior  to the mesh. There was no staples placed laterally.   The insufflation was evacuated and the peritoneum was seen posterior to the mesh bilaterally. The trochars were removed. The anterior fascia was reapproximated using #1 Vicryl on a UR- 6.  Intra-abdominal air was evacuated and the Veress needle removed. The skin was reapproximated using 4-0 Monocryl subcuticular fashion and was dressed with Dermabond. The  patient was awakened from general anesthesia and taken  to recovery in stable condition.   PLAN OF CARE: Discharge to home after PACU  PATIENT DISPOSITION:  PACU - hemodynamically stable.   Delay start of Pharmacological VTE agent (>24hrs) due to surgical blood loss or risk of bleeding: not applicable

## 2023-10-03 ENCOUNTER — Encounter (HOSPITAL_COMMUNITY): Payer: Self-pay | Admitting: General Surgery

## 2023-10-31 DIAGNOSIS — L299 Pruritus, unspecified: Secondary | ICD-10-CM | POA: Diagnosis not present

## 2023-10-31 DIAGNOSIS — R600 Localized edema: Secondary | ICD-10-CM | POA: Diagnosis not present

## 2023-10-31 DIAGNOSIS — I83893 Varicose veins of bilateral lower extremities with other complications: Secondary | ICD-10-CM | POA: Diagnosis not present

## 2023-10-31 DIAGNOSIS — R252 Cramp and spasm: Secondary | ICD-10-CM | POA: Diagnosis not present

## 2023-10-31 DIAGNOSIS — I872 Venous insufficiency (chronic) (peripheral): Secondary | ICD-10-CM | POA: Diagnosis not present

## 2023-11-14 DIAGNOSIS — I83811 Varicose veins of right lower extremities with pain: Secondary | ICD-10-CM | POA: Diagnosis not present

## 2023-11-14 DIAGNOSIS — M7989 Other specified soft tissue disorders: Secondary | ICD-10-CM | POA: Diagnosis not present

## 2023-11-14 DIAGNOSIS — I83891 Varicose veins of right lower extremities with other complications: Secondary | ICD-10-CM | POA: Diagnosis not present

## 2023-11-15 DIAGNOSIS — I83812 Varicose veins of left lower extremities with pain: Secondary | ICD-10-CM | POA: Diagnosis not present

## 2023-11-15 DIAGNOSIS — I83892 Varicose veins of left lower extremities with other complications: Secondary | ICD-10-CM | POA: Diagnosis not present

## 2023-11-21 ENCOUNTER — Telehealth: Payer: Self-pay | Admitting: Cardiovascular Disease

## 2023-11-21 MED ORDER — ROSUVASTATIN CALCIUM 20 MG PO TABS: 20.0000 mg | ORAL_TABLET | Freq: Every day | ORAL | 1 refills | Status: DC

## 2023-11-21 NOTE — Telephone Encounter (Signed)
*  STAT* If patient is at the pharmacy, call can be transferred to refill team.   1. Which medications need to be refilled? (please list name of each medication and dose if known) rosuvastatin  (CRESTOR ) 20 MG tablet   2. Which pharmacy/location (including street and city if local pharmacy) is medication to be sent to? [id number] - [place], [place] - [address] AT [address]   3. Do they need a 30 day or 90 day supply? 90

## 2023-11-21 NOTE — Telephone Encounter (Signed)
 Pt's medication was sent to pt's pharmacy as requested. Confirmation received.

## 2023-11-22 DIAGNOSIS — M1711 Unilateral primary osteoarthritis, right knee: Secondary | ICD-10-CM | POA: Diagnosis not present

## 2023-11-23 DIAGNOSIS — D631 Anemia in chronic kidney disease: Secondary | ICD-10-CM | POA: Diagnosis not present

## 2023-11-23 DIAGNOSIS — R7303 Prediabetes: Secondary | ICD-10-CM | POA: Diagnosis not present

## 2023-11-23 DIAGNOSIS — N1831 Chronic kidney disease, stage 3a: Secondary | ICD-10-CM | POA: Diagnosis not present

## 2023-11-23 DIAGNOSIS — N189 Chronic kidney disease, unspecified: Secondary | ICD-10-CM | POA: Diagnosis not present

## 2023-11-23 DIAGNOSIS — I129 Hypertensive chronic kidney disease with stage 1 through stage 4 chronic kidney disease, or unspecified chronic kidney disease: Secondary | ICD-10-CM | POA: Diagnosis not present

## 2023-11-27 ENCOUNTER — Other Ambulatory Visit: Payer: Self-pay | Admitting: Nephrology

## 2023-11-27 DIAGNOSIS — N1831 Chronic kidney disease, stage 3a: Secondary | ICD-10-CM

## 2023-11-28 ENCOUNTER — Ambulatory Visit
Admission: RE | Admit: 2023-11-28 | Discharge: 2023-11-28 | Disposition: A | Discharge status: Home / Self Care | Source: Ambulatory Visit | Attending: Nephrology | Admitting: Nephrology

## 2023-11-28 DIAGNOSIS — N1831 Chronic kidney disease, stage 3a: Secondary | ICD-10-CM

## 2023-11-28 DIAGNOSIS — N183 Chronic kidney disease, stage 3 unspecified: Secondary | ICD-10-CM | POA: Diagnosis not present

## 2023-11-28 DIAGNOSIS — I83891 Varicose veins of right lower extremities with other complications: Secondary | ICD-10-CM | POA: Diagnosis not present

## 2023-11-28 DIAGNOSIS — I87391 Chronic venous hypertension (idiopathic) with other complications of right lower extremity: Secondary | ICD-10-CM | POA: Diagnosis not present

## 2023-11-29 DIAGNOSIS — D485 Neoplasm of uncertain behavior of skin: Secondary | ICD-10-CM | POA: Diagnosis not present

## 2023-11-29 DIAGNOSIS — L820 Inflamed seborrheic keratosis: Secondary | ICD-10-CM | POA: Diagnosis not present

## 2023-12-12 DIAGNOSIS — I83811 Varicose veins of right lower extremities with pain: Secondary | ICD-10-CM | POA: Diagnosis not present

## 2023-12-12 DIAGNOSIS — I82401 Acute embolism and thrombosis of unspecified deep veins of right lower extremity: Secondary | ICD-10-CM | POA: Diagnosis not present

## 2023-12-18 DIAGNOSIS — I83893 Varicose veins of bilateral lower extremities with other complications: Secondary | ICD-10-CM | POA: Diagnosis not present

## 2023-12-18 DIAGNOSIS — I82401 Acute embolism and thrombosis of unspecified deep veins of right lower extremity: Secondary | ICD-10-CM | POA: Diagnosis not present

## 2023-12-18 DIAGNOSIS — R600 Localized edema: Secondary | ICD-10-CM | POA: Diagnosis not present

## 2023-12-26 DIAGNOSIS — I82401 Acute embolism and thrombosis of unspecified deep veins of right lower extremity: Secondary | ICD-10-CM | POA: Diagnosis not present

## 2023-12-26 DIAGNOSIS — M79661 Pain in right lower leg: Secondary | ICD-10-CM | POA: Diagnosis not present

## 2023-12-26 DIAGNOSIS — R600 Localized edema: Secondary | ICD-10-CM | POA: Diagnosis not present

## 2023-12-31 DIAGNOSIS — I83811 Varicose veins of right lower extremities with pain: Secondary | ICD-10-CM | POA: Diagnosis not present

## 2023-12-31 DIAGNOSIS — I82501 Chronic embolism and thrombosis of unspecified deep veins of right lower extremity: Secondary | ICD-10-CM | POA: Diagnosis not present

## 2024-01-07 DIAGNOSIS — I83811 Varicose veins of right lower extremities with pain: Secondary | ICD-10-CM | POA: Diagnosis not present

## 2024-01-07 DIAGNOSIS — M7989 Other specified soft tissue disorders: Secondary | ICD-10-CM | POA: Diagnosis not present

## 2024-01-07 DIAGNOSIS — I82502 Chronic embolism and thrombosis of unspecified deep veins of left lower extremity: Secondary | ICD-10-CM | POA: Diagnosis not present

## 2024-02-07 DIAGNOSIS — N138 Other obstructive and reflux uropathy: Secondary | ICD-10-CM | POA: Diagnosis not present

## 2024-02-13 ENCOUNTER — Encounter: Payer: Self-pay | Admitting: Cardiovascular Disease

## 2024-02-13 ENCOUNTER — Ambulatory Visit: Attending: Cardiovascular Disease | Admitting: Cardiovascular Disease

## 2024-02-13 VITALS — BP 100/66 | HR 39 | Ht 66.0 in | Wt 163.2 lb

## 2024-02-13 DIAGNOSIS — I25119 Atherosclerotic heart disease of native coronary artery with unspecified angina pectoris: Secondary | ICD-10-CM

## 2024-02-13 DIAGNOSIS — E782 Mixed hyperlipidemia: Secondary | ICD-10-CM | POA: Diagnosis not present

## 2024-02-13 DIAGNOSIS — R001 Bradycardia, unspecified: Secondary | ICD-10-CM

## 2024-02-13 DIAGNOSIS — I5020 Unspecified systolic (congestive) heart failure: Secondary | ICD-10-CM | POA: Diagnosis not present

## 2024-02-13 DIAGNOSIS — R002 Palpitations: Secondary | ICD-10-CM | POA: Diagnosis not present

## 2024-02-13 NOTE — Assessment & Plan Note (Addendum)
 Last lipids 2024 showed cholesterol 118, LDL 60, HDL 45, triglycerides 56.  Continue current management.  Lipids to be drawn at PCP visit in near future.  Treated with rosuvastatin  20 mg daily.

## 2024-02-13 NOTE — Progress Notes (Signed)
 Cardiology Office Note:    Date:  02/13/2024   ID:  Nathaniel [name], DOB [birth year], MRN [id number]  PCP:  Nathaniel Trula SQUIBB, MD   [facility] Providers Cardiologist:  Nathaniel Fell, MD     Referring MD: Nathaniel Trula SQUIBB, MD   Chief Complaint  Patient presents with   Coronary Artery Disease    History of Present Illness:    Nathaniel [name] is a 78 y.o. male with a hx of coronary artery disease and microvascular angina, presenting for follow-up evaluation. The patient also has chronic bradycardia and mixed hyperlipidemia. He presented with non-STEMI in 2023 and was found to have severe diagonal stenosis, undergoing PCI with balloon angioplasty at that time. He had recurrent non-STEMI while traveling in June 2024 and underwent cardiac catheterization demonstrating stable coronary findings with nonobstructive CAD. When I saw him last in August 2024 he had persistent angina, primarily at night when lying supine. I recommended a cardiac PET scan which was performed April 11, 2023. This demonstrated a small, mild reversible defect in the mid to apical anterior wall consistent with ischemia, normal LVEF response to stress, no transient ischemic dilatation and no evidence of MI. His resting LVEF was calculated at 44% and stress LVEF was 50%. Overall the study was interpreted as low risk. I was able to obtain the patient's cardiac catheterization films from the outside hospital from his heart catheterization performed 11/22/2022 and on my personal review comparing those films to his previous, there is no change and there is nonobstructive disease present in the LAD and diagonal branches and no stenosis in the circumflex, left main, or RCA.   The patient is here alone today.  With respect to his CAD, he reports improvement in his anginal symptoms.  States that he is having very rare symptoms of angina with lower intensity than in the past.  He reports no recent change in his medical regimen.  He  denies shortness of breath or leg edema.  He has some problems with fatigue in the midday and frequently takes a nap because he feels exhausted in the middle of the afternoon.  He tries to minimize caffeine because of its association with heart palpitations.  His palpitations are clinically stable.  He had an episode of dizziness yesterday but he has not had any other problems of lightheadedness, presyncope, or frank syncope.   Current Medications: Current Meds  Medication Sig   amLODipine  (NORVASC ) 2.5 MG tablet TAKE 1 TABLET(2.5 MG) BY MOUTH DAILY   aspirin  EC (ASPIRIN  LOW DOSE) 81 MG tablet Take 1 tablet (81 mg total) by mouth daily. SWALLOW WHOLE   azelastine (ASTELIN) 0.1 % nasal spray Place 1-2 sprays into both nostrils 2 (two) times daily as needed for rhinitis or allergies.   cholecalciferol  (VITAMIN D ) 1000 UNITS tablet Take 1,000 Units by mouth every morning.    ciclopirox (LOPROX) 0.77 % cream Apply 1 Application topically 2 (two) times daily as needed (rash).   clobetasol cream (TEMOVATE) 0.05 % Apply 1 Application topically daily as needed (Skin rash).   clopidogrel  (PLAVIX ) 75 MG tablet TAKE 1 TABLET(75 MG) BY MOUTH DAILY WITH BREAKFAST   Cyanocobalamin  (VITAMIN B-12) 2500 MCG SUBL Place 2,500 mcg under the tongue once a week.   desoximetasone (TOPICORT) 0.25 % cream Apply 1 Application topically daily as needed (itching on the back).   ferrous sulfate 325 (65 FE) MG EC tablet Take 325 mg by mouth every evening.   loratadine (CLARITIN) 10 MG tablet  Take 10 mg by mouth daily as needed for allergies.   Magnesium  250 MG TABS Take 500 mg by mouth at bedtime.   mupirocin cream (BACTROBAN) 2 % Apply 1 Application topically 2 (two) times daily as needed (rash).   nitroGLYCERIN  (NITROSTAT ) 0.4 MG SL tablet Dissolve 1 tablet under the tongue every 5 minutes as needed for chest pain. Max of 3 doses, then 911.   omega-3 acid ethyl esters (LOVAZA) 1 g capsule Take 1 g by mouth at bedtime.    pantoprazole  (PROTONIX ) 40 MG tablet Take 1 tablet (40 mg total) by mouth daily at 10 pm.   rosuvastatin  (CRESTOR ) 20 MG tablet Take 1 tablet (20 mg total) by mouth daily.   senna (SENOKOT) 8.6 MG tablet Take 2 tablets by mouth 2 (two) times daily.   sildenafil (VIAGRA) 100 MG tablet Take 100 mg by mouth daily as needed for erectile dysfunction.   sodium chloride  (OCEAN) 0.65 % SOLN nasal spray Place 1 spray into both nostrils daily as needed for congestion.   tamsulosin  (FLOMAX ) 0.4 MG CAPS capsule Take 0.4 mg by mouth at bedtime.    traMADol  (ULTRAM ) 50 MG tablet Take 1 tablet (50 mg total) by mouth every 6 (six) hours as needed.   [DISCONTINUED] chlorpheniramine (CHLOR-TRIMETON) 4 MG tablet Take 4 mg by mouth at bedtime as needed for allergies.     Allergies:   Penicillins, Fentanyl , Imdur [isosorbide dinitrate], and Latex   ROS:   Please see the history of present illness.    All other systems reviewed and are negative.  EKGs/Labs/Other Studies Reviewed:    The following studies were reviewed today: Cardiac Studies & Procedures   ______________________________________________________________________________________________ CARDIAC CATHETERIZATION  CARDIAC CATHETERIZATION 02/27/2022  Conclusion 1.  Patent left main with no stenosis 2.  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  Severe stenosis of the first diagonal subbranch, treated with PTCA, reducing 95% stenosis to 30% with TIMI-3 flow at the completion of the procedure and no evidence of dissection 4.  Patent left circumflex with no stenosis 5.  Patent RCA (dominant vessel) with no significant stenosis  Recommend: Continued aggressive medical therapy.  DAPT with aspirin  and clopidogrel  x12 months without interruption following non-STEMI.  Consider Pharm.D. clinic referral for PCSK9 inhibition to push LDL cholesterol to 55 or less  Findings Coronary  Findings Diagnostic  Dominance: Right  Left Main The vessel exhibits minimal luminal irregularities.  Left Anterior Descending There is mild diffuse disease throughout the vessel. Prox LAD to Mid LAD lesion is 30% stenosed. Intramyocardial bridging is present through this region, more prominent after administration of intracoronary nitroglycerin   Lateral First Diagonal Branch Lat 1st Diag lesion is 95% stenosed.  Left Circumflex The vessel exhibits minimal luminal irregularities.  Right Coronary Artery The vessel exhibits minimal luminal irregularities.  Intervention  Lat 1st Diag lesion Angioplasty CATH VISTA GUIDE 6FR XBLAD3.5 guide catheter was inserted. WIRE COUGAR XT STRL 190CM guidewire used to cross lesion. Balloon angioplasty was performed using a BALLN SAPPHIRE 2.0X15. Maximum pressure: 6 atm. Inflation time: 45 sec. Post-Intervention Lesion Assessment The intervention was successful. Pre-interventional TIMI flow is 3. Post-intervention TIMI flow is 3. No complications occurred at this lesion. There is a 30% residual stenosis post intervention.   STRESS TESTS  NM PET CT CARDIAC PERFUSION MULTI W/ABSOLUTE BLOODFLOW 04/11/2023  Narrative   Small, mild, reversible defect in the mid to apical anterior segments consistent with ischemia. Normal LVEF response  to stress. No TID. MBFR is abnormal (1.45). If there are no concerns for obstructive 3-vessel CAD, the reduced MBFR represents microvascular dysfunction. Low-risk study by perfusion alone.   LV perfusion is abnormal. There is evidence of ischemia. There is no evidence of infarction. Defect 1: There is a small defect with mild reduction in uptake present in the apical to mid anterior location(s) that is reversible. There is normal wall motion in the defect area. Consistent with ischemia. The defect is consistent with abnormal perfusion in the LAD territory.   Rest left ventricular function is abnormal. Rest global function  is mildly reduced. There were no regional wall motion abnormalities. Rest EF: 44%. Stress left ventricular function is normal. Stress EF: 50%. End diastolic cavity size is normal.   Myocardial blood flow was computed to be 0.80ml/g/min at rest and 1.05ml/g/min at stress. Global myocardial blood flow reserve was 1.46 and was abnormal.   Coronary calcium  assessment not performed due to prior revascularization.   Findings are consistent with ischemia. The study is low risk.  Electronically signed by Darryle Decent, MD _________________________________________________________________________________________________________  Nathaniel [name]: OVER-READ INTERPRETATION  CT CHEST  The following report is a limited chest CT over-read performed by radiologist Dr. Elsie Ko [facility] on 04/11/2023. This over-read does not include interpretation of cardiac or coronary anatomy or pathology nor does it include evaluation of the PET data. The cardiac PET-CT interpretation by the cardiologist is attached.  COMPARISON:  Chest radiographs 02/26/2022. Chest CTA 12/05/2022 and 04/02/2011  FINDINGS: Mediastinum/Nodes: No enlarged lymph nodes within the visualized mediastinum.  Lungs/Pleura: No pleural effusion or pneumothorax. Mild dependent atelectasis at both lung bases. No confluent airspace disease or suspicious nodularity.  Upper abdomen: No suspicious findings are seen in the visualized upper abdomen. There is a low-density lesion posteriorly in the right hepatic lobe which measures approximately 1.8 cm on image 66/3, stable from 2012 CTA, consistent with a benign finding.  Musculoskeletal/Chest wall: No chest wall mass or suspicious osseous findings within the visualized chest. Bilateral gynecomastia. Mild spondylosis.  IMPRESSION: No significant extracardiac findings within the visualized chest.   Electronically Signed By: Elsie Perone M.D. On: 04/11/2023 11:35    ECHOCARDIOGRAM  ECHOCARDIOGRAM COMPLETE 05/31/2023  Narrative ECHOCARDIOGRAM REPORT    Patient Name:   DR. Nancyann RAMAN Wasko Date of Exam: 05/31/2023 Medical Rec #:  [id number]             Height:       66.0 in Accession #:    [id number]            Weight:       167.6 lb Date of Birth:  15-Oct-1945             BSA:          1.855 m Patient Age:    77 years              BP:           90/60 mmHg Patient Gender: M                     HR:           47 bpm. Exam Location:  [address]  Procedure: 2D Echo, Cardiac Doppler and Color Doppler  Indications:    I25.10 CAD  History:        Patient has prior history of Echocardiogram examinations, most recent 02/27/2022. CHF, CAD and Previous Myocardial Infarction, Mitral Valve Prolapse, Arrythmias:Bradycardia and  PVC, Signs/Symptoms:Shortness of Breath and Chest Pain; Risk Factors:Family History of Coronary Artery Disease and Dyslipidemia. Palpitaitons, DVT with Pulmonary Embolism (2012), Evaluate for Reduced Ejection Fraction (reported by recent PET/CT- 40-45%).  Sonographer:    Heather Hawks RDCS Referring Phys: Nathaniel Aleta Manternach  IMPRESSIONS   1. Left ventricular ejection fraction, by estimation, is 60 to 65%. The left ventricle has normal function. The left ventricle has no regional wall motion abnormalities. There is mild concentric left ventricular hypertrophy. Left ventricular diastolic parameters are consistent with Grade I diastolic dysfunction (impaired relaxation). 2. Right ventricular systolic function is normal. The right ventricular size is normal. 3. Left atrial size was mildly dilated. 4. Right atrial size was moderately dilated. 5. The mitral valve is normal in structure. Trivial mitral valve regurgitation. No evidence of mitral stenosis. 6. The aortic valve is tricuspid. There is mild calcification of the aortic valve. Aortic valve regurgitation is mild. Aortic valve sclerosis/calcification is present, without any  evidence of aortic stenosis. Aortic regurgitation PHT measures 754 msec. 7. Aortic dilatation noted. There is borderline dilatation of the ascending aorta, measuring 38 mm. 8. The inferior vena cava is normal in size with <50% respiratory variability, suggesting right atrial pressure of 8 mmHg.  FINDINGS Left Ventricle: Left ventricular ejection fraction, by estimation, is 60 to 65%. The left ventricle has normal function. The left ventricle has no regional wall motion abnormalities. The left ventricular internal cavity size was normal in size. There is mild concentric left ventricular hypertrophy. Left ventricular diastolic parameters are consistent with Grade I diastolic dysfunction (impaired relaxation).  Right Ventricle: The right ventricular size is normal. No increase in right ventricular wall thickness. Right ventricular systolic function is normal.  Left Atrium: Left atrial size was mildly dilated.  Right Atrium: Right atrial size was moderately dilated.  Pericardium: There is no evidence of pericardial effusion.  Mitral Valve: The mitral valve is normal in structure. Trivial mitral valve regurgitation. No evidence of mitral valve stenosis.  Tricuspid Valve: The tricuspid valve is normal in structure. Tricuspid valve regurgitation is mild . No evidence of tricuspid stenosis.  Aortic Valve: The aortic valve is tricuspid. There is mild calcification of the aortic valve. Aortic valve regurgitation is mild. Aortic regurgitation PHT measures 754 msec. Aortic valve sclerosis/calcification is present, without any evidence of aortic stenosis. Aortic valve mean gradient measures 5.0 mmHg. Aortic valve peak gradient measures 10.6 mmHg. Aortic valve area, by VTI measures 2.52 cm.  Pulmonic Valve: The pulmonic valve was normal in structure. Pulmonic valve regurgitation is mild. No evidence of pulmonic stenosis.  Aorta: Aortic dilatation noted. There is borderline dilatation of the ascending  aorta, measuring 38 mm.  Venous: The inferior vena cava is normal in size with less than 50% respiratory variability, suggesting right atrial pressure of 8 mmHg.  IAS/Shunts: No atrial level shunt detected by color flow Doppler.   LEFT VENTRICLE PLAX 2D LVIDd:         5.10 cm   Diastology LVIDs:         3.00 cm   LV e' medial:    4.68 cm/s LV PW:         0.80 cm   LV E/e' medial:  19.0 LV IVS:        1.00 cm   LV e' lateral:   7.83 cm/s LVOT diam:     2.20 cm   LV E/e' lateral: 11.3 LV SV:         102 LV SV Index:  55 LVOT Area:     3.80 cm   RIGHT VENTRICLE RV Basal diam:  4.10 cm RV S prime:     12.60 cm/s TAPSE (M-mode): 2.6 cm RVSP:           37.6 mmHg  LEFT ATRIUM             Index        RIGHT ATRIUM           Index LA diam:        4.00 cm 2.16 cm/m   RA Pressure: 3.00 mmHg LA Vol (A2C):   97.6 ml 52.61 ml/m  RA Area:     20.90 cm LA Vol (A4C):   45.6 ml 24.58 ml/m  RA Volume:   70.80 ml  38.17 ml/m LA Biplane Vol: 71.4 ml 38.49 ml/m AORTIC VALVE AV Area (Vmax):    2.32 cm AV Area (Vmean):   2.53 cm AV Area (VTI):     2.52 cm AV Vmax:           163.00 cm/s AV Vmean:          97.000 cm/s AV VTI:            0.404 m AV Peak Grad:      10.6 mmHg AV Mean Grad:      5.0 mmHg LVOT Vmax:         99.45 cm/s LVOT Vmean:        64.450 cm/s LVOT VTI:          0.268 m LVOT/AV VTI ratio: 0.66 AI PHT:            754 msec  AORTA Ao Root diam: 3.60 cm Ao Asc diam:  3.80 cm  MITRAL VALVE                TRICUSPID VALVE MV Area (PHT)  cm          TR Peak grad:   34.6 mmHg MV Decel Time: 256 msec     TR Vmax:        294.00 cm/s MV E velocity: 88.85 cm/s   Estimated RAP:  3.00 mmHg MV A velocity: 104.05 cm/s  RVSP:           37.6 mmHg MV E/A ratio:  0.85 SHUNTS Systemic VTI:  0.27 m Systemic Diam: 2.20 cm  Toribio Fuel MD Electronically signed by Toribio Fuel MD Signature Date/Time: 05/31/2023/3:18:19 PM    Final    MONITORS  LONG TERM MONITOR  (3-14 DAYS) 05/04/2023  Narrative Patch Wear Time:  3 days and 4 hours (2024-11-13T11:46:13-0500 to 2024-11-16T16:37:03-499)  Patient had a min HR of 31 bpm, max HR of 122 bpm, and avg HR of 44 bpm. Predominant underlying rhythm was Sinus Rhythm. First Degree AV Block was present. Bundle Branch Block/IVCD was present. 4 Supraventricular Tachycardia runs occurred, the run with the fastest interval lasting 4 beats with a max rate of 122 bpm, the longest lasting 14 beats with an avg rate of 104 bpm. Isolated SVEs were rare (<1.0%), SVE Couplets were rare (<1.0%), and no SVE Triplets were present. Isolated VEs were rare (<1.0%), and no VE Couplets or VE Triplets were present.  Summary: The basic rhythm is normal sinus with predominantly sinus bradycardia.  The average heart rate is 44 bpm.  There are no pathologic pauses greater than 3 seconds.  There is no high-grade AV block present.  There are occasional PVCs occurring at a burden of less  than 1%.  Otherwise as above.  No sustained arrhythmias.   CT SCANS  CT CORONARY MORPH W/CTA COR W/SCORE 08/23/2021  Addendum 08/23/2021  6:26 PM ADDENDUM REPORT: 08/23/2021 18:24  CLINICAL DATA:  Chest pain  EXAM: Cardiac/Coronary CTA  TECHNIQUE: A non-contrast, gated CT scan was obtained with axial slices of 3 mm through the heart for calcium  scoring. Calcium  scoring was performed using the Agatston method. A 120 kV prospective, gated, contrast cardiac scan was obtained. Gantry rotation speed was 250 msecs and collimation was 0.6 mm. Two sublingual nitroglycerin  tablets (0.8 mg) were given. The 3D data set was reconstructed in 5% intervals of the 35-75% of the R-R cycle. Diastolic phases were analyzed on a dedicated workstation using MPR, MIP, and VRT modes. The patient received 95 cc of contrast.  FINDINGS: Image quality: Excellent.  Noise artifact is: Limited.  Coronary Arteries:  Normal coronary origin.  Right dominance.  Left main: The  left main is a large caliber vessel with a normal take off from the left coronary cusp that bifurcates to form a left anterior descending artery and a left circumflex artery. There is mild calcified plaque in the proximal LM with associated stenosis of 25-49%.  Left anterior descending artery: The LAD is patent without evidence of plaque or stenosis. The LAD gives off 1 large branching diagonal. There is mild calcified plaque in the proximal LAD with associated stenosis of 25-49%. There is mild soft plaque in the mid LAD with associated stenosis of 25-49%.  Left circumflex artery: The LCX is non-dominant. The LCX gives off 2 patent obtuse marginal branches. There is mild calcified plaque in the proximal LCx with associated stenosis of 25-49%.  Right coronary artery: The RCA is dominant with normal take off from the right coronary cusp. There is no evidence of plaque or stenosis. The RCA terminates as a PDA and right posterolateral branch without evidence of plaque or stenosis.  Right Atrium: Right atrial size is within normal limits.  Right Ventricle: The right ventricular cavity is within normal limits.  Left Atrium: Left atrial size is normal in size with no left atrial appendage filling defect.  Left Ventricle: The ventricular cavity size is within normal limits. There are no stigmata of prior infarction. There is no abnormal filling defect.  Pulmonary arteries: Normal in size without proximal filling defect.  Pulmonary veins: Normal pulmonary venous drainage.  Pericardium: Normal thickness with no significant effusion or calcium  present.  Cardiac valves: The aortic valve is trileaflet without significant calcification. The mitral valve is normal structure without significant calcification.  Aorta: Normal caliber with no significant disease.  Extra-cardiac findings: See attached radiology report for non-cardiac structures.  IMPRESSION: 1. Coronary calcium  score of  149. This was 38th percentile for age-, sex, and race-matched controls.  2.  Normal coronary origin with right dominance.  3.  Mild atherosclerosis.  CAD RADS 2.  4.  Recommend preventive therapy and risk factor modification.  5.  Consider non atherosclerotic causes of chest pain.  RECOMMENDATIONS: 1. CAD-RADS 0: No evidence of CAD (0%). Consider non-atherosclerotic causes of chest pain.  2. CAD-RADS 1: Minimal non-obstructive CAD (0-24%). Consider non-atherosclerotic causes of chest pain. Consider preventive therapy and risk factor modification.  3. CAD-RADS 2: Mild non-obstructive CAD (25-49%). Consider non-atherosclerotic causes of chest pain. Consider preventive therapy and risk factor modification.  4. CAD-RADS 3: Moderate stenosis. Consider symptom-guided anti-ischemic pharmacotherapy as well as risk factor modification per guideline directed care. Additional analysis with CT FFR will be  submitted.  5. CAD-RADS 4: Severe stenosis. (70-99% or > 50% left main). Cardiac catheterization or CT FFR is recommended. Consider symptom-guided anti-ischemic pharmacotherapy as well as risk factor modification per guideline directed care. Invasive coronary angiography recommended with revascularization per published guideline statements.  6. CAD-RADS 5: Total coronary occlusion (100%). Consider cardiac catheterization or viability assessment. Consider symptom-guided anti-ischemic pharmacotherapy as well as risk factor modification per guideline directed care.  7. CAD-RADS N: Non-diagnostic study. Obstructive CAD can't be excluded. Alternative evaluation is recommended.  Wilbert Bihari, MD   Electronically Signed By: Wilbert Bihari M.D. On: 08/23/2021 18:24  Narrative EXAM: OVER-READ INTERPRETATION  CT CHEST  The following report is an over-read performed by radiologist Dr. Franky Crease of [facility] on 08/23/2021. This over-read does not include interpretation  of cardiac or coronary anatomy or pathology. The coronary CTA interpretation by the cardiologist is attached.  COMPARISON:  06/18/2017  FINDINGS: Vascular: Heart is normal size.  Aorta normal caliber.  Mediastinum/Nodes: No adenopathy  Lungs/Pleura: No confluent opacities or effusions.  Upper Abdomen: No acute findings  Musculoskeletal: Mild bilateral gynecomastia. No acute bony abnormality.  IMPRESSION: No acute extra cardiac abnormality.  Electronically Signed: By: Franky Crease M.D. On: 08/23/2021 17:08   CT SCANS  CT CORONARY MORPH W/CTA COR W/SCORE 06/18/2017  Addendum 06/18/2017  6:17 PM ADDENDUM REPORT: 06/18/2017 18:15  CLINICAL DATA:  Chest pain  EXAM: Cardiac CTA  MEDICATIONS: Sub lingual nitro. 4mg   TECHNIQUE: The patient was scanned on a Siemens 192 slice scanner. Gantry rotation speed was 240 msecs. Collimation was 0.6 mm. A 100 kV prospective scan was triggered in the ascending thoracic aorta at 35-75% of the R-R interval. Average HR during the scan was 55 bpm. The 3D data set was interpreted on a dedicated work station using MPR, MIP and VRT modes. A total of 80cc of contrast was used.  FINDINGS: Non-cardiac: See separate report from [facility].  Calcium  Score:  90 Agatston units  Coronary Arteries: Right dominant with no anomalies  LM:  Calcified plaque with mild stenosis.  LAD system: Moderate D1 without significant disease. Mixed plaque in the proximal LAD with mild stenosis. The proximal LAD then passes intramyocardially (bridging) for a short segment.  Circumflex system: Mixed plaque with mild stenosis in the proximal vessel.  RCA system:  No significant disease.  IMPRESSION: 1. Coronary artery calcium  score 90 Agatston units placing the patient in the 38th percentile for age and gender.  2.  Nonobstructive coronary disease.  3.  Small intramyocardial segment in the proximal LAD.  Dalton Mclean   Electronically  Signed By: Ezra Shuck M.D. On: 06/18/2017 18:15  Narrative EXAM: OVER-READ INTERPRETATION  CT CHEST  The following report is an over-read performed by radiologist Dr. Toribio Cove [facility] on 06/18/2017. This over-read does not include interpretation of cardiac or coronary anatomy or pathology. The coronary calcium  score/coronary CTA interpretation by the cardiologist is attached.  COMPARISON:  Chest CT 04/02/2011.  FINDINGS: Aortic atherosclerosis. Within the visualized portions of the thorax there are no suspicious appearing pulmonary nodules or masses, there is no acute consolidative airspace disease, no pleural effusions, no pneumothorax and no lymphadenopathy. Visualized portions of the upper abdomen are unremarkable. There are no aggressive appearing lytic or blastic lesions noted in the visualized portions of the skeleton.  IMPRESSION: 1.  Aortic Atherosclerosis (ICD10-I70.0).  Electronically Signed: By: Toribio Aye M.D. On: 06/18/2017 10:58     ______________________________________________________________________________________________      EKG:  EKG Interpretation Date/Time:  Wednesday February 13 2024 10:48:12 EDT Ventricular Rate:  39 PR Interval:  264 QRS Duration:  122 QT Interval:  498 QTC Calculation: 400 R Axis:   63  Text Interpretation: Marked sinus bradycardia with marked sinus arrhythmia with 1st degree A-V block Septal infarct , age undetermined Lateral infarct (cited on or before 27-Sep-2023) When compared with ECG of 13-Feb-2024 10:44, No significant change was found Confirmed by Wonda Sharper [phone]) on 02/13/2024 11:13:02 AM    Recent Labs: 08/14/2023: ALT 12 09/27/2023: BUN 20; Creatinine, Ser 1.34; Hemoglobin 12.5; Platelets 226; Potassium 4.0; Sodium 137  Recent Lipid Panel    Component Value Date/Time   CHOL 118 04/23/2023 1019   TRIG 56 04/23/2023 1019   HDL 45 04/23/2023 1019   CHOLHDL 2.6 04/23/2023  1019   CHOLHDL 4.1 02/27/2022 0129   VLDL 27 02/27/2022 0129   LDLCALC 60 04/23/2023 1019     Risk Assessment/Calculations:                Physical Exam:    VS:  BP 100/66   Pulse (!) 39   Ht 5' 6 (1.676 m)   Wt 163 lb 3.2 oz (74 kg)   SpO2 94%   BMI 26.34 kg/m     Wt Readings from Last 3 Encounters:  02/13/24 163 lb 3.2 oz (74 kg)  10/02/23 162 lb (73.5 kg)  09/27/23 162 lb (73.5 kg)     GEN:  Well nourished, well developed in no acute distress HEENT: Normal NECK: No JVD; No carotid bruits LYMPHATICS: No lymphadenopathy CARDIAC: bradycardic and regular, no murmurs, rubs, gallops RESPIRATORY:  Clear to auscultation without rales, wheezing or rhonchi  ABDOMEN: Soft, non-tender, non-distended MUSCULOSKELETAL:  No edema; No deformity  SKIN: Warm and dry NEUROLOGIC:  Alert and oriented x 3 PSYCHIATRIC:  Normal affect   Assessment & Plan Coronary artery disease involving native coronary artery of native heart with angina pectoris (HCC)  Mixed hyperlipidemia Last lipids 2024 showed cholesterol 118, LDL 60, HDL 45, triglycerides 56.  Continue current management.  Lipids to be drawn at PCP visit in near future.  Treated with rosuvastatin  20 mg daily. Bradycardia Patient with marked sinus bradycardia and sinus arrhythmia, heart rate around 40 bpm.  No concerning symptoms at this time.  Had a lengthy discussion with the patient about treatment options.  He has seen EP in the past and has declined pacemaker implantation.  He understands to keep a close eye out for progressive fatigue, near syncope, or frank syncope.  He is obviously on no AV nodal blocking agents. HFrEF (heart failure with reduced ejection fraction) (HCC) Last echo 05/31/2023 shows preserved LVEF 60 to 65%, normal RV function, aortic sclerosis without stenosis, and grade 1 diastolic dysfunction.  Currently at a good functional capacity, NYHA functional class I symptoms. Palpitations Patient underwent a Zio  monitor last year showing an average heart rate of 44 bpm with no pathologic pauses greater than 3 seconds and no high-grade AV block.  He has previously been referred to EP and has discussed permanent pacemaker placement but has declined this.  PVC burden was less than 1%      Informed Consent   Shared Decision Making/Informed Consent The risks [chest pain, shortness of breath, cardiac arrhythmias, dizziness, blood pressure fluctuations, myocardial infarction, stroke/transient ischemic attack, and life-threatening complications (estimated to be 1 in 10,000)], benefits (risk stratification, diagnosing coronary artery disease, treatment guidance) and alternatives of an exercise tolerance test were discussed in  detail with Dr. Scarboro and he agrees to proceed.       Medication Adjustments/Labs and Tests Ordered: Current medicines are reviewed at length with the patient today.  Concerns regarding medicines are outlined above.  Orders Placed This Encounter  Procedures   EXERCISE TOLERANCE TEST (ETT)   EKG 12-Lead   EKG 12-Lead   No orders of the defined types were placed in this encounter.   Patient Instructions  Medication Instructions:  Your physician recommends that you continue on your current medications as directed. Please refer to the Current Medication list given to you today.  *If you need a refill on your cardiac medications before your next appointment, please call your pharmacy*  Lab Work: None ordered.  You may go to any [facility] Location for your lab work:  [facility] - [id number] ([facility]) - [address] [phone] [address]  [place] - [address]   [place]  - [address]   [place] - [address] - [address] [employer]  - [address] [place] - [address] ([facility]   If you have labs (blood work) drawn today and your tests  are completely normal, you will receive your results only by: [employer] (if you have MyChart)  If you have any lab test that is abnormal or we need to change your treatment, we will call you or send a MyChart message to review the results.  Testing/Procedures: Your physician has requested that you have an exercise tolerance test. For further information please visit [email or web address] Please also follow instruction sheet, as given.   Follow-Up: At [facility], you and your health needs are our priority.  As part of our continuing mission to provide you with exceptional heart care, we have created designated Provider Care Teams.  These Care Teams include your primary Cardiologist (physician) and Advanced Practice Providers (APPs -  Physician Assistants and Nurse Practitioners) who all work together to provide you with the care you need, when you need it.   Your next appointment:   6 months  The format for your next appointment:   In Person  Provider:   Ozell Fell, MD         Signed, Nathaniel Fell, MD  02/13/2024 1:03 PM    [facility]

## 2024-02-13 NOTE — Assessment & Plan Note (Signed)
 Patient underwent a Zio monitor last year showing an average heart rate of 44 bpm with no pathologic pauses greater than 3 seconds and no high-grade AV block.  He has previously been referred to EP and has discussed permanent pacemaker placement but has declined this.  PVC burden was less than 1%

## 2024-02-13 NOTE — Patient Instructions (Addendum)
 Medication Instructions:  Your physician recommends that you continue on your current medications as directed. Please refer to the Current Medication list given to you today.  *If you need a refill on your cardiac medications before your next appointment, please call your pharmacy*  Lab Work: None ordered.  You may go to any [facility] Location for your lab work:  [facility] - [address] ([facility]) - [address] [phone] [address]  [place] - [address]   [place]  - [address]   [place] - [address] - [address] [employer]  - [address] [place] - [address] ([facility]   If you have labs (blood work) drawn today and your tests are completely normal, you will receive your results only by: [employer] (if you have MyChart)  If you have any lab test that is abnormal or we need to change your treatment, we will call you or send a MyChart message to review the results.  Testing/Procedures: Your physician has requested that you have an exercise tolerance test. For further information please visit [email or web address] Please also follow instruction sheet, as given.   Follow-Up: At [facility], you and your health needs are our priority.  As part of our continuing mission to provide you with exceptional heart care, we have created designated Provider Care Teams.  These Care Teams include your primary Cardiologist (physician) and Advanced Practice Providers (APPs -  Physician Assistants and Nurse Practitioners) who all work together to provide you with the care you need, when you need it.   Your next appointment:   6 months  The format for your next appointment:   In Person  Provider:   Ozell Fell, MD

## 2024-02-14 ENCOUNTER — Encounter (HOSPITAL_COMMUNITY): Payer: Self-pay | Admitting: *Deleted

## 2024-02-14 ENCOUNTER — Other Ambulatory Visit: Payer: Self-pay | Admitting: Cardiovascular Disease

## 2024-02-14 DIAGNOSIS — H903 Sensorineural hearing loss, bilateral: Secondary | ICD-10-CM | POA: Diagnosis not present

## 2024-02-14 DIAGNOSIS — H6123 Impacted cerumen, bilateral: Secondary | ICD-10-CM | POA: Diagnosis not present

## 2024-02-14 DIAGNOSIS — H9313 Tinnitus, bilateral: Secondary | ICD-10-CM | POA: Diagnosis not present

## 2024-02-15 DIAGNOSIS — M79661 Pain in right lower leg: Secondary | ICD-10-CM | POA: Diagnosis not present

## 2024-02-15 DIAGNOSIS — I872 Venous insufficiency (chronic) (peripheral): Secondary | ICD-10-CM | POA: Diagnosis not present

## 2024-02-18 ENCOUNTER — Ambulatory Visit: Payer: Self-pay | Admitting: Oncology

## 2024-02-18 ENCOUNTER — Inpatient Hospital Stay: Attending: Oncology

## 2024-02-18 DIAGNOSIS — Z862 Personal history of diseases of the blood and blood-forming organs and certain disorders involving the immune mechanism: Secondary | ICD-10-CM

## 2024-02-18 DIAGNOSIS — D649 Anemia, unspecified: Secondary | ICD-10-CM | POA: Insufficient documentation

## 2024-02-18 LAB — CMP (CANCER CENTER ONLY)
ALT: 13 U/L (ref 0–44)
AST: 20 U/L (ref 15–41)
Albumin: 4.4 g/dL (ref 3.5–5.0)
Alkaline Phosphatase: 57 U/L (ref 38–126)
Anion gap: 11 (ref 5–15)
BUN: 20 mg/dL (ref 8–23)
CO2: 25 mmol/L (ref 22–32)
Calcium: 9.7 mg/dL (ref 8.9–10.3)
Chloride: 104 mmol/L (ref 98–111)
Creatinine: 1.43 mg/dL — ABNORMAL HIGH (ref 0.61–1.24)
GFR, Estimated: 50 mL/min — ABNORMAL LOW (ref >60)
Glucose, Bld: 88 mg/dL (ref 70–99)
Potassium: 4.4 mmol/L (ref 3.5–5.1)
Sodium: 139 mmol/L (ref 135–145)
Total Bilirubin: 0.4 mg/dL (ref 0.0–1.2)
Total Protein: 6.6 g/dL (ref 6.5–8.1)

## 2024-02-18 LAB — CBC WITH DIFFERENTIAL (CANCER CENTER ONLY)
Abs Immature Granulocytes: 0 K/uL (ref 0.00–0.07)
Basophils Absolute: 0 K/uL (ref 0.0–0.1)
Basophils Relative: 1 %
Eosinophils Absolute: 0.1 K/uL (ref 0.0–0.5)
Eosinophils Relative: 3 %
HCT: 39.8 % (ref 39.0–52.0)
Hemoglobin: 13.1 g/dL (ref 13.0–17.0)
Immature Granulocytes: 0 %
Lymphocytes Relative: 18 %
Lymphs Abs: 0.6 K/uL — ABNORMAL LOW (ref 0.7–4.0)
MCH: 29.4 pg (ref 26.0–34.0)
MCHC: 32.9 g/dL (ref 30.0–36.0)
MCV: 89.4 fL (ref 80.0–100.0)
Monocytes Absolute: 0.5 K/uL (ref 0.1–1.0)
Monocytes Relative: 15 %
Neutro Abs: 2.1 K/uL (ref 1.7–7.7)
Neutrophils Relative %: 63 %
Platelet Count: 204 K/uL (ref 150–400)
RBC: 4.45 MIL/uL (ref 4.22–5.81)
RDW: 13.6 % (ref 11.5–15.5)
WBC Count: 3.3 K/uL — ABNORMAL LOW (ref 4.0–10.5)
nRBC: 0 % (ref 0.0–0.2)

## 2024-02-19 ENCOUNTER — Other Ambulatory Visit: Payer: Self-pay | Admitting: Cardiovascular Disease

## 2024-02-22 ENCOUNTER — Ambulatory Visit (HOSPITAL_COMMUNITY)
Admission: RE | Admit: 2024-02-22 | Discharge: 2024-02-22 | Disposition: A | Discharge status: Home / Self Care | Source: Ambulatory Visit | Attending: Cardiovascular Disease | Admitting: Cardiovascular Disease

## 2024-02-22 DIAGNOSIS — E782 Mixed hyperlipidemia: Secondary | ICD-10-CM | POA: Insufficient documentation

## 2024-02-22 DIAGNOSIS — R001 Bradycardia, unspecified: Secondary | ICD-10-CM | POA: Insufficient documentation

## 2024-02-22 DIAGNOSIS — I25119 Atherosclerotic heart disease of native coronary artery with unspecified angina pectoris: Secondary | ICD-10-CM | POA: Diagnosis not present

## 2024-02-22 DIAGNOSIS — I5020 Unspecified systolic (congestive) heart failure: Secondary | ICD-10-CM | POA: Diagnosis not present

## 2024-02-22 DIAGNOSIS — R002 Palpitations: Secondary | ICD-10-CM | POA: Insufficient documentation

## 2024-02-22 LAB — EXERCISE TOLERANCE TEST
Angina Index: 0
Duke Treadmill Score: 9
Estimated workload: 10.1
Exercise duration (min): 9 min
Exercise duration (sec): 0 s
MPHR: 141 {beats}/min
Peak HR: 118 {beats}/min
Percent HR: 83 %
RPE: 18
Rest HR: 41 {beats}/min
ST Depression (mm): 0 mm

## 2024-02-24 ENCOUNTER — Ambulatory Visit: Payer: Self-pay | Admitting: Cardiovascular Disease

## 2024-02-25 DIAGNOSIS — L905 Scar conditions and fibrosis of skin: Secondary | ICD-10-CM | POA: Diagnosis not present

## 2024-02-25 DIAGNOSIS — D692 Other nonthrombocytopenic purpura: Secondary | ICD-10-CM | POA: Diagnosis not present

## 2024-02-25 DIAGNOSIS — Z85828 Personal history of other malignant neoplasm of skin: Secondary | ICD-10-CM | POA: Diagnosis not present

## 2024-02-25 DIAGNOSIS — L111 Transient acantholytic dermatosis [Grover]: Secondary | ICD-10-CM | POA: Diagnosis not present

## 2024-02-25 DIAGNOSIS — L570 Actinic keratosis: Secondary | ICD-10-CM | POA: Diagnosis not present

## 2024-02-25 DIAGNOSIS — L820 Inflamed seborrheic keratosis: Secondary | ICD-10-CM | POA: Diagnosis not present

## 2024-02-25 DIAGNOSIS — L821 Other seborrheic keratosis: Secondary | ICD-10-CM | POA: Diagnosis not present

## 2024-02-27 DIAGNOSIS — M25531 Pain in right wrist: Secondary | ICD-10-CM | POA: Diagnosis not present

## 2024-03-06 DIAGNOSIS — I2510 Atherosclerotic heart disease of native coronary artery without angina pectoris: Secondary | ICD-10-CM | POA: Diagnosis not present

## 2024-03-06 DIAGNOSIS — Z0000 Encounter for general adult medical examination without abnormal findings: Secondary | ICD-10-CM | POA: Diagnosis not present

## 2024-03-06 DIAGNOSIS — E7800 Pure hypercholesterolemia, unspecified: Secondary | ICD-10-CM | POA: Diagnosis not present

## 2024-03-06 DIAGNOSIS — R5383 Other fatigue: Secondary | ICD-10-CM | POA: Diagnosis not present

## 2024-03-06 DIAGNOSIS — I872 Venous insufficiency (chronic) (peripheral): Secondary | ICD-10-CM | POA: Diagnosis not present

## 2024-03-06 DIAGNOSIS — D649 Anemia, unspecified: Secondary | ICD-10-CM | POA: Diagnosis not present

## 2024-03-06 DIAGNOSIS — K219 Gastro-esophageal reflux disease without esophagitis: Secondary | ICD-10-CM | POA: Diagnosis not present

## 2024-03-06 DIAGNOSIS — L111 Transient acantholytic dermatosis [Grover]: Secondary | ICD-10-CM | POA: Diagnosis not present

## 2024-03-06 DIAGNOSIS — N1831 Chronic kidney disease, stage 3a: Secondary | ICD-10-CM | POA: Diagnosis not present

## 2024-03-10 DIAGNOSIS — E7800 Pure hypercholesterolemia, unspecified: Secondary | ICD-10-CM | POA: Diagnosis not present

## 2024-03-12 DIAGNOSIS — L0889 Other specified local infections of the skin and subcutaneous tissue: Secondary | ICD-10-CM | POA: Diagnosis not present

## 2024-03-12 DIAGNOSIS — L738 Other specified follicular disorders: Secondary | ICD-10-CM | POA: Diagnosis not present

## 2024-03-14 DIAGNOSIS — H5213 Myopia, bilateral: Secondary | ICD-10-CM | POA: Diagnosis not present

## 2024-03-14 DIAGNOSIS — H52203 Unspecified astigmatism, bilateral: Secondary | ICD-10-CM | POA: Diagnosis not present

## 2024-03-14 DIAGNOSIS — H2513 Age-related nuclear cataract, bilateral: Secondary | ICD-10-CM | POA: Diagnosis not present

## 2024-03-19 DIAGNOSIS — L299 Pruritus, unspecified: Secondary | ICD-10-CM | POA: Diagnosis not present

## 2024-03-19 DIAGNOSIS — I87391 Chronic venous hypertension (idiopathic) with other complications of right lower extremity: Secondary | ICD-10-CM | POA: Diagnosis not present

## 2024-03-19 DIAGNOSIS — I872 Venous insufficiency (chronic) (peripheral): Secondary | ICD-10-CM | POA: Diagnosis not present

## 2024-03-19 DIAGNOSIS — R600 Localized edema: Secondary | ICD-10-CM | POA: Diagnosis not present

## 2024-03-19 DIAGNOSIS — R252 Cramp and spasm: Secondary | ICD-10-CM | POA: Diagnosis not present

## 2024-05-12 ENCOUNTER — Other Ambulatory Visit: Payer: Self-pay | Admitting: Cardiovascular Disease

## 2024-05-15 ENCOUNTER — Telehealth: Payer: Self-pay | Admitting: Cardiovascular Disease

## 2024-05-15 MED ORDER — AMLODIPINE BESYLATE 2.5 MG PO TABS: 2.5000 mg | ORAL_TABLET | Freq: Every day | ORAL | 2 refills | Status: AC

## 2024-05-15 MED ORDER — ROSUVASTATIN CALCIUM 20 MG PO TABS: 20.0000 mg | ORAL_TABLET | Freq: Every day | ORAL | 2 refills | Status: AC

## 2024-05-15 NOTE — Telephone Encounter (Signed)
*  STAT* If patient is at the pharmacy, call can be transferred to refill team.   1. Which medications need to be refilled? (please list name of each medication and dose if known) amLODipine  (NORVASC ) 2.5 MG tablet   rosuvastatin  (CRESTOR ) 20 MG tablet    2. Which pharmacy/location (including street and city if local pharmacy) is medication to be sent to?  [id number] - [place], [place] - [address]      3. Do they need a 30 day or 90 day supply? 90 day

## 2024-05-15 NOTE — Telephone Encounter (Signed)
 Pt's medications were sent to pt's pharmacy as requested. Confirmation received.

## 2024-05-22 ENCOUNTER — Encounter: Payer: Self-pay | Admitting: Cardiovascular Disease

## 2024-05-23 ENCOUNTER — Other Ambulatory Visit: Payer: Self-pay | Admitting: Cardiovascular Disease

## 2024-05-23 ENCOUNTER — Telehealth: Payer: Self-pay | Admitting: Cardiovascular Disease

## 2024-05-23 MED ORDER — CLOPIDOGREL BISULFATE 75 MG PO TABS: 75.0000 mg | ORAL_TABLET | Freq: Every day | ORAL | 2 refills | Status: AC

## 2024-05-23 NOTE — Telephone Encounter (Signed)
 Returned call to the pharmacy, they cannot find the refill that was sent in 02/19/2024.  Called in a verbal refill for Plavix  75 mg daily, #90 with 2 refills.   Spoke with [place].

## 2024-05-23 NOTE — Telephone Encounter (Signed)
*  STAT* If patient is at the pharmacy, call can be transferred to refill team.   1. Which medications need to be refilled? (please list name of each medication and dose if known) clopidogrel (PLAVIX) 75 MG tablet  2. Which pharmacy/location (including street and city if local pharmacy) is medication to be sent to? [id number] - [place], [place] - [address]  3. Do they need a 30 day or 90 day supply? 90

## 2024-07-02 ENCOUNTER — Encounter: Payer: Self-pay | Admitting: Cardiovascular Disease

## 2024-08-18 ENCOUNTER — Ambulatory Visit: Admitting: Oncology

## 2024-08-18 ENCOUNTER — Other Ambulatory Visit

## 2024-09-15 ENCOUNTER — Ambulatory Visit: Admitting: Cardiovascular Disease

## 2024-09-29 ENCOUNTER — Ambulatory Visit: Admitting: Cardiovascular Disease
